# Patient Record
Sex: Male | Born: 1950 | Race: White | Hispanic: No | Marital: Married | Smoking: Former smoker
Health system: Southern US, Community
[De-identification: ages and names within clinical notes are randomized; demographics above are authoritative.]

## PROBLEM LIST (undated history)

## (undated) DIAGNOSIS — E785 Hyperlipidemia, unspecified: Secondary | ICD-10-CM

## (undated) DIAGNOSIS — H409 Unspecified glaucoma: Secondary | ICD-10-CM

## (undated) DIAGNOSIS — G8929 Other chronic pain: Secondary | ICD-10-CM

## (undated) DIAGNOSIS — H269 Unspecified cataract: Secondary | ICD-10-CM

## (undated) DIAGNOSIS — I739 Peripheral vascular disease, unspecified: Secondary | ICD-10-CM

## (undated) DIAGNOSIS — N189 Chronic kidney disease, unspecified: Secondary | ICD-10-CM

## (undated) DIAGNOSIS — I10 Essential (primary) hypertension: Secondary | ICD-10-CM

## (undated) DIAGNOSIS — Z8709 Personal history of other diseases of the respiratory system: Secondary | ICD-10-CM

## (undated) DIAGNOSIS — M549 Dorsalgia, unspecified: Secondary | ICD-10-CM

## (undated) DIAGNOSIS — R351 Nocturia: Secondary | ICD-10-CM

## (undated) HISTORY — DX: Essential (primary) hypertension: I10

## (undated) HISTORY — DX: Chronic kidney disease, unspecified: N18.9

## (undated) HISTORY — DX: Unspecified glaucoma: H40.9

## (undated) HISTORY — DX: Hyperlipidemia, unspecified: E78.5

---

## 2007-06-19 ENCOUNTER — Encounter: Admission: RE | Admit: 2007-06-19 | Discharge: 2007-06-19 | Payer: Self-pay | Admitting: Interventional Cardiology

## 2014-09-17 ENCOUNTER — Other Ambulatory Visit: Payer: Self-pay | Admitting: Family Medicine

## 2014-09-17 DIAGNOSIS — I739 Peripheral vascular disease, unspecified: Secondary | ICD-10-CM

## 2014-09-24 ENCOUNTER — Ambulatory Visit
Admission: RE | Admit: 2014-09-24 | Discharge: 2014-09-24 | Disposition: A | Discharge status: Home / Self Care | Payer: Self-pay | Source: Ambulatory Visit | Attending: Family Medicine | Admitting: Family Medicine

## 2014-09-24 DIAGNOSIS — I739 Peripheral vascular disease, unspecified: Secondary | ICD-10-CM

## 2014-10-16 ENCOUNTER — Encounter: Payer: Self-pay | Admitting: Vascular Surgery

## 2014-10-16 ENCOUNTER — Other Ambulatory Visit: Payer: Self-pay

## 2014-10-16 DIAGNOSIS — I739 Peripheral vascular disease, unspecified: Secondary | ICD-10-CM

## 2014-10-29 ENCOUNTER — Encounter: Payer: Self-pay | Admitting: Vascular Surgery

## 2014-11-01 ENCOUNTER — Encounter: Payer: Self-pay | Admitting: Vascular Surgery

## 2014-11-01 ENCOUNTER — Ambulatory Visit (HOSPITAL_COMMUNITY)
Admission: RE | Admit: 2014-11-01 | Discharge: 2014-11-01 | Disposition: A | Discharge status: Home / Self Care | Payer: 59 | Source: Ambulatory Visit | Attending: Vascular Surgery | Admitting: Vascular Surgery

## 2014-11-01 ENCOUNTER — Ambulatory Visit (INDEPENDENT_AMBULATORY_CARE_PROVIDER_SITE_OTHER): Payer: 59 | Admitting: Vascular Surgery

## 2014-11-01 VITALS — BP 147/80 | HR 94 | Ht 65.0 in | Wt 203.0 lb

## 2014-11-01 DIAGNOSIS — I739 Peripheral vascular disease, unspecified: Secondary | ICD-10-CM

## 2014-11-01 DIAGNOSIS — I70219 Atherosclerosis of native arteries of extremities with intermittent claudication, unspecified extremity: Secondary | ICD-10-CM

## 2014-11-01 NOTE — Progress Notes (Signed)
Referred by:  Johny Blamer, MD [id number] W. [address] [place], [place] [address]  Reason for referral: Left calf cramping  History of Present Illness  Robert [name] is a 64 y.o. (February 21, 1951) male who presents with chief complaint: left calf cramping.  Onset of symptom occurred years ago, without obvious trigger.  Patient initial attributed his leg pain to a herniated disc.  Pain is described as cramping in left calf with 100-150 yd walking, severity 3-6/10.  Patient has attempted to treat this pain with rest.  The patient has no rest pain symptoms also and no leg wounds/ulcers.  His intermittent claudication does not limit his ADL but he does feel it limits his quality of life.  Atherosclerotic risk factors include: glucose intolerance, HLD, HTN, and prior smoking.  Past Medical History  Diagnosis Date  . Hyperglycemia   . Hyperlipidemia   . Chronic kidney disease     Stage III  Moderate  . Dorsalgia   . Glaucoma   . Hypertension     Past Surgical History: no surgeries noted.  History   Social History  . Marital Status: Married    Spouse Name: N/A  . Number of Children: N/A  . Years of Education: N/A   Occupational History  . Not on file.   Social History Main Topics  . Smoking status: Former Smoker    Quit date: 08/15/2004  . Smokeless tobacco: Never Used  . Alcohol Use: 9.0 oz/week    15 Cans of beer per week  . Drug Use: No  . Sexual Activity: Not on file   Other Topics Concern  . Not on file   Social History Narrative    Family History  Problem Relation Age of Onset  . Hyperlipidemia Father   . Peripheral vascular disease Father     amputation  . Cancer Brother   . Hyperlipidemia Brother     Current Outpatient Prescriptions on File Prior to Visit  Medication Sig Dispense Refill  . aspirin 81 MG tablet Take 81 mg by mouth daily.    Marland Kitchen ezetimibe-simvastatin (VYTORIN) 10-40 MG per tablet Take 1 tablet by mouth daily.    . fenofibrate (TRICOR)  145 MG tablet Take 145 mg by mouth daily.    . traMADol (ULTRAM) 50 MG tablet Take by mouth 2 (two) times daily. For PAIN   PRN    . Travoprost, BAK Free, (TRAVATAN Z) 0.004 % SOLN ophthalmic solution Place 1 drop into both eyes at bedtime.    . valsartan-hydrochlorothiazide (DIOVAN-HCT) 320-25 MG per tablet Take 1 tablet by mouth daily.    . ciclopirox (LOPROX) 0.77 % cream Apply topically 2 (two) times daily. Gel     No current facility-administered medications on file prior to visit.    No Known Allergies  REVIEW OF SYSTEMS:  (Positives checked otherwise negative)  CARDIOVASCULAR:   chest pain,  chest pressure,  palpitations,  shortness of breath when laying flat,  shortness of breath with exertion,    pain in feet when walking,  pain in feet when laying flat,  history of blood clot in veins (DVT),  history of phlebitis,  swelling in legs,  varicose veins  PULMONARY:   productive cough,  asthma,  wheezing  NEUROLOGIC:   weakness in arms or legs,  numbness in arms or legs,  difficulty speaking or slurred speech,  temporary loss of  vision in one eye, [ ]  dizziness  HEMATOLOGIC:  [ ]  bleeding problems, [ ]  problems with blood clotting too easily  MUSCULOSKEL:  [ ]  joint pain, [ ]  joint swelling  GASTROINTEST:  [ ]   Vomiting blood, [ ]   Blood in stool     GENITOURINARY:  [ ]   Burning with urination, [ ]   Blood in urine  PSYCHIATRIC:  [ ]  history of major depression  INTEGUMENTARY:  [ ]  rashes, [ ]  ulcers  CONSTITUTIONAL:  [ ]  fever, [ ]  chills  For VQI Use Only  PRE-ADM LIVING: Home  AMB STATUS: Ambulatory  CAD Sx: None  PRIOR CHF: None  STRESS TEST: [x]  No, [ ]  Normal, [ ]  + ischemia, [ ]  + MI, [ ]  Both   Physical Examination Filed Vitals:   11/01/14 1341 11/01/14 1353  BP: 145/77 147/80  Pulse: 94   Height: 5\' 5"  (1.651 m)   Weight: 203 lb (92.08 kg)   SpO2: 97%    Body mass index is 33.78  kg/(m^2).  General: A&O x 3, WDWN  Head: [place]/AT  Ear/Nose/Throat: Hearing grossly intact, nares w/o erythema or drainage, oropharynx w/o Erythema/Exudate  Eyes: PERRLA, EOMI  Neck: Supple, no nuchal rigidity, no palpable LAD  Pulmonary: Sym exp, good air movt, CTAB, no rales, rhonchi, & wheezing  Cardiac: RRR, Nl S1, S2, no Murmurs, rubs or gallops  Vascular: Vessel Right Left  Radial Palpable Palpable  Brachial  Palpable Palpable  Carotid Palpable, without bruit Palpable, without bruit  Aorta Not palpable N/A  Femoral Palpable Faintly Palpable  Popliteal Not palpable Not palpable  PT Not Palpable Not Palpable  DP Faintly Palpable Not Palpable   Gastrointestinal: soft, NTND, -G/R, - HSM, - masses, - CVAT B  Musculoskeletal: M/S 5/5 throughout , Extremities without ischemic changes , spider veins BLE  Neurologic: CN 2-12 intact , Pain and light touch intact in extremities , Motor exam as listed above  Psychiatric: Judgment intact, Mood & affect appropriatefor pt's clinical situation  Dermatologic: See M/S exam for extremity exam, no rashes otherwise noted  Lymph : No Cervical, Axillary, or Inguinal lymphadenopathy   Non-Invasive Vascular Imaging  ABI (Date: 11/01/2014)  R: 0.77, DP: mono, PT: barely biphasic, TBI: 0.40  L: 0.44, DP: mono, PT: mono, TBI: 0.16  Outside Studies/Documentation 4 pages of outside documents were reviewed including: outpatient PCP chart.  Medical Decision Making  Robert [name] is a 64 y.o. male who presents with: BLE (L>R) intermittent claudication.   I discussed with the patient the natural history of intermittent claudication: 75% of patients have stable or improved symptoms in a year an only 2% require amputation. Eventually 20% may require intervention in a year.  I discussed in depth with the patient the nature of atherosclerosis, and emphasized the importance of maximal medical management including strict control of blood  pressure, blood glucose, and lipid levels, antiplatelet agent, obtaining regular exercise, and cessation of smoking.    The patient is aware that without maximal medical management the underlying atherosclerotic disease process will progress, limiting the benefit of any interventions.  I discussed in depth with the patient a walking plan and how to execute such.  The patient is not interested in starting Pletal. The patient is currently on a statin: Vytorin. The patient is currently on an anti-platelet: ASA.  The patient will follow up in 3 months with the following studies BLE ABI.    We discussed the signs and sx of CLI.  He  will follow up with [place] immediately if he develops any of these.  Thank you for allowing [place] to participate in this patient's care.  Leonides Sake, MD [facility] Office: [phone] Pager: [phone]  11/01/2014, 2:36 PM

## 2015-02-07 ENCOUNTER — Encounter (HOSPITAL_COMMUNITY): Payer: 59

## 2015-02-07 ENCOUNTER — Ambulatory Visit: Payer: 59 | Admitting: Vascular Surgery

## 2015-02-14 ENCOUNTER — Encounter (HOSPITAL_COMMUNITY): Payer: 59

## 2015-02-14 ENCOUNTER — Ambulatory Visit: Payer: 59 | Admitting: Vascular Surgery

## 2015-02-25 ENCOUNTER — Encounter: Payer: Self-pay | Admitting: Vascular Surgery

## 2015-02-25 ENCOUNTER — Other Ambulatory Visit: Payer: Self-pay | Admitting: *Deleted

## 2015-02-25 DIAGNOSIS — I70219 Atherosclerosis of native arteries of extremities with intermittent claudication, unspecified extremity: Secondary | ICD-10-CM

## 2015-02-28 ENCOUNTER — Encounter: Payer: Self-pay | Admitting: Vascular Surgery

## 2015-02-28 ENCOUNTER — Ambulatory Visit (INDEPENDENT_AMBULATORY_CARE_PROVIDER_SITE_OTHER): Payer: 59 | Admitting: Vascular Surgery

## 2015-02-28 ENCOUNTER — Other Ambulatory Visit: Payer: Self-pay

## 2015-02-28 ENCOUNTER — Ambulatory Visit (HOSPITAL_COMMUNITY)
Admission: RE | Admit: 2015-02-28 | Discharge: 2015-02-28 | Disposition: A | Discharge status: Home / Self Care | Payer: 59 | Source: Ambulatory Visit | Attending: Vascular Surgery | Admitting: Vascular Surgery

## 2015-02-28 VITALS — BP 147/85 | HR 84 | Temp 98.2°F | Resp 16 | Ht 66.0 in | Wt 207.0 lb

## 2015-02-28 DIAGNOSIS — I70219 Atherosclerosis of native arteries of extremities with intermittent claudication, unspecified extremity: Secondary | ICD-10-CM | POA: Diagnosis not present

## 2015-02-28 DIAGNOSIS — I70212 Atherosclerosis of native arteries of extremities with intermittent claudication, left leg: Secondary | ICD-10-CM | POA: Diagnosis not present

## 2015-02-28 NOTE — Progress Notes (Signed)
Established Intermittent Claudication  History of Present Illness  Robert [name] is a 64 y.o. ([birth year]) male who presents with chief complaint: left leg intermittent claudication .  The patient's symptoms have progressed.  The patient's symptoms are: short distance claudication.  He can only get 100 ft before he cramp significantly.  The patient's treatment regimen currently included: maximal medical management.  The patient is not able to comply with walking plan.  Past Medical History  Diagnosis Date  . Hyperglycemia   . Hyperlipidemia   . Chronic kidney disease     Stage III  Moderate  . Dorsalgia   . Glaucoma   . Hypertension     History reviewed. No pertinent past surgical history.  Social History   Social History  . Marital Status: Married    Spouse Name: N/A  . Number of Children: N/A  . Years of Education: N/A   Occupational History  . Not on file.   Social History Main Topics  . Smoking status: Former Smoker    Quit date: 08/15/2004  . Smokeless tobacco: Never Used  . Alcohol Use: 9.0 oz/week    15 Cans of beer per week  . Drug Use: No  . Sexual Activity: Not on file   Other Topics Concern  . Not on file   Social History Narrative    Family History  Problem Relation Age of Onset  . Hyperlipidemia Father   . Peripheral vascular disease Father     amputation  . Cancer Brother   . Hyperlipidemia Brother     Current Outpatient Prescriptions  Medication Sig Dispense Refill  . aspirin 81 MG tablet Take 81 mg by mouth daily.    . ciclopirox (LOPROX) 0.77 % cream Apply topically 2 (two) times daily. Gel    . Ciclopirox 0.77 % gel     . ezetimibe-simvastatin (VYTORIN) 10-40 MG per tablet Take 1 tablet by mouth daily.    . fenofibrate (TRICOR) 145 MG tablet Take 145 mg by mouth daily.    . traMADol (ULTRAM) 50 MG tablet Take by mouth 2 (two) times daily. For PAIN   PRN    . Travoprost, BAK Free, (TRAVATAN Z) 0.004 % SOLN ophthalmic solution Place  1 drop into both eyes at bedtime.    . travoprost, benzalkonium, (TRAVATAN) 0.004 % ophthalmic solution     . valsartan-hydrochlorothiazide (DIOVAN-HCT) 320-25 MG per tablet Take 1 tablet by mouth daily.     No current facility-administered medications for this visit.     No Known Allergies   REVIEW OF SYSTEMS:  (Positives checked otherwise negative)  CARDIOVASCULAR:   [ ]  chest pain,  [ ]  chest pressure,  [ ]  palpitations,  [ ]  shortness of breath when laying flat,  [ ]  shortness of breath with exertion,   [x]  pain in feet when walking,  [ ]  pain in feet when laying flat, [ ]  history of blood clot in veins (DVT),  [ ]  history of phlebitis,  [ ]  swelling in legs,  [ ]  varicose veins  PULMONARY:   [ ]  productive cough,  [ ]  asthma,  [ ]  wheezing  NEUROLOGIC:   [ ]  weakness in arms or legs,  [ ]  numbness in arms or legs,  [ ]  difficulty speaking or slurred speech,  [ ]  temporary loss of vision in one eye,  [ ]  dizziness  HEMATOLOGIC:   [ ]  bleeding problems,  [ ]  problems with blood clotting too easily  MUSCULOSKEL:   [ ]   joint pain, [ ] joint swelling  GASTROINTEST:   [ ] vomiting blood,  [ ] blood in stool     GENITOURINARY:   [ ] burning with urination,  [ ] blood in urine  PSYCHIATRIC:   [ ] history of major depression  INTEGUMENTARY:   [ ] rashes,  [ ] ulcers  CONSTITUTIONAL:   [ ] fever,  [ ] chills     Physical Examination  Filed Vitals:   02/28/15 1203 02/28/15 1206  BP: 154/79 147/85  Pulse: 82 84  Temp: 98.2 F (36.8 C)   Resp: 16   Height: 5' 6" (1.676 m)   Weight: 207 lb (93.895 kg)   SpO2: 98%    Body mass index is 33.43 kg/(m^2).  General: A&O x 3, WDWN  Pulmonary: Sym exp, good air movt, CTAB, no rales, rhonchi, & wheezing  Cardiac: RRR, Nl S1, S2, no Murmurs, rubs or gallops  Vascular: Vessel Right Left  Radial Palpable Palpable  Brachial Palpable Palpable  Carotid Palpable, without bruit Palpable, without bruit    Aorta Not palpable N/A  Femoral Palpable Palpable  Popliteal Not palpable Not palpable  PT Palpable Not Palpable  DP Palpable Not Palpable   Gastrointestinal: soft, NTND, no G/R, no HSM, no masses, no CVAT B  Musculoskeletal: M/S 5/5 throughout , Extremities without ischemic changes   Neurologic: Pain and light touch intact in extremities , Motor exam as listed above   Non-Invasive Vascular Imaging ABI (Date: 02/28/2015)  R:   ABI: 0.84 (0.71),   DP: bi,   PT: bi,   TBI: 0.63  L:   ABI: 0.54 (0.39),   DP: mono,   PT: mono,   TBI: 0.32   Medical Decision Making  Robert [name] is a 64 y.o. male who presents with:  Left leg intermittent claudication without evidence of critical limb ischemia.   Based on the patient's vascular studies and examination, I have offered the patient: aortogram , left leg runoff, and possible intervention.  I discussed in depth with the patient the nature of atherosclerosis, and emphasized the importance of maximal medical management including strict control of blood pressure, blood glucose, and lipid levels, antiplatelet agents, obtaining regular exercise, and cessation of smoking.    The patient is aware that without maximal medical management the underlying atherosclerotic disease process will progress, limiting the benefit of any interventions. The patient is currently on a statin: Vytorin. The patient is currently on an anti-platelet: ASA.  Thank you for allowing us to participate in this patient's care.   Brian Chen, MD [facility] Office: [phone] Pager: [phone]  02/28/2015, 1:23 PM      

## 2015-02-28 NOTE — Progress Notes (Signed)
Filed Vitals:   02/28/15 1203 02/28/15 1206  BP: 154/79 147/85  Pulse: 82 84  Temp: 98.2 F (36.8 C)   Resp: 16   Height: 5\' 6"  (1.676 m)   Weight: 207 lb (93.895 kg)   SpO2: 98%

## 2015-03-13 ENCOUNTER — Encounter (HOSPITAL_COMMUNITY): Payer: Self-pay | Admitting: Vascular Surgery

## 2015-03-13 ENCOUNTER — Other Ambulatory Visit: Payer: Self-pay | Admitting: *Deleted

## 2015-03-13 ENCOUNTER — Encounter (HOSPITAL_COMMUNITY)
Admission: RE | Disposition: A | Discharge status: Home / Self Care | Payer: Self-pay | Source: Ambulatory Visit | Attending: Vascular Surgery

## 2015-03-13 ENCOUNTER — Ambulatory Visit (HOSPITAL_COMMUNITY)
Admission: RE | Admit: 2015-03-13 | Discharge: 2015-03-13 | Disposition: A | Discharge status: Home / Self Care | Payer: 59 | Source: Ambulatory Visit | Attending: Vascular Surgery | Admitting: Vascular Surgery

## 2015-03-13 DIAGNOSIS — I7092 Chronic total occlusion of artery of the extremities: Secondary | ICD-10-CM | POA: Diagnosis not present

## 2015-03-13 DIAGNOSIS — Z7982 Long term (current) use of aspirin: Secondary | ICD-10-CM | POA: Diagnosis not present

## 2015-03-13 DIAGNOSIS — I70212 Atherosclerosis of native arteries of extremities with intermittent claudication, left leg: Secondary | ICD-10-CM | POA: Insufficient documentation

## 2015-03-13 DIAGNOSIS — I739 Peripheral vascular disease, unspecified: Secondary | ICD-10-CM

## 2015-03-13 DIAGNOSIS — E785 Hyperlipidemia, unspecified: Secondary | ICD-10-CM | POA: Diagnosis not present

## 2015-03-13 DIAGNOSIS — Z87891 Personal history of nicotine dependence: Secondary | ICD-10-CM | POA: Insufficient documentation

## 2015-03-13 DIAGNOSIS — I129 Hypertensive chronic kidney disease with stage 1 through stage 4 chronic kidney disease, or unspecified chronic kidney disease: Secondary | ICD-10-CM | POA: Diagnosis not present

## 2015-03-13 DIAGNOSIS — Z01810 Encounter for preprocedural cardiovascular examination: Secondary | ICD-10-CM

## 2015-03-13 DIAGNOSIS — N183 Chronic kidney disease, stage 3 (moderate): Secondary | ICD-10-CM | POA: Insufficient documentation

## 2015-03-13 DIAGNOSIS — I70213 Atherosclerosis of native arteries of extremities with intermittent claudication, bilateral legs: Secondary | ICD-10-CM | POA: Diagnosis not present

## 2015-03-13 DIAGNOSIS — I70219 Atherosclerosis of native arteries of extremities with intermittent claudication, unspecified extremity: Secondary | ICD-10-CM | POA: Diagnosis present

## 2015-03-13 HISTORY — PX: PERIPHERAL VASCULAR CATHETERIZATION: SHX172C

## 2015-03-13 LAB — POCT I-STAT, CHEM 8
BUN: 24 mg/dL — ABNORMAL HIGH (ref 6–20)
CALCIUM ION: 1.27 mmol/L (ref 1.13–1.30)
CREATININE: 1.6 mg/dL — AB (ref 0.61–1.24)
Chloride: 100 mmol/L — ABNORMAL LOW (ref 101–111)
GLUCOSE: 117 mg/dL — AB (ref 65–99)
HCT: 38 % — ABNORMAL LOW (ref 39.0–52.0)
HEMOGLOBIN: 12.9 g/dL — AB (ref 13.0–17.0)
Potassium: 4 mmol/L (ref 3.5–5.1)
Sodium: 136 mmol/L (ref 135–145)
TCO2: 23 mmol/L (ref 0–100)

## 2015-03-13 LAB — POCT ACTIVATED CLOTTING TIME: ACTIVATED CLOTTING TIME: 134 s

## 2015-03-13 LAB — GLUCOSE, CAPILLARY: GLUCOSE-CAPILLARY: 116 mg/dL — AB (ref 65–99)

## 2015-03-13 SURGERY — ABDOMINAL AORTOGRAM

## 2015-03-13 MED ORDER — NITROGLYCERIN 1 MG/10 ML FOR IR/CATH LAB
INTRA_ARTERIAL | Status: AC
  Filled 2015-03-13: qty 10

## 2015-03-13 MED ORDER — HEPARIN SODIUM (PORCINE) 1000 UNIT/ML IJ SOLN
INTRAMUSCULAR | Status: AC
  Filled 2015-03-13: qty 1

## 2015-03-13 MED ORDER — FENTANYL CITRATE (PF) 100 MCG/2ML IJ SOLN
INTRAMUSCULAR | Status: DC | PRN
  Administered 2015-03-13: 50 ug via INTRAVENOUS

## 2015-03-13 MED ORDER — SODIUM CHLORIDE 0.9 % IV SOLN
INTRAVENOUS | Status: DC
  Administered 2015-03-13: 07:00:00 via INTRAVENOUS

## 2015-03-13 MED ORDER — LIDOCAINE HCL (PF) 1 % IJ SOLN
INTRAMUSCULAR | Status: DC | PRN
  Administered 2015-03-13: 08:00:00

## 2015-03-13 MED ORDER — IODIXANOL 320 MG/ML IV SOLN
INTRAVENOUS | Status: DC | PRN
  Administered 2015-03-13: 135 mL via INTRA_ARTERIAL

## 2015-03-13 MED ORDER — OXYCODONE-ACETAMINOPHEN 5-325 MG PO TABS: 1.0000 | ORAL_TABLET | Freq: Four times a day (QID) | ORAL | Status: DC | PRN

## 2015-03-13 MED ORDER — MORPHINE SULFATE (PF) 2 MG/ML IV SOLN: 2.0000 mg | INTRAVENOUS | Status: DC | PRN

## 2015-03-13 MED ORDER — NITROGLYCERIN 1 MG/10 ML FOR IR/CATH LAB
INTRA_ARTERIAL | Status: DC | PRN
  Administered 2015-03-13: 200 ug via INTRA_ARTERIAL

## 2015-03-13 MED ORDER — LIDOCAINE HCL (PF) 1 % IJ SOLN
INTRAMUSCULAR | Status: AC
  Filled 2015-03-13: qty 30

## 2015-03-13 MED ORDER — SODIUM CHLORIDE 0.9 % IV SOLN: 1.0000 mL/kg/h | INTRAVENOUS | Status: DC

## 2015-03-13 MED ORDER — HEPARIN SODIUM (PORCINE) 1000 UNIT/ML IJ SOLN
INTRAMUSCULAR | Status: DC | PRN
  Administered 2015-03-13: 2000 [IU] via INTRAVENOUS

## 2015-03-13 MED ORDER — HEPARIN (PORCINE) IN NACL 2-0.9 UNIT/ML-% IJ SOLN
INTRAMUSCULAR | Status: AC
  Filled 2015-03-13: qty 1000

## 2015-03-13 MED ORDER — MIDAZOLAM HCL 2 MG/2ML IJ SOLN
INTRAMUSCULAR | Status: AC
  Filled 2015-03-13: qty 2

## 2015-03-13 MED ORDER — HYDRALAZINE HCL 20 MG/ML IJ SOLN: 10.0000 mg | INTRAMUSCULAR | Status: DC | PRN

## 2015-03-13 MED ORDER — MIDAZOLAM HCL 2 MG/2ML IJ SOLN
INTRAMUSCULAR | Status: DC | PRN
  Administered 2015-03-13: 1 mg via INTRAVENOUS

## 2015-03-13 MED ORDER — FENTANYL CITRATE (PF) 100 MCG/2ML IJ SOLN
INTRAMUSCULAR | Status: AC
  Filled 2015-03-13: qty 2

## 2015-03-13 SURGICAL SUPPLY — 13 items
CATH ANGIO 5F PIGTAIL 100CM (CATHETERS) ×2 IMPLANT
CATH OMNI FLUSH 5F 65CM (CATHETERS) ×2 IMPLANT
COVER PRB 48X5XTLSCP FOLD TPE (BAG) IMPLANT
COVER PROBE 5X48 (BAG) ×6
KIT MICROINTRODUCER STIFF 5F (SHEATH) ×2 IMPLANT
KIT PV (KITS) ×3 IMPLANT
SHEATH PINNACLE 5F 10CM (SHEATH) ×2 IMPLANT
STOPCOCK MORSE 400PSI 3WAY (MISCELLANEOUS) ×4 IMPLANT
SYR MEDRAD MARK V 150ML (SYRINGE) ×3 IMPLANT
TRANSDUCER W/STOPCOCK (MISCELLANEOUS) ×3 IMPLANT
TRAY PV CATH (CUSTOM PROCEDURE TRAY) ×3 IMPLANT
TUBING HIGH PRESSURE 120CM (CONNECTOR) ×4 IMPLANT
WIRE BENTSON .035X145CM (WIRE) ×2 IMPLANT

## 2015-03-13 NOTE — Discharge Instructions (Signed)
Angiogram, Care After °Refer to this sheet in the next few weeks. These instructions provide you with information about caring for yourself after your procedure. Your health care provider may also give you more specific instructions. Your treatment has been planned according to current medical practices, but problems sometimes occur. Call your health care provider if you have any problems or questions after your procedure. °WHAT TO EXPECT AFTER THE PROCEDURE °After your procedure, it is typical to have the following: °· Bruising at the catheter insertion site that usually fades within 1-2 weeks. °· Blood collecting in the tissue (hematoma) that may be painful to the touch. It should usually decrease in size and tenderness within 1-2 weeks. °HOME CARE INSTRUCTIONS °· Take medicines only as directed by your health care provider. °· You may shower 24-48 hours after the procedure or as directed by your health care provider. Remove the bandage (dressing) and gently wash the site with plain soap and water. Pat the area dry with a clean towel. Do not rub the site, because this may cause bleeding. °· Do not take baths, swim, or use a hot tub until your health care provider approves. °· Check your insertion site every day for redness, swelling, or drainage. °· Do not apply powder or lotion to the site. °· Do not lift over 10 lb (4.5 kg) for 5 days after your procedure or as directed by your health care provider. °· Ask your health care provider when it is okay to: °¨ Return to work or school. °¨ Resume usual physical activities or sports. °¨ Resume sexual activity. °· Do not drive home if you are discharged the same day as the procedure. Have someone else drive you. °· You may drive 24 hours after the procedure unless otherwise instructed by your health care provider. °· Do not operate machinery or power tools for 24 hours after the procedure or as directed by your health care provider. °· If your procedure was done as an  outpatient procedure, which means that you went home the same day as your procedure, a responsible adult should be with you for the first 24 hours after you arrive home. °· Keep all follow-up visits as directed by your health care provider. This is important. °SEEK MEDICAL CARE IF: °· You have a fever. °· You have chills. °· You have increased bleeding from the catheter insertion site. Hold pressure on the site and call 911. °SEEK IMMEDIATE MEDICAL CARE IF: °· You have unusual pain at the catheter insertion site. °· You have redness, warmth, or swelling at the catheter insertion site. °· You have drainage (other than a small amount of blood on the dressing) from the catheter insertion site. °· The catheter insertion site is bleeding, and the bleeding does not stop after 30 minutes of holding steady pressure on the site. °· The area near or just beyond the catheter insertion site becomes pale, cool, tingly, or numb. °  °This information is not intended to replace advice given to you by your health care provider. Make sure you discuss any questions you have with your health care provider. °  °Document Released: 10/15/2004 Document Revised: 04/19/2014 Document Reviewed: 08/30/2012 °Elsevier Interactive Patient Education ©2016 [employer] ° °

## 2015-03-13 NOTE — Interval H&P Note (Signed)
History and Physical Interval Note:  03/13/2015 7:16 AM  Robert [name]  has presented today for surgery, with the diagnosis of pvd with left lower extremity claudication  The various methods of treatment have been discussed with the patient and family. After consideration of risks, benefits and other options for treatment, the patient has consented to  Procedure(s): Abdominal Aortogram (N/A) as a surgical intervention .  The patient's history has been reviewed, patient examined, no change in status, stable for surgery.  I have reviewed the patient's chart and labs.  Questions were answered to the patient's satisfaction.     Leonides Sakehen, Percival Glasheen

## 2015-03-13 NOTE — Progress Notes (Signed)
Site area: Left brachial artery Site Prior to Removal:  Level 0 Pressure Applied For:25 min Manual:  yes  Patient Status During Pull:  stable Post Pull Site:  Level 0 Post Pull Instructions Given:  yes Post Pull Pulses Present: palpable Dressing Applied:  clear Bedrest begins @ 0925 till 1325 Comments:

## 2015-03-13 NOTE — H&P (View-Only) (Signed)
Established Intermittent Claudication  History of Present Illness  Robert [name] is a 64 y.o. ([birth year]) male who presents with chief complaint: left leg intermittent claudication .  The patient's symptoms have progressed.  The patient's symptoms are: short distance claudication.  He can only get 100 ft before he cramp significantly.  The patient's treatment regimen currently included: maximal medical management.  The patient is not able to comply with walking plan.  Past Medical History  Diagnosis Date  . Hyperglycemia   . Hyperlipidemia   . Chronic kidney disease     Stage III  Moderate  . Dorsalgia   . Glaucoma   . Hypertension     History reviewed. No pertinent past surgical history.  Social History   Social History  . Marital Status: Married    Spouse Name: N/A  . Number of Children: N/A  . Years of Education: N/A   Occupational History  . Not on file.   Social History Main Topics  . Smoking status: Former Smoker    Quit date: 08/15/2004  . Smokeless tobacco: Never Used  . Alcohol Use: 9.0 oz/week    15 Cans of beer per week  . Drug Use: No  . Sexual Activity: Not on file   Other Topics Concern  . Not on file   Social History Narrative    Family History  Problem Relation Age of Onset  . Hyperlipidemia Father   . Peripheral vascular disease Father     amputation  . Cancer Brother   . Hyperlipidemia Brother     Current Outpatient Prescriptions  Medication Sig Dispense Refill  . aspirin 81 MG tablet Take 81 mg by mouth daily.    . ciclopirox (LOPROX) 0.77 % cream Apply topically 2 (two) times daily. Gel    . Ciclopirox 0.77 % gel     . ezetimibe-simvastatin (VYTORIN) 10-40 MG per tablet Take 1 tablet by mouth daily.    . fenofibrate (TRICOR) 145 MG tablet Take 145 mg by mouth daily.    . traMADol (ULTRAM) 50 MG tablet Take by mouth 2 (two) times daily. For PAIN   PRN    . Travoprost, BAK Free, (TRAVATAN Z) 0.004 % SOLN ophthalmic solution Place  1 drop into both eyes at bedtime.    . travoprost, benzalkonium, (TRAVATAN) 0.004 % ophthalmic solution     . valsartan-hydrochlorothiazide (DIOVAN-HCT) 320-25 MG per tablet Take 1 tablet by mouth daily.     No current facility-administered medications for this visit.     No Known Allergies   REVIEW OF SYSTEMS:  (Positives checked otherwise negative)  CARDIOVASCULAR:   [ ]  chest pain,  [ ]  chest pressure,  [ ]  palpitations,  [ ]  shortness of breath when laying flat,  [ ]  shortness of breath with exertion,   [x]  pain in feet when walking,  [ ]  pain in feet when laying flat, [ ]  history of blood clot in veins (DVT),  [ ]  history of phlebitis,  [ ]  swelling in legs,  [ ]  varicose veins  PULMONARY:   [ ]  productive cough,  [ ]  asthma,  [ ]  wheezing  NEUROLOGIC:   [ ]  weakness in arms or legs,  [ ]  numbness in arms or legs,  [ ]  difficulty speaking or slurred speech,  [ ]  temporary loss of vision in one eye,  [ ]  dizziness  HEMATOLOGIC:   [ ]  bleeding problems,  [ ]  problems with blood clotting too easily  MUSCULOSKEL:   [ ]   joint pain,  joint swelling  GASTROINTEST:    vomiting blood,   blood in stool     GENITOURINARY:    burning with urination,   blood in urine  PSYCHIATRIC:    history of major depression  INTEGUMENTARY:    rashes,   ulcers  CONSTITUTIONAL:    fever,   chills     Physical Examination  Filed Vitals:   02/28/15 1203 02/28/15 1206  BP: 154/79 147/85  Pulse: 82 84  Temp: 98.2 F (36.8 C)   Resp: 16   Height:  (1.676 m)   Weight: 207 lb (93.895 kg)   SpO2: 98%    Body mass index is 33.43 kg/(m^2).  General: A&O x 3, WDWN  Pulmonary: Sym exp, good air movt, CTAB, no rales, rhonchi, & wheezing  Cardiac: RRR, Nl S1, S2, no Murmurs, rubs or gallops  Vascular: Vessel Right Left  Radial Palpable Palpable  Brachial Palpable Palpable  Carotid Palpable, without bruit Palpable, without bruit    Aorta Not palpable N/A  Femoral Palpable Palpable  Popliteal Not palpable Not palpable  PT Palpable Not Palpable  DP Palpable Not Palpable   Gastrointestinal: soft, NTND, no G/R, no HSM, no masses, no CVAT B  Musculoskeletal: M/S 5/5 throughout , Extremities without ischemic changes   Neurologic: Pain and light touch intact in extremities , Motor exam as listed above   Non-Invasive Vascular Imaging ABI (Date: 02/28/2015)  R:   ABI: 0.84 (0.71),   DP: bi,   PT: bi,   TBI: 0.63  L:   ABI: 0.54 (0.39),   DP: mono,   PT: mono,   TBI: 0.32   Medical Decision Making  Robert [name] is a 64 y.o. male who presents with:  Left leg intermittent claudication without evidence of critical limb ischemia.   Based on the patient's vascular studies and examination, I have offered the patient: aortogram , left leg runoff, and possible intervention.  I discussed in depth with the patient the nature of atherosclerosis, and emphasized the importance of maximal medical management including strict control of blood pressure, blood glucose, and lipid levels, antiplatelet agents, obtaining regular exercise, and cessation of smoking.    The patient is aware that without maximal medical management the underlying atherosclerotic disease process will progress, limiting the benefit of any interventions. The patient is currently on a statin: Vytorin. The patient is currently on an anti-platelet: ASA.  Thank you for allowing [place] to participate in this patient's care.   Leonides Sake, MD [facility] Office: [phone] Pager: [phone]  02/28/2015, 1:23 PM

## 2015-03-19 ENCOUNTER — Telehealth: Payer: Self-pay | Admitting: Vascular Surgery

## 2015-03-19 ENCOUNTER — Encounter: Payer: Self-pay | Admitting: Vascular Surgery

## 2015-03-19 NOTE — Telephone Encounter (Signed)
-----   Message from Sharee PimpleMarilyn K McChesney, RN sent at 03/13/2015 10:05 AM EST ----- Regarding: schedule   ----- Message -----    From: Fransisco HertzBrian L Chen, MD    Sent: 03/13/2015   9:12 AM      To: [facility] [address]  Clide DeutscherRobert W Speltz [id number] 1950/07/06  PROCEDURE: 1.  Left brachial artery cannulation under ultrasound guidance 2.  Intra-arterial administration of nitroglycerin 2.  Placement of catheter in aorta 3.  Aortogram 4.  Bilateral leg runoff via catheter   Orders(s) for follow-up:  1.  ASAP cardiology evaluation for aortobifemoral bypass 2.  Follow up 1-2 weeks after cardiology appt

## 2015-03-19 NOTE — Telephone Encounter (Signed)
Spoke with pts wife to confirm, dpm - °

## 2015-03-19 NOTE — Telephone Encounter (Signed)
-----   Message from Fransisco HertzBrian L Chen, MD sent at 03/17/2015  2:45 PM EST ----- Regarding: RE: schedule Yes  ----- Message -----    From: Fredrich Birksana P Millikan    Sent: 03/17/2015  11:46 AM      To: Fransisco HertzBrian L Chen, MD Subject: RE: schedule                                   Dr Imogene Burnhen,   [facility] is [facility]. Is this acceptable?  Thanks, Annabelle Harmanana ----- Message -----    From: Sharee PimpleMarilyn K McChesney, RN    Sent: 03/13/2015  10:05 AM      To: [facility] Subject: schedule                                         ----- Message -----    From: Fransisco HertzBrian L Chen, MD    Sent: 03/13/2015   9:12 AM      To: [facility] [address]  Robert DeutscherRobert [name] [id number] 11-Feb-1951  PROCEDURE: 1.  Left brachial artery cannulation under ultrasound guidance 2.  Intra-arterial administration of nitroglycerin 2.  Placement of catheter in aorta 3.  Aortogram 4.  Bilateral leg runoff via catheter   Orders(s) for follow-up:  1.  ASAP cardiology evaluation for aortobifemoral bypass 2.  Follow up 1-2 weeks after cardiology appt

## 2015-04-09 NOTE — Progress Notes (Signed)
Cardiology Office Note   Date:  04/10/2015   ID:  STEPHAN NELIS, DOB 06-11-1950, MRN [id number]  PCP:  Johny Blamer, MD  Cardiologist:   Madilyn Hook, MD   Chief Complaint  Patient presents with  . New Evaluation    Patient has no complaints.      History of Present Illness: Robert [name] is a 64 y.o. male with hyperlipidemia, hypertension, familial hypertriglyceridemia, CKD III, PVD, and glaucoma who presents for pre-surgical risk evaluation prior to aortobifemoral bypass.  Mr. Grzywacz underwent LE arteriography due to claudication and abnormal ABIs (L 0.54, R 0.84) on 12/1.  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  It was recommended taht he undergo aortobifemoral bypass with bilateral femoral endarterectomy and patch angioplasty.    Mr. Atiyeh denies chest pain or shortness of breath.  Two years ago he walked regularly.  However, he has been unable to walk more than 100 ft recently due to claudication.  He can walk up one flight of stairs without stopping and does not get chest pain or shortness of breath with this activity.  He likes to work in his yard.  He denies palpitations, lightheadedness, dizziness, lower extremity edema, orthopnea or PND.  His weight has been stable.    He quit smoking 10 years ago when his wife developed lung cancer.  He states that his blood pressure is typically 140/70.   His primary care physician checks his lipids every 6 months.  He states that hypertriglyceridemia. His lipids have been well controlled recently.  Past Medical History  Diagnosis Date  . Hyperglycemia   . Hyperlipidemia   . Chronic kidney disease     Stage III  Moderate  . Dorsalgia   . Glaucoma   . Hypertension   . Hypertension 04/10/2015  . Hyperlipidemia 04/10/2015  . Hypertriglyceridemia 04/10/2015    Past Surgical History  Procedure Laterality Date  . Peripheral vascular catheterization N/A 03/13/2015     Procedure: Abdominal Aortogram;  Surgeon: Fransisco Hertz, MD;  Location: [facility] INVASIVE CV LAB;  Service: Cardiovascular;  Laterality: N/A;     Current Outpatient Prescriptions  Medication Sig Dispense Refill  . aspirin 81 MG tablet Take 81 mg by mouth daily.    . ciclopirox (LOPROX) 0.77 % cream Apply topically 2 (two) times daily. Gel    . Ciclopirox 0.77 % gel     . ezetimibe-simvastatin (VYTORIN) 10-40 MG per tablet Take 1 tablet by mouth daily.    . fenofibrate (TRICOR) 145 MG tablet Take 145 mg by mouth daily.    . traMADol (ULTRAM) 50 MG tablet Take by mouth 2 (two) times daily. For PAIN   PRN    . travoprost, benzalkonium, (TRAVATAN) 0.004 % ophthalmic solution Place 1 drop into both eyes at bedtime.     . valsartan-hydrochlorothiazide (DIOVAN-HCT) 320-25 MG per tablet Take 1 tablet by mouth daily.     No current facility-administered medications for this visit.    Allergies:   Review of patient's allergies indicates no known allergies.    Social History:  The patient  reports that he quit smoking about 10 years ago. He has never used smokeless tobacco. He reports that he drinks about 9.0 oz of alcohol per week. He reports that he does not use illicit drugs.   Family History:  The patient's family history includes Cancer in his brother; Hyperlipidemia in his brother and father; Peripheral vascular disease in his father.  There is no history of Heart disease.    ROS:  Please see the history of present illness.   Otherwise, review of systems are positive for none.   All other systems are reviewed and negative.    PHYSICAL EXAM: VS:  BP 148/62 mmHg  Pulse 93  Ht  (1.651 m)  Wt 94.802 kg (209 lb)  BMI 34.78 kg/m2 , BMI Body mass index is 34.78 kg/(m^2). GENERAL:  Well appearing HEENT:  Pupils equal round and reactive, fundi not visualized, oral mucosa unremarkable NECK:  No jugular venous distention, waveform within normal limits, carotid upstroke brisk and symmetric, no  bruits, no thyromegaly LYMPHATICS:  No cervical adenopathy LUNGS:  Clear to auscultation bilaterally HEART:  RRR.  PMI not displaced or sustained,S1 and S2 within normal limits, no S3, no S4, no clicks, no rubs, no murmurs ABD:  Flat, positive bowel sounds normal in frequency in pitch, no bruits, no rebound, no guarding, no midline pulsatile mass, no hepatomegaly, no splenomegaly EXT:  1+ R TP, 0 R DP, L DP/TP, no edema, no cyanosis no clubbing SKIN:  No rashes no nodules NEURO:  Cranial nerves II through XII grossly intact, motor grossly intact throughout PSYCH:  Cognitively intact, oriented to person place and time    EKG:  EKG is not ordered today. The ekg ordered today demonstrates sinus rhythm rate 83 bpm. Low voltage in precordial leads.   Recent Labs: 03/13/2015: BUN 24*; Creatinine, Ser 1.60*; Hemoglobin 12.9*; Potassium 4.0; Sodium 136    Lipid Panel No results found for: CHOL, TRIG, HDL, CHOLHDL, VLDL, LDLCALC, LDLDIRECT    Wt Readings from Last 3 Encounters:  04/10/15 94.802 kg (209 lb)  03/13/15 90.719 kg (200 lb)  02/28/15 93.895 kg (207 lb)    ABI (Date: 02/28/2015)  R:   ABI: 0.84 (0.71),   DP: bi,   PT: bi,   TBI: 0.63  L:   ABI: 0.54 (0.39),   DP: mono,   PT: mono,   TBI: 0.32  Abdominal aortogram 03/13/15 FINDING(S):  Aorta: patent  Superior mesenteric artery: patent  Celiac artery: patent   Right Left  RA patent patent  CIA Patent, calcified Occluded, residual nub  EIA Patent, calcified, 50-75% stenosis proximally (3.5 mm lumen) Occluded  IIA Patent Reconstituted from collaterals  CFA Patent, extensive calcified disease  Patent, extensive calcified disease  SFA Patent Patent, underfilled possible proximal stenosis  PFA Patent Patent, underfilled  Pop Patent Patent, underfilled  Trif Patent Patent, underfilled  AT Patent Patent, underfilled  Pero Patent Patent, underfilled  PT Patent  Patent, underfilled       ASSESSMENT AND PLAN:  # Pre-surgical risk assessment: The patient does not have any unstable cardiac conditions.  Upon evaluation today, he can achieve 4 METs or greater without anginal symptoms.  According to [facility] and AHA guidelines, he requires no further cardiac workup prior to his noncardiac surgery and should be at acceptable risk.  his NSQIP risk of peri-procedural MI or cardiac arrest is 1.15%.  Our service is available as necessary in the perioperative period.  Continue aspirin.  He was provided with a letter for surgical clearance today.  # Hypertension: Blood pressure is slightly above goal today.  He states that it is typically well controlled. He took his medicine just before coming to the office. Therefore we will not make any changes to his valsartan and hydrochlorothiazide.  # Hyperlipidemia, hypertriglyceridemia: Managed by PCP.  Well-controlled per patient.  Continue ezetimibe, simvastatin and fenofibrate.  Current medicines are reviewed at length with the patient today.  The patient does not have concerns regarding medicines.  The following changes have been made:  no change  Labs/ tests ordered today include:  No orders of the defined types were placed in this encounter.     Disposition:   FU with Rylann Munford C. Duke Salviaandolph, MD, [facility] in 1 year.    Signed, Tashonda Pinkus C. Duke Salviaandolph, MD, [facility]  04/10/2015 8:42 AM    [facility]

## 2015-04-10 ENCOUNTER — Ambulatory Visit: Payer: 59 | Admitting: Cardiovascular Disease

## 2015-04-10 ENCOUNTER — Ambulatory Visit (INDEPENDENT_AMBULATORY_CARE_PROVIDER_SITE_OTHER): Payer: 59 | Admitting: Cardiovascular Disease

## 2015-04-10 ENCOUNTER — Encounter: Payer: Self-pay | Admitting: Cardiovascular Disease

## 2015-04-10 ENCOUNTER — Encounter: Payer: Self-pay | Admitting: Vascular Surgery

## 2015-04-10 VITALS — BP 148/62 | HR 93 | Ht 65.0 in | Wt 209.0 lb

## 2015-04-10 DIAGNOSIS — E781 Pure hyperglyceridemia: Secondary | ICD-10-CM

## 2015-04-10 DIAGNOSIS — N183 Chronic kidney disease, stage 3 unspecified: Secondary | ICD-10-CM | POA: Insufficient documentation

## 2015-04-10 DIAGNOSIS — I10 Essential (primary) hypertension: Secondary | ICD-10-CM | POA: Diagnosis not present

## 2015-04-10 DIAGNOSIS — E785 Hyperlipidemia, unspecified: Secondary | ICD-10-CM

## 2015-04-10 DIAGNOSIS — H409 Unspecified glaucoma: Secondary | ICD-10-CM

## 2015-04-10 HISTORY — DX: Essential (primary) hypertension: I10

## 2015-04-10 NOTE — Patient Instructions (Signed)
Dr Duke Salviaandolph has made no changes today in your current medications or treatment plan.  **You have been cleared for surgery.  Your physician recommends that you schedule a follow-up appointment in 1 year. You will receive a reminder letter in the mail two months in advance. If you don't receive a letter, please call our office to schedule the follow-up appointment.  If you need a refill on your cardiac medications before your next appointment, please call your pharmacy.

## 2015-04-11 ENCOUNTER — Ambulatory Visit: Payer: 59 | Admitting: Vascular Surgery

## 2015-04-18 ENCOUNTER — Encounter: Payer: Self-pay | Admitting: Vascular Surgery

## 2015-04-18 ENCOUNTER — Ambulatory Visit (INDEPENDENT_AMBULATORY_CARE_PROVIDER_SITE_OTHER): Payer: 59 | Admitting: Vascular Surgery

## 2015-04-18 VITALS — BP 150/63 | HR 88 | Temp 98.0°F | Resp 20 | Ht 66.0 in | Wt 207.8 lb

## 2015-04-18 DIAGNOSIS — I745 Embolism and thrombosis of iliac artery: Secondary | ICD-10-CM | POA: Diagnosis not present

## 2015-04-18 DIAGNOSIS — I70211 Atherosclerosis of native arteries of extremities with intermittent claudication, right leg: Secondary | ICD-10-CM | POA: Insufficient documentation

## 2015-04-18 DIAGNOSIS — I70212 Atherosclerosis of native arteries of extremities with intermittent claudication, left leg: Secondary | ICD-10-CM | POA: Insufficient documentation

## 2015-04-18 NOTE — Progress Notes (Addendum)
Postoperative Visit   History of Present Illness  Robert DeutscherRobert [name] is a 65 y.o. male who presents for postoperative follow-up from procedure on Date: 03/13/15: L brachial cannulation, Ao, BRo .  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  The patient was sent to cardiology for risk stratification and has been found to be low risk.  Past Medical History  Diagnosis Date  . Hyperglycemia   . Hyperlipidemia   . Chronic kidney disease     Stage III  Moderate  . Dorsalgia   . Glaucoma   . Hypertension   . Hypertension 04/10/2015  . Hyperlipidemia 04/10/2015  . Hypertriglyceridemia 04/10/2015    Past Surgical History  Procedure Laterality Date  . Peripheral vascular catheterization N/A 03/13/2015    Procedure: Abdominal Aortogram;  Surgeon: Fransisco HertzBrian L Caylan Schifano, MD;  [facility];  Service: Cardiovascular;  Laterality: N/A;    Social History   Social History  . Marital Status: Married    Spouse Name: N/A  . Number of Children: N/A  . Years of Education: N/A   Occupational History  . Not on file.   Social History Main Topics  . Smoking status: Former Smoker    Quit date: 08/15/2004  . Smokeless tobacco: Never Used  . Alcohol Use: 9.0 oz/week    15 Cans of beer per week  . Drug Use: No  . Sexual Activity: Not on file   Other Topics Concern  . Not on file   Social History Narrative   Epworth Sleepiness Scale = (as of 04/10/2015)    Family History  Problem Relation Age of Onset  . Hyperlipidemia Father   . Peripheral vascular disease Father     amputation  . Cancer Brother     colon/bowel cancer  . Hyperlipidemia Brother   . Cancer Mother   . Liver disease Mother   . Cancer Maternal Grandmother   . Heart disease Paternal Grandmother   . Diabetes Paternal Grandmother     Current Outpatient Prescriptions  Medication Sig Dispense Refill  . aspirin 81 MG tablet Take 81 mg by mouth daily.    . ciclopirox  (LOPROX) 0.77 % cream Apply topically 2 (two) times daily. Gel    . Ciclopirox 0.77 % gel     . ezetimibe-simvastatin (VYTORIN) 10-40 MG per tablet Take 1 tablet by mouth daily.    . fenofibrate (TRICOR) 145 MG tablet Take 145 mg by mouth daily.    . traMADol (ULTRAM) 50 MG tablet Take by mouth 2 (two) times daily. For PAIN   PRN    . Travoprost, BAK Free, (TRAVATAN Z) 0.004 % SOLN ophthalmic solution 1 drop at bedtime.    . valsartan-hydrochlorothiazide (DIOVAN-HCT) 320-25 MG per tablet Take 1 tablet by mouth daily.    . travoprost, benzalkonium, (TRAVATAN) 0.004 % ophthalmic solution Place 1 drop into both eyes at bedtime. Reported on 04/18/2015     No current facility-administered medications for this visit.     No Known Allergies   REVIEW OF SYSTEMS:  (Positives checked otherwise negative)  CARDIOVASCULAR:   [ ]  chest pain,  [ ]  chest pressure,  [ ]  palpitations,  [ ]  shortness of breath when laying flat,  [ ]  shortness of breath with exertion,   [x]  pain in feet when walking,  [ ]  pain in feet when laying flat, [ ]  history of blood clot in veins (DVT),  [ ]  history of phlebitis,  [ ]   swelling in legs,  [ ]  varicose veins  PULMONARY:   [ ]  productive cough,  [ ]  asthma,  [ ]  wheezing  NEUROLOGIC:   [ ]  weakness in arms or legs,  [ ]  numbness in arms or legs,  [ ]  difficulty speaking or slurred speech,  [ ]  temporary loss of vision in one eye,  [ ]  dizziness  HEMATOLOGIC:   [ ]  bleeding problems,  [ ]  problems with blood clotting too easily  MUSCULOSKEL:   [ ]  joint pain, [ ]  joint swelling  GASTROINTEST:   [ ]  vomiting blood,  [ ]  blood in stool     GENITOURINARY:   [ ]  burning with urination,  [ ]  blood in urine  PSYCHIATRIC:   [ ]  history of major depression  INTEGUMENTARY:   [ ]  rashes,  [ ]  ulcers  CONSTITUTIONAL:   [ ]  fever,  [ ]  chills    Physical Examination  Filed Vitals:   04/18/15 0908  BP: 150/63  Pulse: 88  Temp: 98 F (36.7  C)  TempSrc: Oral  Resp: 20  Height: 5\' 6"  (1.676 m)  Weight: 207 lb 12.8 oz (94.257 kg)   Body mass index is 33.56 kg/(m^2).  General: A&O x 3, WDWN  Pulmonary: Sym exp, good air movt, CTAB, no rales, rhonchi, & wheezing  Cardiac: RRR, Nl S1, S2, no Murmurs, rubs or gallops  Vascular: Vessel Right Left  Radial Palpable Palpable  Ulnar Palpable Palpable  Brachial Palpable Palpable  Carotid Palpable, without bruit Palpable, without bruit  Aorta Not palpable N/A  Femoral Palpable Palpable  Popliteal Not palpable Not palpable  PT Not Palpable Not Palpable  DP Not Palpable Not Palpable    Gastrointestinal: soft, NTND, -G/R, - HSM, - masses, - CVAT B  Musculoskeletal: M/S 5/5 throughout , Extremities without ischemic changes , L antecubitum without hematoma, no echymosis present at cannulation site  Neurologic:  Pain and light touch intact in extremities , Motor exam as listed above  Medical Decision Making  Robert [name] is a 65 y.o. male who presents s/p L brachial access, Aortogram with bilateral leg runoff for L chronic CIA occlusion, L EIA high grade stenosis resulting in B leg intermittent claudication   Based on his angiographic findings, this patient needs: Aortobifemoral bypass with possible bilateral femoral endarterectomy. The patient is aware the risks of aortic surgery include but are not limited to: bleeding, need for transfusion, infection, death, stroke, paralysis, wound complications, bowel injuries, impotence, bowel ischemia, extended ventilation and future ventral hernias.   Overall, I cited a mortality rate of 5-10% and morbidity rate of 30%. I discussed in depth with the patient the nature of atherosclerosis, and emphasized the importance of maximal medical management including strict control of blood pressure, blood glucose, and lipid levels, obtaining regular exercise, and cessation of smoking.  The patient is aware that without maximal medical management  the underlying atherosclerotic disease process will progress, limiting the benefit of any interventions. The patient is currently on a statin: Vytorin. The patient is currently on an anti-platelet: ASA.  Thank you for allowing [place] to participate in this patient's care.  Leonides Sake, MD [facility] Office: [phone] Pager: [phone]  04/18/2015, 10:05 AM

## 2015-04-25 ENCOUNTER — Other Ambulatory Visit: Payer: Self-pay

## 2015-05-09 ENCOUNTER — Encounter (HOSPITAL_COMMUNITY)
Admission: RE | Admit: 2015-05-09 | Discharge: 2015-05-09 | Disposition: A | Discharge status: Home / Self Care | Payer: 59 | Source: Ambulatory Visit | Attending: Vascular Surgery | Admitting: Vascular Surgery

## 2015-05-09 ENCOUNTER — Encounter (HOSPITAL_COMMUNITY): Payer: Self-pay

## 2015-05-09 DIAGNOSIS — Z0183 Encounter for blood typing: Secondary | ICD-10-CM | POA: Insufficient documentation

## 2015-05-09 DIAGNOSIS — I739 Peripheral vascular disease, unspecified: Secondary | ICD-10-CM | POA: Diagnosis not present

## 2015-05-09 DIAGNOSIS — Z01812 Encounter for preprocedural laboratory examination: Secondary | ICD-10-CM | POA: Diagnosis present

## 2015-05-09 HISTORY — DX: Peripheral vascular disease, unspecified: I73.9

## 2015-05-09 HISTORY — DX: Unspecified cataract: H26.9

## 2015-05-09 HISTORY — DX: Nocturia: R35.1

## 2015-05-09 HISTORY — DX: Personal history of other diseases of the respiratory system: Z87.09

## 2015-05-09 HISTORY — DX: Other chronic pain: G89.29

## 2015-05-09 HISTORY — DX: Dorsalgia, unspecified: M54.9

## 2015-05-09 LAB — CBC
HCT: 36.8 % — ABNORMAL LOW (ref 39.0–52.0)
HEMOGLOBIN: 12.5 g/dL — AB (ref 13.0–17.0)
MCH: 31 pg (ref 26.0–34.0)
MCHC: 34 g/dL (ref 30.0–36.0)
MCV: 91.3 fL (ref 78.0–100.0)
Platelets: 245 10*3/uL (ref 150–400)
RBC: 4.03 MIL/uL — AB (ref 4.22–5.81)
RDW: 12.8 % (ref 11.5–15.5)
WBC: 8.8 10*3/uL (ref 4.0–10.5)

## 2015-05-09 LAB — BLOOD GAS, ARTERIAL
ACID-BASE DEFICIT: 0.9 mmol/L (ref 0.0–2.0)
BICARBONATE: 22.7 meq/L (ref 20.0–24.0)
Drawn by: 206361
FIO2: 0.21
O2 SAT: 97.1 %
PCO2 ART: 34.2 mmHg — AB (ref 35.0–45.0)
PO2 ART: 92.7 mmHg (ref 80.0–100.0)
Patient temperature: 98.6
TCO2: 23.7 mmol/L (ref 0–100)
pH, Arterial: 7.437 (ref 7.350–7.450)

## 2015-05-09 LAB — COMPREHENSIVE METABOLIC PANEL
ALK PHOS: 29 U/L — AB (ref 38–126)
ALT: 19 U/L (ref 17–63)
ANION GAP: 13 (ref 5–15)
AST: 30 U/L (ref 15–41)
Albumin: 4 g/dL (ref 3.5–5.0)
BUN: 20 mg/dL (ref 6–20)
CALCIUM: 9.9 mg/dL (ref 8.9–10.3)
CO2: 19 mmol/L — ABNORMAL LOW (ref 22–32)
CREATININE: 1.51 mg/dL — AB (ref 0.61–1.24)
Chloride: 103 mmol/L (ref 101–111)
GFR, EST AFRICAN AMERICAN: 55 mL/min — AB (ref >60)
GFR, EST NON AFRICAN AMERICAN: 47 mL/min — AB (ref >60)
Glucose, Bld: 112 mg/dL — ABNORMAL HIGH (ref 65–99)
Potassium: 4.5 mmol/L (ref 3.5–5.1)
Sodium: 135 mmol/L (ref 135–145)
TOTAL PROTEIN: 7.1 g/dL (ref 6.5–8.1)
Total Bilirubin: 0.5 mg/dL (ref 0.3–1.2)

## 2015-05-09 LAB — PROTIME-INR
INR: 1.08 (ref 0.00–1.49)
PROTHROMBIN TIME: 14.2 s (ref 11.6–15.2)

## 2015-05-09 LAB — URINALYSIS, ROUTINE W REFLEX MICROSCOPIC
Bilirubin Urine: NEGATIVE
GLUCOSE, UA: NEGATIVE mg/dL
Hgb urine dipstick: NEGATIVE
Ketones, ur: NEGATIVE mg/dL
LEUKOCYTES UA: NEGATIVE
Nitrite: NEGATIVE
PH: 6.5 (ref 5.0–8.0)
PROTEIN: NEGATIVE mg/dL
SPECIFIC GRAVITY, URINE: 1.011 (ref 1.005–1.030)

## 2015-05-09 LAB — APTT: APTT: 30 s (ref 24–37)

## 2015-05-09 LAB — SURGICAL PCR SCREEN
MRSA, PCR: NEGATIVE
Staphylococcus aureus: POSITIVE — AB

## 2015-05-09 LAB — ABO/RH: ABO/RH(D): A POS

## 2015-05-09 MED ORDER — CHLORHEXIDINE GLUCONATE CLOTH 2 % EX PADS: 6.0000 | MEDICATED_PAD | Freq: Once | CUTANEOUS | Status: DC

## 2015-05-09 NOTE — Progress Notes (Signed)
[facility] on [address] [facility]

## 2015-05-09 NOTE — Progress Notes (Signed)
   05/09/15 0905  OBSTRUCTIVE SLEEP APNEA  Have you ever been diagnosed with sleep apnea through a sleep study? No  Do you snore loudly (loud enough to be heard through closed doors)?  1  Do you often feel tired, fatigued, or sleepy during the daytime (such as falling asleep during driving or talking to someone)? 0  Has anyone observed you stop breathing during your sleep? 1  Do you have, or are you being treated for high blood pressure? 1  BMI more than 35 kg/m2? 0  Age > 50 (1-yes) 1  Neck circumference greater than:Male 16 inches or larger, Male 17inches or larger? 1 (19.5)  Male Gender (Yes=1) 1  Obstructive Sleep Apnea Score 6  Score 5 or greater  Results sent to PCP

## 2015-05-09 NOTE — Pre-Procedure Instructions (Signed)
DECODA VAN  05/09/2015      [id number] [place], [place] - [address] [place] [address] Phone: [phone] Fax: [phone]    Your procedure is scheduled on Wed, Feb 1 @ 8:30 AM.  Report to [facility] Admitting at 6:30 AM  Call this number if you have problems the morning of surgery:  [phone]   Remember:  Do not eat food or drink liquids after midnight.  Take these medicines the morning of surgery with A SIP OF WATER Tramadol(Ultram-if needed)             No Goody's,BC's,Aleve,Advil,Motrin,Ibuprofen,Fish Oil,or any Herbal Medications.    Do not wear jewelry.  Do not wear lotions, powders, or colognes.  You may wear deodorant.  Men may shave face and neck.  Do not bring valuables to the hospital.  [facility] is not responsible for any belongings or valuables.  Contacts, dentures or bridgework may not be worn into surgery.  Leave your suitcase in the car.  After surgery it may be brought to your room.  For patients admitted to the hospital, discharge time will be determined by your treatment team.  Patients discharged the day of surgery will not be allowed to drive home.    Special instructions:  [place] - Preparing for Surgery  Before surgery, you can play an important role.  Because skin is not sterile, your skin needs to be as free of germs as possible.  You can reduce the number of germs on you skin by washing with CHG (chlorahexidine gluconate) soap before surgery.  CHG is an antiseptic cleaner which kills germs and bonds with the skin to continue killing germs even after washing.  Please DO NOT use if you have an allergy to CHG or antibacterial soaps.  If your skin becomes reddened/irritated stop using the CHG and inform your nurse when you arrive at Short Stay.  Do not shave (including legs and underarms) for at least 48 hours prior to the first CHG shower.  You may shave  your face.  Please follow these instructions carefully:   1.  Shower with CHG Soap the night before surgery and the                                morning of Surgery.  2.  If you choose to wash your hair, wash your hair first as usual with your       normal shampoo.  3.  After you shampoo, rinse your hair and body thoroughly to remove the                      Shampoo.  4.  Use CHG as you would any other liquid soap.  You can apply chg directly       to the skin and wash gently with scrungie or a clean washcloth.  5.  Apply the CHG Soap to your body ONLY FROM THE NECK DOWN.        Do not use on open wounds or open sores.  Avoid contact with your eyes,       ears, mouth and genitals (private parts).  Wash genitals (private parts)       with your normal soap.  6.  Wash thoroughly, paying special attention to the area where your surgery  will be performed.  7.  Thoroughly rinse your body with warm water from the neck down.  8.  DO NOT shower/wash with your normal soap after using and rinsing off       the CHG Soap.  9.  Pat yourself dry with a clean towel.            10.  Wear clean pajamas.            11.  Place clean sheets on your bed the night of your first shower and do not        sleep with pets.  Day of Surgery  Do not apply any lotions/deoderants the morning of surgery.  Please wear clean clothes to the hospital/surgery center.    Please read over the following fact sheets that you were given. Pain Booklet, Coughing and Deep Breathing, Blood Transfusion Information, MRSA Information and Surgical Site Infection Prevention

## 2015-05-09 NOTE — Progress Notes (Addendum)
Cardiologist is Dr.[place]-clearance in epic from 04-09-15  Medical Md is Dr.[name] Tiburcio Pea  Echo denies  Stress test 8-9 yrs ago  Heart cath denies  EKG in epic from 03-13-15  CXR denies having in past yr

## 2015-05-20 MED ORDER — SODIUM CHLORIDE 0.9 % IV SOLN: INTRAVENOUS | Status: DC

## 2015-05-20 MED ORDER — DEXTROSE 5 % IV SOLN
1.5000 g | INTRAVENOUS | Status: AC
  Administered 2015-05-21 (×2): 1.5 g via INTRAVENOUS
  Filled 2015-05-20: qty 1.5

## 2015-05-20 NOTE — Progress Notes (Signed)
Pt notified of time of arrival for surgery tomorrow. He voiced understanding.

## 2015-05-20 NOTE — Anesthesia Preprocedure Evaluation (Addendum)
Anesthesia Evaluation  Patient identified by MRN, date of birth, ID band Patient awake    Reviewed: Allergy & Precautions, NPO status , Patient's Chart, lab work & pertinent test results  Airway Mallampati: III  TM Distance: >3 FB Neck ROM: Full    Dental  (+) Teeth Intact, Dental Advisory Given   Pulmonary former smoker,    breath sounds clear to auscultation- rhonchi       Cardiovascular hypertension, Pt. on medications + Peripheral Vascular Disease   Rhythm:Regular Rate:Normal     Neuro/Psych negative neurological ROS  negative psych ROS   GI/Hepatic   Endo/Other    Renal/GU CRFRenal disease  negative genitourinary   Musculoskeletal negative musculoskeletal ROS (+)   Abdominal   Peds negative pediatric ROS (+)  Hematology negative hematology ROS (+)   Anesthesia Other Findings - HLD  Reproductive/Obstetrics negative OB ROS                           Lab Results  Component Value Date   WBC 8.8 05/09/2015   HGB 12.5* 05/09/2015   HCT 36.8* 05/09/2015   MCV 91.3 05/09/2015   PLT 245 05/09/2015   Lab Results  Component Value Date   CREATININE 1.51* 05/09/2015   BUN 20 05/09/2015   NA 135 05/09/2015   K 4.5 05/09/2015   CL 103 05/09/2015   CO2 19* 05/09/2015   Lab Results  Component Value Date   INR 1.08 05/09/2015   03/2015 EKG: NSR   Anesthesia Physical Anesthesia Plan  ASA: III  Anesthesia Plan: General   Post-op Pain Management:    Induction: Intravenous  Airway Management Planned: Oral ETT and Video Laryngoscope Planned  Additional Equipment: Arterial line and CVP  Intra-op Plan:   Post-operative Plan: Extubation in OR  Informed Consent: I have reviewed the patients History and Physical, chart, labs and discussed the procedure including the risks, benefits and alternatives for the proposed anesthesia with the patient or authorized representative who has  indicated his/her understanding and acceptance.   Dental advisory given  Plan Discussed with: CRNA, Anesthesiologist and Surgeon  Anesthesia Plan Comments:        Anesthesia Quick Evaluation

## 2015-05-21 ENCOUNTER — Inpatient Hospital Stay (HOSPITAL_COMMUNITY)
Admission: RE | Admit: 2015-05-21 | Discharge: 2015-06-01 | DRG: 270 | Disposition: A | Discharge status: Home Health | Payer: 59 | Source: Ambulatory Visit | Attending: Vascular Surgery | Admitting: Vascular Surgery

## 2015-05-21 ENCOUNTER — Encounter (HOSPITAL_COMMUNITY)
Admission: RE | Disposition: A | Discharge status: Home Health | Payer: Self-pay | Source: Ambulatory Visit | Attending: Vascular Surgery

## 2015-05-21 ENCOUNTER — Inpatient Hospital Stay (HOSPITAL_COMMUNITY): Payer: 59 | Admitting: Emergency Medicine

## 2015-05-21 ENCOUNTER — Inpatient Hospital Stay (HOSPITAL_COMMUNITY): Payer: 59 | Admitting: Certified Registered"

## 2015-05-21 ENCOUNTER — Inpatient Hospital Stay (HOSPITAL_COMMUNITY): Payer: 59

## 2015-05-21 ENCOUNTER — Encounter (HOSPITAL_COMMUNITY): Payer: Self-pay | Admitting: Certified Registered"

## 2015-05-21 DIAGNOSIS — I70213 Atherosclerosis of native arteries of extremities with intermittent claudication, bilateral legs: Secondary | ICD-10-CM | POA: Diagnosis present

## 2015-05-21 DIAGNOSIS — Z6833 Body mass index (BMI) 33.0-33.9, adult: Secondary | ICD-10-CM | POA: Diagnosis not present

## 2015-05-21 DIAGNOSIS — N170 Acute kidney failure with tubular necrosis: Secondary | ICD-10-CM | POA: Diagnosis not present

## 2015-05-21 DIAGNOSIS — E669 Obesity, unspecified: Secondary | ICD-10-CM | POA: Diagnosis present

## 2015-05-21 DIAGNOSIS — M549 Dorsalgia, unspecified: Secondary | ICD-10-CM | POA: Diagnosis present

## 2015-05-21 DIAGNOSIS — Z87891 Personal history of nicotine dependence: Secondary | ICD-10-CM | POA: Diagnosis not present

## 2015-05-21 DIAGNOSIS — I7409 Other arterial embolism and thrombosis of abdominal aorta: Secondary | ICD-10-CM

## 2015-05-21 DIAGNOSIS — R351 Nocturia: Secondary | ICD-10-CM | POA: Diagnosis present

## 2015-05-21 DIAGNOSIS — K429 Umbilical hernia without obstruction or gangrene: Secondary | ICD-10-CM | POA: Diagnosis not present

## 2015-05-21 DIAGNOSIS — R34 Anuria and oliguria: Secondary | ICD-10-CM | POA: Diagnosis not present

## 2015-05-21 DIAGNOSIS — M5126 Other intervertebral disc displacement, lumbar region: Secondary | ICD-10-CM | POA: Diagnosis present

## 2015-05-21 DIAGNOSIS — E875 Hyperkalemia: Secondary | ICD-10-CM | POA: Diagnosis not present

## 2015-05-21 DIAGNOSIS — G8929 Other chronic pain: Secondary | ICD-10-CM | POA: Diagnosis present

## 2015-05-21 DIAGNOSIS — Z79899 Other long term (current) drug therapy: Secondary | ICD-10-CM

## 2015-05-21 DIAGNOSIS — I350 Nonrheumatic aortic (valve) stenosis: Secondary | ICD-10-CM | POA: Diagnosis present

## 2015-05-21 DIAGNOSIS — I959 Hypotension, unspecified: Secondary | ICD-10-CM | POA: Diagnosis not present

## 2015-05-21 DIAGNOSIS — Z4659 Encounter for fitting and adjustment of other gastrointestinal appliance and device: Secondary | ICD-10-CM

## 2015-05-21 DIAGNOSIS — E785 Hyperlipidemia, unspecified: Secondary | ICD-10-CM | POA: Diagnosis present

## 2015-05-21 DIAGNOSIS — I739 Peripheral vascular disease, unspecified: Secondary | ICD-10-CM | POA: Diagnosis present

## 2015-05-21 DIAGNOSIS — D62 Acute posthemorrhagic anemia: Secondary | ICD-10-CM | POA: Diagnosis not present

## 2015-05-21 DIAGNOSIS — K420 Umbilical hernia with obstruction, without gangrene: Secondary | ICD-10-CM | POA: Diagnosis present

## 2015-05-21 DIAGNOSIS — I745 Embolism and thrombosis of iliac artery: Secondary | ICD-10-CM | POA: Diagnosis present

## 2015-05-21 DIAGNOSIS — I70218 Atherosclerosis of native arteries of extremities with intermittent claudication, other extremity: Secondary | ICD-10-CM

## 2015-05-21 DIAGNOSIS — Z7982 Long term (current) use of aspirin: Secondary | ICD-10-CM | POA: Diagnosis not present

## 2015-05-21 DIAGNOSIS — H409 Unspecified glaucoma: Secondary | ICD-10-CM | POA: Diagnosis present

## 2015-05-21 DIAGNOSIS — I70219 Atherosclerosis of native arteries of extremities with intermittent claudication, unspecified extremity: Secondary | ICD-10-CM | POA: Diagnosis present

## 2015-05-21 DIAGNOSIS — Z8249 Family history of ischemic heart disease and other diseases of the circulatory system: Secondary | ICD-10-CM

## 2015-05-21 DIAGNOSIS — I129 Hypertensive chronic kidney disease with stage 1 through stage 4 chronic kidney disease, or unspecified chronic kidney disease: Secondary | ICD-10-CM | POA: Diagnosis present

## 2015-05-21 DIAGNOSIS — E876 Hypokalemia: Secondary | ICD-10-CM | POA: Diagnosis not present

## 2015-05-21 DIAGNOSIS — Z9889 Other specified postprocedural states: Secondary | ICD-10-CM

## 2015-05-21 DIAGNOSIS — N184 Chronic kidney disease, stage 4 (severe): Secondary | ICD-10-CM | POA: Diagnosis not present

## 2015-05-21 DIAGNOSIS — R0602 Shortness of breath: Secondary | ICD-10-CM

## 2015-05-21 DIAGNOSIS — N183 Chronic kidney disease, stage 3 (moderate): Secondary | ICD-10-CM | POA: Diagnosis present

## 2015-05-21 HISTORY — PX: PATCH ANGIOPLASTY: SHX6230

## 2015-05-21 HISTORY — PX: AORTA - BILATERAL FEMORAL ARTERY BYPASS GRAFT: SHX1175

## 2015-05-21 HISTORY — PX: ENDARTERECTOMY FEMORAL: SHX5804

## 2015-05-21 LAB — BASIC METABOLIC PANEL
ANION GAP: 14 (ref 5–15)
BUN: 21 mg/dL — ABNORMAL HIGH (ref 6–20)
CHLORIDE: 104 mmol/L (ref 101–111)
CO2: 20 mmol/L — ABNORMAL LOW (ref 22–32)
Calcium: 9 mg/dL (ref 8.9–10.3)
Creatinine, Ser: 1.93 mg/dL — ABNORMAL HIGH (ref 0.61–1.24)
GFR calc Af Amer: 41 mL/min — ABNORMAL LOW (ref >60)
GFR, EST NON AFRICAN AMERICAN: 35 mL/min — AB (ref >60)
GLUCOSE: 186 mg/dL — AB (ref 65–99)
POTASSIUM: 4.8 mmol/L (ref 3.5–5.1)
SODIUM: 138 mmol/L (ref 135–145)

## 2015-05-21 LAB — CBC
HCT: 32.7 % — ABNORMAL LOW (ref 39.0–52.0)
HEMATOCRIT: 32.2 % — AB (ref 39.0–52.0)
HEMOGLOBIN: 10.3 g/dL — AB (ref 13.0–17.0)
Hemoglobin: 10.6 g/dL — ABNORMAL LOW (ref 13.0–17.0)
MCH: 29.4 pg (ref 26.0–34.0)
MCH: 29.8 pg (ref 26.0–34.0)
MCHC: 32 g/dL (ref 30.0–36.0)
MCHC: 32.4 g/dL (ref 30.0–36.0)
MCV: 92 fL (ref 78.0–100.0)
MCV: 91.9 fL (ref 78.0–100.0)
PLATELETS: 223 10*3/uL (ref 150–400)
Platelets: 258 10*3/uL (ref 150–400)
RBC: 3.5 MIL/uL — ABNORMAL LOW (ref 4.22–5.81)
RBC: 3.56 MIL/uL — ABNORMAL LOW (ref 4.22–5.81)
RDW: 12.8 % (ref 11.5–15.5)
RDW: 12.8 % (ref 11.5–15.5)
WBC: 24.4 10*3/uL — ABNORMAL HIGH (ref 4.0–10.5)
WBC: 21.4 10*3/uL — AB (ref 4.0–10.5)

## 2015-05-21 LAB — BLOOD GAS, ARTERIAL
ACID-BASE DEFICIT: 3.9 mmol/L — AB (ref 0.0–2.0)
Bicarbonate: 20.7 mEq/L (ref 20.0–24.0)
O2 Content: 6 L/min
O2 Saturation: 99 %
PCO2 ART: 38.1 mmHg (ref 35.0–45.0)
PH ART: 7.354 (ref 7.350–7.450)
PO2 ART: 176 mmHg — AB (ref 80.0–100.0)
Patient temperature: 98.6
TCO2: 21.9 mmol/L (ref 0–100)

## 2015-05-21 LAB — PROTIME-INR
INR: 1.18 (ref 0.00–1.49)
PROTHROMBIN TIME: 15.2 s (ref 11.6–15.2)

## 2015-05-21 LAB — PREPARE RBC (CROSSMATCH)

## 2015-05-21 LAB — APTT: aPTT: 26 seconds (ref 24–37)

## 2015-05-21 LAB — MAGNESIUM: MAGNESIUM: 1.5 mg/dL — AB (ref 1.7–2.4)

## 2015-05-21 SURGERY — CREATION, BYPASS, ARTERIAL, AORTA TO FEMORAL, BILATERAL, USING GRAFT
Anesthesia: General | Site: Groin

## 2015-05-21 MED ORDER — KCL IN DEXTROSE-NACL 20-5-0.45 MEQ/L-%-% IV SOLN
INTRAVENOUS | Status: DC
  Administered 2015-05-21 – 2015-05-22 (×3): via INTRAVENOUS
  Filled 2015-05-21 (×7): qty 1000

## 2015-05-21 MED ORDER — HEPARIN SODIUM (PORCINE) 5000 UNIT/ML IJ SOLN
INTRAMUSCULAR | Status: DC | PRN
  Administered 2015-05-21: 10:00:00

## 2015-05-21 MED ORDER — MEPERIDINE HCL 25 MG/ML IJ SOLN: 6.2500 mg | INTRAMUSCULAR | Status: DC | PRN

## 2015-05-21 MED ORDER — FENTANYL CITRATE (PF) 250 MCG/5ML IJ SOLN
INTRAMUSCULAR | Status: AC
  Filled 2015-05-21: qty 5

## 2015-05-21 MED ORDER — ROCURONIUM BROMIDE 50 MG/5ML IV SOLN
INTRAVENOUS | Status: AC
  Filled 2015-05-21: qty 3

## 2015-05-21 MED ORDER — BISACODYL 10 MG RE SUPP: 10.0000 mg | Freq: Every day | RECTAL | Status: DC | PRN

## 2015-05-21 MED ORDER — CETYLPYRIDINIUM CHLORIDE 0.05 % MT LIQD
7.0000 mL | Freq: Two times a day (BID) | OROMUCOSAL | Status: DC
  Administered 2015-05-22 – 2015-05-31 (×14): 7 mL via OROMUCOSAL

## 2015-05-21 MED ORDER — PROMETHAZINE HCL 25 MG/ML IJ SOLN: 6.2500 mg | INTRAMUSCULAR | Status: DC | PRN

## 2015-05-21 MED ORDER — SODIUM CHLORIDE 0.9 % IV SOLN: Freq: Once | INTRAVENOUS | Status: DC

## 2015-05-21 MED ORDER — ALUM & MAG HYDROXIDE-SIMETH 200-200-20 MG/5ML PO SUSP
15.0000 mL | ORAL | Status: DC | PRN
  Filled 2015-05-21: qty 30

## 2015-05-21 MED ORDER — LATANOPROST 0.005 % OP SOLN
1.0000 [drp] | Freq: Every day | OPHTHALMIC | Status: DC
  Administered 2015-05-21 – 2015-05-31 (×11): 1 [drp] via OPHTHALMIC
  Filled 2015-05-21 (×3): qty 2.5

## 2015-05-21 MED ORDER — DEXAMETHASONE SODIUM PHOSPHATE 10 MG/ML IJ SOLN
INTRAMUSCULAR | Status: DC | PRN
  Administered 2015-05-21: 4 mg via INTRAVENOUS

## 2015-05-21 MED ORDER — NITROGLYCERIN IN D5W 200-5 MCG/ML-% IV SOLN
0.0000 ug/min | Freq: Once | INTRAVENOUS | Status: DC
  Filled 2015-05-21: qty 250

## 2015-05-21 MED ORDER — ROCURONIUM BROMIDE 100 MG/10ML IV SOLN
INTRAVENOUS | Status: DC | PRN
  Administered 2015-05-21 (×3): 10 mg via INTRAVENOUS
  Administered 2015-05-21: 20 mg via INTRAVENOUS
  Administered 2015-05-21: 10 mg via INTRAVENOUS
  Administered 2015-05-21: 20 mg via INTRAVENOUS
  Administered 2015-05-21: 30 mg via INTRAVENOUS
  Administered 2015-05-21: 20 mg via INTRAVENOUS

## 2015-05-21 MED ORDER — MIDAZOLAM HCL 5 MG/5ML IJ SOLN
INTRAMUSCULAR | Status: DC | PRN
  Administered 2015-05-21: 2 mg via INTRAVENOUS

## 2015-05-21 MED ORDER — 0.9 % SODIUM CHLORIDE (POUR BTL) OPTIME
TOPICAL | Status: DC | PRN
  Administered 2015-05-21: 3000 mL

## 2015-05-21 MED ORDER — HEPARIN SODIUM (PORCINE) 1000 UNIT/ML IJ SOLN
INTRAMUSCULAR | Status: DC | PRN
  Administered 2015-05-21 (×2): 2000 [IU] via INTRAVENOUS
  Administered 2015-05-21: 10000 [IU] via INTRAVENOUS

## 2015-05-21 MED ORDER — PHENYLEPHRINE HCL 10 MG/ML IJ SOLN
10.0000 mg | INTRAVENOUS | Status: DC | PRN
  Administered 2015-05-21: 10 ug/min via INTRAVENOUS

## 2015-05-21 MED ORDER — DEXTROSE 5 % IV SOLN
1.5000 g | Freq: Two times a day (BID) | INTRAVENOUS | Status: AC
  Administered 2015-05-21 – 2015-05-22 (×2): 1.5 g via INTRAVENOUS
  Filled 2015-05-21 (×2): qty 1.5

## 2015-05-21 MED ORDER — LACTATED RINGERS IV SOLN
INTRAVENOUS | Status: DC | PRN
  Administered 2015-05-21 (×3): via INTRAVENOUS

## 2015-05-21 MED ORDER — HYDROMORPHONE HCL 1 MG/ML IJ SOLN
INTRAMUSCULAR | Status: AC
  Filled 2015-05-21: qty 1

## 2015-05-21 MED ORDER — ONDANSETRON HCL 4 MG/2ML IJ SOLN: 4.0000 mg | Freq: Four times a day (QID) | INTRAMUSCULAR | Status: DC | PRN

## 2015-05-21 MED ORDER — ONDANSETRON HCL 4 MG/2ML IJ SOLN
INTRAMUSCULAR | Status: DC | PRN
  Administered 2015-05-21: 4 mg via INTRAVENOUS

## 2015-05-21 MED ORDER — MANNITOL 25 % IV SOLN
INTRAVENOUS | Status: DC | PRN
  Administered 2015-05-21: 15 g via INTRAVENOUS

## 2015-05-21 MED ORDER — PROTAMINE SULFATE 10 MG/ML IV SOLN
INTRAVENOUS | Status: DC | PRN
  Administered 2015-05-21: 6 mg via INTRAVENOUS

## 2015-05-21 MED ORDER — PROPOFOL 10 MG/ML IV BOLUS
INTRAVENOUS | Status: DC | PRN
  Administered 2015-05-21: 150 mg via INTRAVENOUS

## 2015-05-21 MED ORDER — METOPROLOL TARTRATE 1 MG/ML IV SOLN: 2.0000 mg | INTRAVENOUS | Status: DC | PRN

## 2015-05-21 MED ORDER — GUAIFENESIN-DM 100-10 MG/5ML PO SYRP
15.0000 mL | ORAL_SOLUTION | ORAL | Status: DC | PRN
  Filled 2015-05-21: qty 15

## 2015-05-21 MED ORDER — ACETAMINOPHEN 325 MG PO TABS
325.0000 mg | ORAL_TABLET | ORAL | Status: DC | PRN
  Administered 2015-05-31 – 2015-06-01 (×3): 650 mg via ORAL
  Filled 2015-05-21 (×3): qty 2

## 2015-05-21 MED ORDER — SODIUM CHLORIDE 0.9 % IV SOLN
500.0000 mL | Freq: Once | INTRAVENOUS | Status: AC | PRN
  Administered 2015-05-23: 15:00:00 via INTRAVENOUS

## 2015-05-21 MED ORDER — FENTANYL CITRATE (PF) 250 MCG/5ML IJ SOLN
INTRAMUSCULAR | Status: AC
Start: 2015-05-21 — End: 2015-05-21
  Filled 2015-05-21: qty 5

## 2015-05-21 MED ORDER — SUCCINYLCHOLINE CHLORIDE 20 MG/ML IJ SOLN
INTRAMUSCULAR | Status: DC | PRN
  Administered 2015-05-21: 100 mg via INTRAVENOUS

## 2015-05-21 MED ORDER — ACETAMINOPHEN 650 MG RE SUPP: 325.0000 mg | RECTAL | Status: DC | PRN

## 2015-05-21 MED ORDER — PROPOFOL 10 MG/ML IV BOLUS
INTRAVENOUS | Status: AC
  Filled 2015-05-21: qty 40

## 2015-05-21 MED ORDER — LABETALOL HCL 5 MG/ML IV SOLN: 10.0000 mg | INTRAVENOUS | Status: DC | PRN

## 2015-05-21 MED ORDER — HYDRALAZINE HCL 20 MG/ML IJ SOLN: 5.0000 mg | INTRAMUSCULAR | Status: DC | PRN

## 2015-05-21 MED ORDER — ALBUMIN HUMAN 5 % IV SOLN
INTRAVENOUS | Status: DC | PRN
  Administered 2015-05-21: 13:00:00 via INTRAVENOUS

## 2015-05-21 MED ORDER — DOCUSATE SODIUM 100 MG PO CAPS
100.0000 mg | ORAL_CAPSULE | Freq: Every day | ORAL | Status: DC
  Administered 2015-05-25 – 2015-06-01 (×6): 100 mg via ORAL
  Filled 2015-05-21 (×9): qty 1

## 2015-05-21 MED ORDER — FENTANYL CITRATE (PF) 100 MCG/2ML IJ SOLN
INTRAMUSCULAR | Status: DC | PRN
  Administered 2015-05-21 (×2): 100 ug via INTRAVENOUS
  Administered 2015-05-21 (×4): 50 ug via INTRAVENOUS
  Administered 2015-05-21: 100 ug via INTRAVENOUS
  Administered 2015-05-21 (×4): 50 ug via INTRAVENOUS
  Administered 2015-05-21 (×2): 100 ug via INTRAVENOUS
  Administered 2015-05-21: 50 ug via INTRAVENOUS

## 2015-05-21 MED ORDER — MIDAZOLAM HCL 2 MG/2ML IJ SOLN
INTRAMUSCULAR | Status: AC
  Filled 2015-05-21: qty 2

## 2015-05-21 MED ORDER — LIDOCAINE HCL (CARDIAC) 20 MG/ML IV SOLN
INTRAVENOUS | Status: DC | PRN
  Administered 2015-05-21: 100 mg via INTRAVENOUS

## 2015-05-21 MED ORDER — MORPHINE SULFATE (PF) 2 MG/ML IV SOLN
2.0000 mg | INTRAVENOUS | Status: DC | PRN
  Administered 2015-05-21 (×3): 4 mg via INTRAVENOUS
  Administered 2015-05-22: 2 mg via INTRAVENOUS
  Administered 2015-05-22 (×3): 4 mg via INTRAVENOUS
  Filled 2015-05-21: qty 1
  Filled 2015-05-21 (×3): qty 2
  Filled 2015-05-21: qty 1
  Filled 2015-05-21 (×3): qty 2

## 2015-05-21 MED ORDER — ENOXAPARIN SODIUM 40 MG/0.4ML ~~LOC~~ SOLN
40.0000 mg | SUBCUTANEOUS | Status: DC
  Administered 2015-05-22: 40 mg via SUBCUTANEOUS
  Filled 2015-05-21: qty 0.4

## 2015-05-21 MED ORDER — PHENOL 1.4 % MT LIQD: 1.0000 | OROMUCOSAL | Status: DC | PRN

## 2015-05-21 MED ORDER — MAGNESIUM SULFATE 2 GM/50ML IV SOLN
2.0000 g | Freq: Every day | INTRAVENOUS | Status: DC | PRN
  Filled 2015-05-21: qty 50

## 2015-05-21 MED ORDER — LIDOCAINE HCL (CARDIAC) 20 MG/ML IV SOLN
INTRAVENOUS | Status: AC
  Filled 2015-05-21: qty 10

## 2015-05-21 MED ORDER — HEMOSTATIC AGENTS (NO CHARGE) OPTIME
TOPICAL | Status: DC | PRN
  Administered 2015-05-21 (×2): 1 via TOPICAL

## 2015-05-21 MED ORDER — HYDROMORPHONE HCL 1 MG/ML IJ SOLN
0.2500 mg | INTRAMUSCULAR | Status: DC | PRN
  Administered 2015-05-21 (×4): 0.5 mg via INTRAVENOUS

## 2015-05-21 MED ORDER — DEXTROSE 5 % IV SOLN
1.5000 g | Freq: Two times a day (BID) | INTRAVENOUS | Status: DC
  Filled 2015-05-21: qty 1.5

## 2015-05-21 MED ORDER — SUGAMMADEX SODIUM 200 MG/2ML IV SOLN
INTRAVENOUS | Status: DC | PRN
  Administered 2015-05-21: 200 mg via INTRAVENOUS

## 2015-05-21 MED ORDER — SUCCINYLCHOLINE CHLORIDE 20 MG/ML IJ SOLN
INTRAMUSCULAR | Status: AC
  Filled 2015-05-21: qty 2

## 2015-05-21 MED ORDER — LACTATED RINGERS IV SOLN: INTRAVENOUS | Status: DC

## 2015-05-21 MED ORDER — DEXTROSE 5 % IV SOLN
1.5000 g | Freq: Once | INTRAVENOUS | Status: DC
  Filled 2015-05-21: qty 1.5

## 2015-05-21 MED ORDER — SUGAMMADEX SODIUM 200 MG/2ML IV SOLN
INTRAVENOUS | Status: AC
  Filled 2015-05-21: qty 2

## 2015-05-21 MED ORDER — POTASSIUM CHLORIDE CRYS ER 20 MEQ PO TBCR: 20.0000 meq | EXTENDED_RELEASE_TABLET | Freq: Every day | ORAL | Status: DC | PRN

## 2015-05-21 MED ORDER — CHLORHEXIDINE GLUCONATE 0.12 % MT SOLN
15.0000 mL | Freq: Two times a day (BID) | OROMUCOSAL | Status: DC
  Administered 2015-05-21 – 2015-06-01 (×17): 15 mL via OROMUCOSAL
  Filled 2015-05-21 (×12): qty 15

## 2015-05-21 MED ORDER — PANTOPRAZOLE SODIUM 40 MG IV SOLR
40.0000 mg | Freq: Every day | INTRAVENOUS | Status: DC
  Administered 2015-05-21 – 2015-05-26 (×6): 40 mg via INTRAVENOUS
  Filled 2015-05-21 (×6): qty 40

## 2015-05-21 MED FILL — Heparin Sodium (Porcine) Inj 1000 Unit/ML: INTRAMUSCULAR | Qty: 30 | Status: AC

## 2015-05-21 MED FILL — Sodium Chloride IV Soln 0.9%: INTRAVENOUS | Qty: 1000 | Status: AC

## 2015-05-21 MED FILL — Sodium Chloride Irrigation Soln 0.9%: Qty: 3000 | Status: AC

## 2015-05-21 SURGICAL SUPPLY — 76 items
AGENT HMST SPONGE THK3/8 (HEMOSTASIS) ×6
CANISTER SUCTION 2500CC (MISCELLANEOUS) ×5 IMPLANT
CLIP TI MEDIUM 24 (CLIP) ×5 IMPLANT
CLIP TI WIDE RED SMALL 24 (CLIP) ×5 IMPLANT
COVER PROBE W GEL 5X96 (DRAPES) ×2 IMPLANT
DRSG COVADERM 4X14 (GAUZE/BANDAGES/DRESSINGS) ×2 IMPLANT
DRSG COVADERM 4X6 (GAUZE/BANDAGES/DRESSINGS) ×2 IMPLANT
DRSG COVADERM 4X8 (GAUZE/BANDAGES/DRESSINGS) ×4 IMPLANT
ELECT BLADE 4.0 EZ CLEAN MEGAD (MISCELLANEOUS) ×5
ELECT BLADE 6.5 EXT (BLADE) ×2 IMPLANT
ELECT CAUTERY BLADE 6.4 (BLADE) ×2 IMPLANT
ELECT REM PT RETURN 9FT ADLT (ELECTROSURGICAL) ×5
ELECTRODE BLDE 4.0 EZ CLN MEGD (MISCELLANEOUS) IMPLANT
ELECTRODE REM PT RTRN 9FT ADLT (ELECTROSURGICAL) ×3 IMPLANT
GAUZE SPONGE 4X4 16PLY XRAY LF (GAUZE/BANDAGES/DRESSINGS) ×2 IMPLANT
GEL ULTRASOUND 20GR AQUASONIC (MISCELLANEOUS) ×5 IMPLANT
GLOVE BIO SURGEON STRL SZ7 (GLOVE) ×7 IMPLANT
GLOVE BIOGEL PI IND STRL 6.5 (GLOVE) IMPLANT
GLOVE BIOGEL PI IND STRL 7.0 (GLOVE) IMPLANT
GLOVE BIOGEL PI IND STRL 7.5 (GLOVE) ×3 IMPLANT
GLOVE BIOGEL PI IND STRL 8 (GLOVE) IMPLANT
GLOVE BIOGEL PI INDICATOR 6.5 (GLOVE) ×8
GLOVE BIOGEL PI INDICATOR 7.0 (GLOVE) ×6
GLOVE BIOGEL PI INDICATOR 7.5 (GLOVE) ×10
GLOVE BIOGEL PI INDICATOR 8 (GLOVE) ×2
GLOVE ECLIPSE 6.5 STRL STRAW (GLOVE) ×4 IMPLANT
GLOVE ECLIPSE 7.0 STRL STRAW (GLOVE) ×4 IMPLANT
GLOVE SS BIOGEL STRL SZ 7 (GLOVE) IMPLANT
GLOVE SUPERSENSE BIOGEL SZ 7 (GLOVE) ×4
GLOVE SURG SS PI 7.0 STRL IVOR (GLOVE) ×2 IMPLANT
GOWN STRL REUS W/ TWL LRG LVL3 (GOWN DISPOSABLE) ×9 IMPLANT
GOWN STRL REUS W/TWL LRG LVL3 (GOWN DISPOSABLE) ×42 IMPLANT
GOWN STRL REUS W/TWL XL LVL3 (GOWN DISPOSABLE) ×2 IMPLANT
GRAFT HEMASHIELD 14X8MM (Vascular Products) ×2 IMPLANT
HEMOSTAT SPONGE AVITENE ULTRA (HEMOSTASIS) ×4 IMPLANT
INSERT FOGARTY 61MM (MISCELLANEOUS) ×7 IMPLANT
INSERT FOGARTY SM (MISCELLANEOUS) ×14 IMPLANT
KIT BASIN OR (CUSTOM PROCEDURE TRAY) ×5 IMPLANT
KIT ROOM TURNOVER OR (KITS) ×5 IMPLANT
LOOP VESSEL MAXI BLUE (MISCELLANEOUS) ×2 IMPLANT
NS IRRIG 1000ML POUR BTL (IV SOLUTION) ×14 IMPLANT
PACK AORTA (CUSTOM PROCEDURE TRAY) ×5 IMPLANT
PAD ARMBOARD 7.5X6 YLW CONV (MISCELLANEOUS) ×10 IMPLANT
PATCH VASC XENOSURE 1CMX6CM (Vascular Products) ×5 IMPLANT
PATCH VASC XENOSURE 1X6 (Vascular Products) IMPLANT
PENCIL BUTTON HOLSTER BLD 10FT (ELECTRODE) ×2 IMPLANT
RETAINER VISCERA MED (MISCELLANEOUS) ×5 IMPLANT
SPONGE LAP 18X18 X RAY DECT (DISPOSABLE) ×2 IMPLANT
STAPLER VISISTAT 35W (STAPLE) ×13 IMPLANT
SUT ETHIBOND 5 LR DA (SUTURE) IMPLANT
SUT MNCRL AB 4-0 PS2 18 (SUTURE) ×10 IMPLANT
SUT PDS AB 1 TP1 96 (SUTURE) ×10 IMPLANT
SUT PROLENE 2 0 MH 48 (SUTURE) IMPLANT
SUT PROLENE 3 0 SH 48 (SUTURE) IMPLANT
SUT PROLENE 3 0 SH1 36 (SUTURE) ×10 IMPLANT
SUT PROLENE 5 0 C 1 24 (SUTURE) ×6 IMPLANT
SUT PROLENE 5 0 C 1 36 (SUTURE) IMPLANT
SUT PROLENE 5 0 CC1 (SUTURE) ×4 IMPLANT
SUT PROLENE 6 0 BV (SUTURE) ×4 IMPLANT
SUT SILK 2 0 (SUTURE) ×5
SUT SILK 2 0 SH CR/8 (SUTURE) ×5 IMPLANT
SUT SILK 2 0 TIES 17X18 (SUTURE) ×5
SUT SILK 2-0 18XBRD TIE 12 (SUTURE) ×3 IMPLANT
SUT SILK 2-0 18XBRD TIE BLK (SUTURE) ×3 IMPLANT
SUT SILK 3 0 (SUTURE) ×5
SUT SILK 3 0 TIES 17X18 (SUTURE) ×5
SUT SILK 3 0SH CR/8 30 (SUTURE) ×2 IMPLANT
SUT SILK 3-0 18XBRD TIE 12 (SUTURE) ×3 IMPLANT
SUT SILK 3-0 18XBRD TIE BLK (SUTURE) ×3 IMPLANT
SUT VIC AB 2-0 CT1 27 (SUTURE) ×25
SUT VIC AB 2-0 CT1 TAPERPNT 27 (SUTURE) ×6 IMPLANT
SUT VIC AB 3-0 SH 27 (SUTURE) ×35
SUT VIC AB 3-0 SH 27X BRD (SUTURE) ×6 IMPLANT
TOWEL BLUE STERILE X RAY DET (MISCELLANEOUS) ×10 IMPLANT
TRAY FOLEY W/METER SILVER 16FR (SET/KITS/TRAYS/PACK) ×5 IMPLANT
WATER STERILE IRR 1000ML POUR (IV SOLUTION) ×10 IMPLANT

## 2015-05-21 NOTE — Progress Notes (Signed)
      NG tube in place , doppler signals bilateral DP/PT Incisions clean and dry dressings in place  S/P Aortobifemoral by pass NPO Pending 2S room.

## 2015-05-21 NOTE — Anesthesia Procedure Notes (Addendum)
Procedure Name: Intubation Date/Time: 05/21/2015 8:55 AM Performed by: Charm Barges, DAVID R Pre-anesthesia Checklist: Patient identified, Emergency Drugs available, Suction available, Patient being monitored and Timeout performed Patient Re-evaluated:Patient Re-evaluated prior to inductionOxygen Delivery Method: Circle system utilized Preoxygenation: Pre-oxygenation with 100% oxygen Intubation Type: IV induction Ventilation: Two handed mask ventilation required and Oral airway inserted - appropriate to patient size Laryngoscope Size: Glidescope and 3 Grade View: Grade I Tube type: Oral Tube size: 7.0 mm Number of attempts: 1 Airway Equipment and Method: Video-laryngoscopy Placement Confirmation: ETT inserted through vocal cords under direct vision,  breath sounds checked- equal and bilateral and positive ETCO2 Secured at: 22 cm Tube secured with: Tape Dental Injury: Teeth and Oropharynx as per pre-operative assessment    Central Venous Catheter Insertion Performed by: anesthesiologist 05/21/2015 8:15 AM Patient location: Pre-op. Preanesthetic checklist: patient identified, IV checked, site marked, risks and benefits discussed, surgical consent, monitors and equipment checked, pre-op evaluation, timeout performed and anesthesia consent Position: Trendelenburg Lidocaine 1% used for infiltration Landmarks identified and Seldinger technique used Catheter size: 8 Fr Central line was placed.Double lumen Procedure performed using ultrasound guided technique. Attempts: 1 Following insertion, dressing applied, line sutured and Biopatch. Post procedure assessment: blood return through all ports. Patient tolerated the procedure well with no immediate complications.

## 2015-05-21 NOTE — Op Note (Addendum)
OPERATIVE NOTE   PROCEDURE: 1. Aortobifemoral bypass (Dacron 14 mm x 8 mm) 2. Bilateral iliofemoral endarterectomy 3. Left iliofemoral bovine patch angioplasty 4. Umbilical hernia repair  PRE-OPERATIVE DIAGNOSIS: severe lifestyle limiting intermittent claudication   POST-OPERATIVE DIAGNOSIS: same as above   SURGEON: Leonides Sake, MD  ASSISTANT(S): JD Hart Rochester, MD; Lianne Cure, [facility]   ANESTHESIA: general  ESTIMATED BLOOD LOSS: 600 cc  UOP: 450 cc  FINDING(S): 1.  Umbilical hernia containing small amount of incarcerated omentum 2.  Attenuated fascia supraumbilical 3.  Severely calcific bilateral iliofemoral arteries.  Both were >4 cm deep.  4.  Dopplerable bilateral posterior tibial artery and anterior tibial artery signal  SPECIMEN(S):  none  INDICATIONS:   Robert [name] is a 65 y.o. male who presents with severe lifestyle limiting intermittent claudication.  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  His right external iliac artery was also diseased with a severe stenosis that was unlikely to remain patent even with stenting.  I recommended: aortobifemoral bypass with possible bilateral femoral endarterectomy in this patient.  The patient is aware the risks of aortic surgery include but are not limited to: bleeding, need for transfusion, infection, death, stroke, paralysis, wound complications, bowel injuries, impotence, bowel ischemia, extended ventilation and future ventral hernias.  Overall, I cited a mortality rate of 5-10% and morbidity rate of 30%.  DESCRIPTION: After obtaining full informed written consent, the patient was brought back to the operating room and placed supine upon the operating table.  The patient received IV antibiotics prior to induction.  After obtaining adequate anesthesia, the patient was prepped and draped in the standard fashion for: aortobifemoral bypass.  I turned my attention  first to the right groin.  Under Sonosite guidance, I identified what I thought was the right common femoral artery.  This was difficult due to his obesity.  I made an incision over the right common femoral artery.  Using blunt dissection and electrocautery, I dissected out the right common femoral artery from the inguinal ligament down to the femoral bifurcation.  This artery was found to be severely calcified.  I suspected an endarterectomy was necessary.  I turned my attention to the left groin.  Under Sonosite guidance, I identified what I thought was the left common femoral artery.  This was difficult due to his obesity.  I made an incision over the left common femoral artery.  Using blunt dissection and electrocautery, I dissected out the right common femoral artery from the inguinal ligament down to the femoral bifurcation.  This artery was found to be severely calcified.  I suspected an endarterectomy was necessary on this also.  I packed wet lap pads in both groins.  I turned my attention to the abdomen.  Identifying the mid-line was difficulty due this patient's obesity.  I made an mid-line laparotomy incision.  I dissected through the subcutaneous tissue with electrocautery.  In this process, I entered into this patient's umbilical hernia sac.  There was some incarcerated omentum in this sac, which was viable.  I eventually got down to the mid-line fascia and opened it in the superior segment with electrocautery.  I then bluntly dissected into the peritoneum.  I then opened the rest of the mid-fascia until direct visualization.  In this process, I transected the umbilical hernia sac with electrocautery.    I placed the Balfour retractor to provide lateral retraction.  I then eviscerated the transverse colon  into a wet towel.  I then ran the small bowel bowel from ligament of Treitz until the right colon.  I also ran the rest of the colon.  There was no obvious pathology in the bowel, liver, stomach,  and gallbladder.  I eviscerated the small bowel into a wet towel.  I then set up the Omnitract retractor and applied retractor to provide exposure.  I had to sharply released the proximal segment of the duodenum to gain additional exposure.  With retraction, it appeared a short segment of duodenum wall was stretched with evidence of external surface attenuation without  exposure of any intestine wall or mucosa.  I placed a few 3-0 silk Limbert stitches to reinforce this segment of the intestines.  I opened the retroperitoneum over the aorta with electrocautery.  I dissect out the aorta from the infrarenal aorta down to the aortic bifurcation.  In dissecting out the infrarenal aorta, I found the left renal vein and left renal artery.  I also found an accessory right renal artery.  I dissected out 8 cm of the aorta in the infrarenal position.  I placed an umbilical loop around the aorta.    I then turned my attention to the aortic bifurcation.  Dr. Hart Rochester then dissected from the right groin incision in a superficial position to the right iliac arterial system up to the right aortic bifurcation with a sponge stick.  He passed an umbilical tape in this superficial position.  I clamped the ends of this umbilical tape together.  Dr. Hart Rochester then repeated this process on the left side.    I gave the patient 15 g of Mannitol.  The patient was given 10000 units of Heparin intravenously, which was a therapeutic bolus. An additional 2000 units of Heparin was administered every hour after initial bolus to maintain anticoagulation.  In total, 75643 units of Heparin was administrated to achieve and maintain a therapeutic level of anticoagulation.  After waiting three minutes, I clamped the aorta in the infrarenal position with a Harken aorta clamp.  I then clamped the ~5 cm distally with a Debakey aortic clamp.  I open the the aorta ~3 cm distal to the infrarenal position with an 15-blade.  I then transected the aorta at this  level with a heavy scissor.  I oversewed the distal aorta with a double layer of 3-0 Prolene.  I removed the distal aortic clamp and there was no bleeding.  Based on aortic sizer, I selected a 14 mm x 8 mm Dacron aortobifemoral graft.  The body was shorten sharply to appropriate length.  I sewed the main body of the aortobifemoral graft to the proximal aorta in an end-to-end configuration with a running stitch of 3-0 Prolene.  I placed a Fogarty clamp on the graft proximal to the limb and released the proximal aortic clamp.  There were a few pleats that required repair with interrupted 3-0 Prolene stitches.  At this point, there was no active bleeding from the proximal anastomosis.  Straight Fogarty clamps were applied to each separate aortobifemoral limb and then the Fogarty clamp on the main body removed.  The right aortobifemoral limb was tied to the umbilical tape on this side, deliverying the limb to the right groin.  Similarly the left aortobifemoral limb was delivered to the left groin.  Dr. Hart Rochester clamped the right external iliac artery, superficial femoral artery, and profunda femoral artery.  He made an incision in the common femoral artery and then performed an endarterectomy  from the distal external iliac artery extending into the profunda femoral artery and a limited segment of the proximal superficial femoral artery.  He pulled the right aortobifemoral limb to tension and spatulate the graft to the dimensions of the arteriotomy.  The graft was sewn to the right common femoral artery overlying the profunda femoral artery orifice in an end-to-side configuration with 5-0 Prolene. Prior to completing this anastomosis, he backbled all vessels and allowed the aortobifemoral limb to bleed antegrade.  There was no thrombus in any artery or graft.  The anastomosis was completed in the standard fashion.  The clamp was removed off the right limb.  Meanwhile, I clamped the left external iliac artery and  placed the profunda femoral arteries and superficial femoral artery undertension.  I made an arteriotomy in the left common femoral artery and there continued to be bleeding.  The external iliac artery clamp had to be placed more proximally to gain control.  This artery was severely calcified as evident by the continued bleeding despite clamping.  I extended the arteriotomy into the distal left external iliac artery and extended it distally into the proximal superficial femoral artery.  There was extensive posterior plaque occupying >90% of the lumen of the common femoral artery.  I started the endarterectomy in the mid-segment and then carried it up the external iliac artery clamp under the inguinal ligament.  I then carried this endarterectomy distally into the femoral bifurcation.  I was able to feather out the plaque into  the dominant profunda femoral artery.  The residual profunda femoral artery was weakened and I felt any further manipulation might result in an rupture.  I then carried the endarterectomy into the proximal superficial femoral artery.  There was severe calcific disease compromising the lumen of the superficial femoral artery.  I had to extend this arteriotomy another 5 cm into the superficial femoral artery before I could get to segment where I could extract the plaque relatively safely.  The superficial femoral artery was also quite attenuated after the endarterectomy.  The distal superficial femoral artery externally continued to feel calcified but I could pass a 3 mm dilator and there was acceptable backbleeding.  At this point, I did not feel the aortobifemoral limb could be used as a patch given the geometry of his arteriotomy.  I obtain a bovine pericardial patch and soaked it the manufacturer recommended duration.  I sewed the bovine pericardial patch to the left iliofemoral artery, running 6-0 Prolene stitches from each end, tying them together in the middle.  Prior to completing this  patch angioplasty, I did backbleed all vessels: vigorous profunda femoral artery backbleeding, acceptable but limited superficial femoral artery backbleeding, and limited left external iliac artery backbleeding.  I then reapplied tension to all artery and reclamped the left external iliac artery.  I made an incision in the bovine patch and then extended it over the profunda femoral artery orifice.  I spatulated the left aortobifemoral limb, adjusting the length in the process.  I sewed the limb to the bovine patched common femoral artery with a running stitch of 5-0 Prolene.  Prior to completion, I backbled all vessels and the aortobifemoral limb.  There was no thrombus visualized.  I completed this anastomosis in the usual fashion.  All vessel loops and clamps were removed.  Distally there were dopplerable signals in the anterior tibial artery and posterior tibial artery in both feet.  Both feet were also warm and pink.  I gave the patient  60 mg of Protamine to reverse anticoagulation.  Both groins were packed with Avitene.  After waiting 5 minutes, no further active bleeding was noted in either groin.   Each groin was repaired with a double layer of 2-0 Vicryl, a double layer of 3-0 Vicryl, and then the skin was stapled due to this patient's obesity in each groin.     At this point, the abdominal retraction was reset.  There was small tear in the transverse mesentery that had be clipped.  A bleeding collateral off the distal aorta had to be oversewn with a running stitch of 5-0 Prolene.  I washed out the abdomen.  Initially there appeared to be some diffuse ooze in the pelvic dissection but after the protamine this resolved.  There was some mild ooze from the mesentery but I felt any further manipulation was likely to damage the transverse mesentery.  I ran the bowel from the ligament of Treitz to the sigmoid colon.  No injuries were present other than the previously noted reinforced duodenal segment.  This  segment continued to be intact with no signs of ischemia.  I then released retraction and then reapproximated the retroperitoneum with a 3-0 Vicryl with some difficulty given that the duodenum had been covering a portion of the aorta.   At this point, the small bowel was replaced into its anatomic position.  I repositioned the transverse colon and replaced the omentum.  I verified the nasogastric tube was in correct position.    I returned my attention to the umbilicus.  I dissected residual hernia sac of the umbilicus.  There were defects under the umbilicus that I would repair with the fascial repair.  I repaired the fascia with two running stitches of double stranded PDS 1, running one from each end.  Again the superior fascia was attenuated, so I took larger bites.  These two PDS sutures were pulled taut and tied together.  I sewed the tail of the suture down on the fascia with a 2-0 Vicryl and then transected the extra length.  I reapproximated the subdermal layer of a running stitch of 2-0 Vicryl.  This allowed me to reconstruct the contour of this patient's umbilicus.  I reapproximated the skin with staples.  Sterile bandages were applied to all incision lines.  I then reinterrogated the distal pedal signals, getting the same findings.  I then marked their location on the skin.   COMPLICATIONS: none  CONDITION: stable   Leonides Sake, MD [facility] [place] Office: [phone] Pager: [phone]  05/21/2015, 4:34 PM

## 2015-05-21 NOTE — Transfer of Care (Signed)
Immediate Anesthesia Transfer of Care Note  Patient: Robert [name]  Procedure(s) Performed: Procedure(s): AORTOBIFEMORAL BYPASS GRAFT USING HEMASHIELD GOLD 14X8MM X 40CM VASCULAR GRAFT (N/A) ENDARTERECTOMY BILATERAL FEMORAL ARTERIES (Bilateral) LEFT FEMORAL ARTERY PATCH ANGIOPLASTY USING XENOSURE BIOLOGIC PATCH (Left)  Patient Location: PACU  Anesthesia Type:General  Level of Consciousness: awake, oriented and patient cooperative  Airway & Oxygen Therapy: Patient Spontanous Breathing and Patient connected to face mask oxygen  Post-op Assessment: Report given to RN, Post -op Vital signs reviewed and stable and Patient moving all extremities  Post vital signs: Reviewed and stable  Last Vitals:  Filed Vitals:   05/21/15 0652  BP: 168/55  Pulse: 93  Temp: 37.1 C  Resp: 20    Complications: No apparent anesthesia complications

## 2015-05-21 NOTE — H&P (Signed)
Brief History and Physical  History of Present Illness  Robert [name] is a 65 y.o. male who presents with chief complaint: Bilateral lifestyle limiting claudication.  The patient presents today for ABF.    Past Medical History  Diagnosis Date  . Hyperlipidemia     takes Tricor and Vytorin daily  . Chronic kidney disease     Stage III  Moderate  . Glaucoma     uses eye drops nightly  . Hypertension     takes Diovan HCT daily  . Hypertension 04/10/2015  . Peripheral vascular disease (HCC)   . History of bronchitis as a child   . Chronic back pain     ruptured disc  . Nocturia   . Cataracts, bilateral     immature    Past Surgical History  Procedure Laterality Date  . Peripheral vascular catheterization N/A 03/13/2015    Procedure: Abdominal Aortogram;  Surgeon: Fransisco Hertz, MD;  Location: [facility] INVASIVE CV LAB;  Service: Cardiovascular;  Laterality: N/A;    Social History   Social History  . Marital Status: Married    Spouse Name: N/A  . Number of Children: N/A  . Years of Education: N/A   Occupational History  . Not on file.   Social History Main Topics  . Smoking status: Former Games developer  . Smokeless tobacco: Never Used     Comment: quit smoking 11 yrs ago  . Alcohol Use: Yes     Comment: couple of beers a day  . Drug Use: No  . Sexual Activity: Not on file   Other Topics Concern  . Not on file   Social History Narrative   Epworth Sleepiness Scale = (as of 04/10/2015)    Family History  Problem Relation Age of Onset  . Hyperlipidemia Father   . Peripheral vascular disease Father     amputation  . Cancer Brother     colon/bowel cancer  . Hyperlipidemia Brother   . Cancer Mother   . Liver disease Mother   . Cancer Maternal Grandmother   . Heart disease Paternal Grandmother   . Diabetes Paternal Grandmother     No current facility-administered medications on file prior to encounter.   Current Outpatient Prescriptions on File Prior to  Encounter  Medication Sig Dispense Refill  . aspirin 81 MG tablet Take 81 mg by mouth daily.    . ciclopirox (LOPROX) 0.77 % cream Apply 1 application topically 2 (two) times daily as needed. Gel    . ezetimibe-simvastatin (VYTORIN) 10-40 MG per tablet Take 1 tablet by mouth daily.    . fenofibrate (TRICOR) 145 MG tablet Take 145 mg by mouth daily.    . traMADol (ULTRAM) 50 MG tablet Take 50 mg by mouth 2 (two) times daily as needed for moderate pain. For PAIN   PRN    . Travoprost, BAK Free, (TRAVATAN Z) 0.004 % SOLN ophthalmic solution Place 1 drop into both eyes at bedtime.     . valsartan-hydrochlorothiazide (DIOVAN-HCT) 320-25 MG per tablet Take 1 tablet by mouth daily.    . Ciclopirox 0.77 % gel     . travoprost, benzalkonium, (TRAVATAN) 0.004 % ophthalmic solution Place 1 drop into both eyes at bedtime. Reported on 04/18/2015      No Known Allergies  Review of Systems: As listed above, otherwise negative.  Physical Examination  Filed Vitals:   05/21/15 0652  BP: 168/55  Pulse: 93  Temp: 98.8 F (37.1 C)  TempSrc:  Oral  Resp: 20  SpO2: 97%    General: A&O x 3, WDWN  Pulmonary: Sym exp, good air movt, CTAB, no rales, rhonchi, & wheezing  Cardiac: RRR, Nl S1, S2, no Murmurs, rubs or gallops  Gastrointestinal: soft, NTND, -G/R, - HSM, - masses, - CVAT B  Musculoskeletal: M/S 5/5 throughout , Extremities without ischemic changes   Laboratory: CBC:    Component Value Date/Time   WBC 8.8 05/09/2015 0913   RBC 4.03* 05/09/2015 0913   HGB 12.5* 05/09/2015 0913   HCT 36.8* 05/09/2015 0913   PLT 245 05/09/2015 0913   MCV 91.3 05/09/2015 0913   MCH 31.0 05/09/2015 0913   MCHC 34.0 05/09/2015 0913   RDW 12.8 05/09/2015 0913    BMP:    Component Value Date/Time   NA 135 05/09/2015 0913   K 4.5 05/09/2015 0913   CL 103 05/09/2015 0913   CO2 19* 05/09/2015 0913   GLUCOSE 112* 05/09/2015 0913   BUN 20 05/09/2015 0913   CREATININE 1.51* 05/09/2015 0913   CALCIUM  9.9 05/09/2015 0913   GFRNONAA 47* 05/09/2015 0913   GFRAA 55* 05/09/2015 0913    Coagulation: Lab Results  Component Value Date   INR 1.08 05/09/2015   No results found for: PTT  Lipids: No results found for: CHOL, TRIG, HDL, CHOLHDL, VLDL, LDLCALC, LDLDIRECT    Medical Decision Making  Robert [name] is a 65 y.o. male who presents with: severe lifestyle limiting intermittent claudication .   The patient is scheduled for: ABF The patient is aware the risks of aortic surgery include but are not limited to: bleeding, need for transfusion, infection, death, stroke, paralysis, wound complications, bowel injuries, impotence, bowel ischemia, extended ventilation and future ventral hernias.    Overall, I cited a mortality rate of 5-10% and morbidity rate of 30%.  The patient is aware of the risks and agrees to proceed.  Leonides Sake, MD [facility] [place] Office: [phone] Pager: [phone]  05/21/2015, 7:03 AM

## 2015-05-22 ENCOUNTER — Inpatient Hospital Stay (HOSPITAL_COMMUNITY): Payer: 59

## 2015-05-22 ENCOUNTER — Encounter (HOSPITAL_COMMUNITY): Payer: Self-pay | Admitting: Vascular Surgery

## 2015-05-22 LAB — COMPREHENSIVE METABOLIC PANEL
ALK PHOS: 22 U/L — AB (ref 38–126)
ALT: 16 U/L — AB (ref 17–63)
AST: 32 U/L (ref 15–41)
Albumin: 3 g/dL — ABNORMAL LOW (ref 3.5–5.0)
Anion gap: 11 (ref 5–15)
BILIRUBIN TOTAL: 0.9 mg/dL (ref 0.3–1.2)
BUN: 26 mg/dL — AB (ref 6–20)
CO2: 24 mmol/L (ref 22–32)
CREATININE: 2.2 mg/dL — AB (ref 0.61–1.24)
Calcium: 8.5 mg/dL — ABNORMAL LOW (ref 8.9–10.3)
Chloride: 101 mmol/L (ref 101–111)
GFR calc Af Amer: 35 mL/min — ABNORMAL LOW (ref >60)
GFR, EST NON AFRICAN AMERICAN: 30 mL/min — AB (ref >60)
Glucose, Bld: 179 mg/dL — ABNORMAL HIGH (ref 65–99)
Potassium: 5 mmol/L (ref 3.5–5.1)
Sodium: 136 mmol/L (ref 135–145)
TOTAL PROTEIN: 5.7 g/dL — AB (ref 6.5–8.1)

## 2015-05-22 LAB — BASIC METABOLIC PANEL
Anion gap: 11 (ref 5–15)
BUN: 32 mg/dL — AB (ref 6–20)
CALCIUM: 8.3 mg/dL — AB (ref 8.9–10.3)
CO2: 21 mmol/L — AB (ref 22–32)
CREATININE: 3.02 mg/dL — AB (ref 0.61–1.24)
Chloride: 106 mmol/L (ref 101–111)
GFR calc Af Amer: 24 mL/min — ABNORMAL LOW (ref >60)
GFR, EST NON AFRICAN AMERICAN: 21 mL/min — AB (ref >60)
GLUCOSE: 145 mg/dL — AB (ref 65–99)
Potassium: 5.1 mmol/L (ref 3.5–5.1)
Sodium: 138 mmol/L (ref 135–145)

## 2015-05-22 LAB — CBC
HCT: 28.3 % — ABNORMAL LOW (ref 39.0–52.0)
HEMATOCRIT: 29.7 % — AB (ref 39.0–52.0)
Hemoglobin: 9.5 g/dL — ABNORMAL LOW (ref 13.0–17.0)
Hemoglobin: 9.4 g/dL — ABNORMAL LOW (ref 13.0–17.0)
MCH: 29.7 pg (ref 26.0–34.0)
MCH: 31.2 pg (ref 26.0–34.0)
MCHC: 32 g/dL (ref 30.0–36.0)
MCHC: 33.2 g/dL (ref 30.0–36.0)
MCV: 92.8 fL (ref 78.0–100.0)
MCV: 94 fL (ref 78.0–100.0)
PLATELETS: 215 10*3/uL (ref 150–400)
Platelets: 219 10*3/uL (ref 150–400)
RBC: 3.2 MIL/uL — ABNORMAL LOW (ref 4.22–5.81)
RBC: 3.01 MIL/uL — ABNORMAL LOW (ref 4.22–5.81)
RDW: 13 % (ref 11.5–15.5)
RDW: 13.6 % (ref 11.5–15.5)
WBC: 16.1 10*3/uL — ABNORMAL HIGH (ref 4.0–10.5)
WBC: 21 10*3/uL — ABNORMAL HIGH (ref 4.0–10.5)

## 2015-05-22 LAB — PREPARE RBC (CROSSMATCH)

## 2015-05-22 LAB — LACTIC ACID, PLASMA: LACTIC ACID, VENOUS: 1.4 mmol/L (ref 0.5–2.0)

## 2015-05-22 LAB — AMYLASE: Amylase: 88 U/L (ref 28–100)

## 2015-05-22 LAB — MAGNESIUM: MAGNESIUM: 1.6 mg/dL — AB (ref 1.7–2.4)

## 2015-05-22 MED ORDER — MORPHINE SULFATE 2 MG/ML IV SOLN
INTRAVENOUS | Status: DC
  Administered 2015-05-22: 16.5 mg via INTRAVENOUS
  Administered 2015-05-22 (×2): via INTRAVENOUS
  Administered 2015-05-22: 4.5 mg via INTRAVENOUS
  Administered 2015-05-22: 19.5 mg via INTRAVENOUS
  Administered 2015-05-22: 31.5 mg via INTRAVENOUS
  Filled 2015-05-22 (×3): qty 25

## 2015-05-22 MED ORDER — DIPHENHYDRAMINE HCL 50 MG/ML IJ SOLN: 12.5000 mg | Freq: Four times a day (QID) | INTRAMUSCULAR | Status: DC | PRN

## 2015-05-22 MED ORDER — SODIUM CHLORIDE 0.9 % IV SOLN
Freq: Once | INTRAVENOUS | Status: AC
  Administered 2015-05-22: 23:00:00 via INTRAVENOUS

## 2015-05-22 MED ORDER — DIPHENHYDRAMINE HCL 12.5 MG/5ML PO ELIX: 12.5000 mg | ORAL_SOLUTION | Freq: Four times a day (QID) | ORAL | Status: DC | PRN

## 2015-05-22 MED ORDER — HYDROMORPHONE 1 MG/ML IV SOLN
INTRAVENOUS | Status: DC
  Administered 2015-05-22: 21:00:00 via INTRAVENOUS
  Administered 2015-05-23: 4.2 mg via INTRAVENOUS
  Administered 2015-05-23: 3.6 mg via INTRAVENOUS
  Administered 2015-05-23: 2.6 mg via INTRAVENOUS
  Administered 2015-05-23: 3.6 mg via INTRAVENOUS
  Filled 2015-05-22: qty 25

## 2015-05-22 MED ORDER — SODIUM CHLORIDE 0.9% FLUSH: 9.0000 mL | INTRAVENOUS | Status: DC | PRN

## 2015-05-22 MED ORDER — SODIUM CHLORIDE 0.9 % IV BOLUS (SEPSIS)
500.0000 mL | Freq: Once | INTRAVENOUS | Status: AC
  Administered 2015-05-22: 500 mL via INTRAVENOUS

## 2015-05-22 MED ORDER — DIPHENHYDRAMINE HCL 12.5 MG/5ML PO ELIX
12.5000 mg | ORAL_SOLUTION | Freq: Four times a day (QID) | ORAL | Status: DC | PRN
  Administered 2015-05-24: 12.5 mg via ORAL
  Filled 2015-05-22 (×2): qty 5

## 2015-05-22 MED ORDER — NALOXONE HCL 0.4 MG/ML IJ SOLN: 0.4000 mg | INTRAMUSCULAR | Status: DC | PRN

## 2015-05-22 MED ORDER — SODIUM CHLORIDE 0.9% FLUSH
10.0000 mL | Freq: Two times a day (BID) | INTRAVENOUS | Status: DC
  Administered 2015-05-22 – 2015-05-23 (×2): 10 mL
  Administered 2015-05-24: 20 mL
  Administered 2015-05-24 – 2015-05-28 (×7): 10 mL

## 2015-05-22 MED ORDER — ONDANSETRON HCL 4 MG/2ML IJ SOLN: 4.0000 mg | Freq: Four times a day (QID) | INTRAMUSCULAR | Status: DC | PRN

## 2015-05-22 MED ORDER — IOHEXOL 300 MG/ML  SOLN
25.0000 mL | INTRAMUSCULAR | Status: AC
  Administered 2015-05-22: 25 mL via ORAL

## 2015-05-22 MED ORDER — BISACODYL 10 MG RE SUPP: 10.0000 mg | Freq: Every day | RECTAL | Status: DC | PRN

## 2015-05-22 MED ORDER — MORPHINE SULFATE (PF) 2 MG/ML IV SOLN: 2.0000 mg | Freq: Once | INTRAVENOUS | Status: AC

## 2015-05-22 MED ORDER — SODIUM CHLORIDE 0.9% FLUSH
10.0000 mL | INTRAVENOUS | Status: DC | PRN
  Administered 2015-05-24 – 2015-05-26 (×2): 10 mL
  Filled 2015-05-22 (×2): qty 40

## 2015-05-22 MED ORDER — FLEET ENEMA 7-19 GM/118ML RE ENEM
1.0000 | ENEMA | Freq: Every day | RECTAL | Status: DC | PRN
  Filled 2015-05-22: qty 1

## 2015-05-22 NOTE — Progress Notes (Signed)
OT Cancellation Note  Patient Details Name: Robert [name] MRN: [id number] DOB: 03/02/1951   Cancelled Treatment:    Reason Eval/Treat Not Completed: Pain limiting ability to participate. Dr. Imogene Burn requesting holding activity at this time. Will follow.  Evern Bio 05/22/2015, 4:02 PM

## 2015-05-22 NOTE — Care Management Note (Signed)
Case Management Note  Patient Details  Name: Robert [name] MRN: [id number] Date of Birth: 03-Jan-1951  Subjective/Objective:  s /p ABF, B iliofemoral EA, L femoral BPA, repair of umbilical hernia                 Action/Plan:  Pt is from home with wife.  Pt stated he was independently ambulatory prior to procedure however he had constant pain, pt did not require any DME prior.  CM will continue to monitor for disposition needs   Expected Discharge Date:                  Expected Discharge Plan:  Home/Self Care  In-House Referral:     Discharge planning Services  CM Consult  Post Acute Care Choice:    Choice offered to:     DME Arranged:    DME Agency:     HH Arranged:    HH Agency:     Status of Service:  In process, will continue to follow  Medicare Important Message Given:    Date Medicare IM Given:    Medicare IM give by:    Date Additional Medicare IM Given:    Additional Medicare Important Message give by:     If discussed at Long Length of Stay Meetings, dates discussed:    Additional Comments:  Cherylann Parr, RN 05/22/2015, 10:48 AM

## 2015-05-22 NOTE — Progress Notes (Signed)
UR Completed. Hennessey Cantrell, RN, BSN.  [phone] 

## 2015-05-22 NOTE — Progress Notes (Signed)
6 mg morphine from PCA syringe wasted in sink. Delice Bison, RN (2S) witness. Asher Muir Marquarius Lofton,RN

## 2015-05-22 NOTE — Progress Notes (Signed)
Daily Progress Note  Radiology: Ct Abdomen Pelvis Wo Contrast  05/22/2015  CLINICAL DATA:  IP WHO HAD A RENAL BIOFEM W/ GRAFT YESTERDAY NOW HAVING SEVERE ABD PAIN RADIATING INTO TESTICLESPT IS IN ACUTE RENAL FAILURE SO IV CONTRAST WAS NOT GIVEN? AORTAILIAC OBSTRUCTION EXAM: CT ABDOMEN AND PELVIS WITHOUT CONTRAST TECHNIQUE: Multidetector CT imaging of the abdomen and pelvis was performed following the standard protocol without IV contrast. COMPARISON:  CT, 06/19/2007. FINDINGS: Lung bases: Minimal pleural effusions. Mild dependent subsegmental atelectasis. Heart shows dense coronary artery calcifications. Hepatobiliary: Mild fatty infiltration of the liver. No mass or focal lesion. 2 small dependent gallstones. No acute cholecystitis. No bile duct dilation. Spleen, pancreas and adrenal glands:  Unremarkable. Kidneys, ureters, bladder: No renal masses. Bilateral renal vascular calcifications. No hydronephrosis. Ureters normal course and caliber with no stones. Bladder is decompressed with a Foley catheter. Vascular: An aortobifemoral graft has been placed. It extends from the infrarenal abdominal aorta just below the renal arteries to anastomose with the common femoral arteries. There is some fluid attenuation as well as air within the right inguinal soft tissues extending to the common femoral vessels. There is a smaller amount of air and inflammatory change in the right inguinal region. Fluid attenuation and inflammatory type stranding tracks along the aortoiliac/bifemoral graft, without a focal defined fluid collection to suggest an abscess. There is no evidence of a hematoma within the abdomen or pelvis. These findings are all consistent with the expected day 2 postoperative appearance. Lymph nodes:  No pathologically enlarged lymph nodes. Ascites:  None. Gastrointestinal: No evidence of bowel obstruction, diffuse adynamic ileus or of bowel inflammation. There are scattered colonic diverticula without  diverticulitis. Normal appendix is visualized. Peritoneal cavity: There is a small amount of free intraperitoneal air consistent with postoperative intraperitoneal air. Abdominal wall: Anterior abdominal wall incision. No hernia or fluid collection. IMPRESSION: 1. The patency of the aortoiliac-bifemoral artery graft cannot be assessed on this unenhanced study. 2. Stranding along the graft as well as some fluid attenuation, which is more notable in the inguinal regions, right greater than left, bilateral anterior inguinal soft tissue air, as well as the small amount of intraperitoneal air, is all consistent with the expected postsurgical appearance on postop day 2. No evidence of a postoperative abscess. 3. No evidence of a ureteral stone or obstructive uropathy. 4. Minimal pleural effusions with dependent subsegmental atelectasis. 5. Small gallstones.  No acute cholecystitis. Electronically Signed   By: Amie Portland M.D.   On: 05/22/2015 21:17   Dg Chest Port 1 View  05/22/2015  CLINICAL DATA:  Status post aortobifemoral bypass graft EXAM: PORTABLE CHEST 1 VIEW COMPARISON:  05/21/2015 FINDINGS: Cardiac shadow is again mildly enlarged. A nasogastric catheter is seen within the stomach. A right jugular line is again seen. The lungs are well-aerated without focal infiltrate or sizable effusion. No acute abnormality is noted. IMPRESSION: No acute abnormality seen. Electronically Signed   By: Alcide Clever M.D.   On: 05/22/2015 07:50   Dg Chest Port 1 View  05/21/2015  CLINICAL DATA:  Central line placement.  NG tube placement. EXAM: PORTABLE CHEST 1 VIEW COMPARISON:  None. FINDINGS: Lungs are clear.  No pleural effusion or pneumothorax. Cardiomegaly. Right IJ venous catheter terminates in the mid SVC. Enteric tube terminates in the proximal gastric body. IMPRESSION: Right IJ venous catheter terminates in the mid SVC. Enteric tube terminates in the proximal gastric body. Electronically Signed   By: Charline Bills  M.D.   On: 05/21/2015  17:36   Dg Abd Portable 1v  05/21/2015  CLINICAL DATA:  NG tube placement, status post AAA repair EXAM: PORTABLE ABDOMEN - 1 VIEW COMPARISON:  None. FINDINGS: Nonobstructive bowel gas pattern. Midline skin staples. Enteric tube terminates in the proximal gastric body. IMPRESSION: Enteric tube terminates in the proximal gastric body. Electronically Signed   By: Charline Bills M.D.   On: 05/21/2015 17:38    - I reviewed the CT abd/pelvis.  The patient has progression of contrast into the small bowel consistent with GI motility.  There is a significant amount of gas and stool but no big hematoma.  There is no evidence of hydronephrosis.  All findings on the CT are consistent expected post-operative findings. - Still unclear why the patient is oliguric - Check CVP as surrogate for volume status.  If low, transfuse pRBC - Check bladder pressure to see if possibly abdominal compartment syndrome   Leonides Sake, MD [facility] Office: [phone] Pager: [phone]  05/22/2015, 9:28 PM

## 2015-05-22 NOTE — Progress Notes (Signed)
PT Cancellation Note  Patient Details Name: HILTON SAEPHAN MRN: [id number] DOB: 05-Apr-1951   Cancelled Treatment:    Reason Eval/Treat Not Completed: Medical issues which prohibited therapy;Pain limiting ability to participate.  Will see as able 2/10. 05/22/2015  [place] Bing, PT [phone]  (pager)   Cisco Kindt, Eliseo Gum 05/22/2015, 4:17 PM

## 2015-05-22 NOTE — Progress Notes (Signed)
Dr. Imogene Burn made aware of CVP of 15 and IAP of 14. Orders received for 2 units of packed red blood cells followed by  of IV lasix. Will continue to monitor closely. Modena Jansky RN [address] [place]

## 2015-05-22 NOTE — Progress Notes (Addendum)
   Daily Progress Note  UOP dropped off earlier and pt bolused 500 cc of NS.  UOP continues to be low, 10 cc/hr, with increasing abodminal pain.  On exam, +voluntary guarding without rebound tenderness.  Exam non-diagnostic.  Will recheck CBC and BMP.  May need to obtain CT abd/pelvis vs sigmoidoscopy if concerns of bowel ischemia arise.   Leonides Sake, MD [facility] [place] Office: [phone] Pager: [phone]  05/22/2015, 4:02 PM   Addendum  Laboratory: CBC:    Component Value Date/Time   WBC 21.0* 05/22/2015 1608   RBC 3.01* 05/22/2015 1608   HGB 9.4* 05/22/2015 1608   HCT 28.3* 05/22/2015 1608   PLT 215 05/22/2015 1608   MCV 94.0 05/22/2015 1608   MCH 31.2 05/22/2015 1608   MCHC 33.2 05/22/2015 1608   RDW 13.6 05/22/2015 1608    BMP:    Component Value Date/Time   NA 138 05/22/2015 1608   K 5.1 05/22/2015 1608   CL 106 05/22/2015 1608   CO2 21* 05/22/2015 1608   GLUCOSE 145* 05/22/2015 1608   BUN 32* 05/22/2015 1608   CREATININE 3.02* 05/22/2015 1608   CALCIUM 8.3* 05/22/2015 1608   GFRNONAA 21* 05/22/2015 1608   GFRAA 24* 05/22/2015 1608   Lactic acid: 1.4  - progressive deterioration in renal function (Cr 3.02) - H/H stable - will get stat non-contrast Abd/pelvis CT (can't get CTA or PO contrast at this point) - May need to get nephrology involved if oliguria progresses  Leonides Sake, MD [facility] [place] Office: [phone] Pager: [phone]  05/22/2015, 5:43 PM

## 2015-05-22 NOTE — Progress Notes (Addendum)
   Daily Progress Note  Assessment/Planning: POD #1 s/p ABF, B iliofemoral EA, L femoral BPA, repair of umbilical hernia   NEURO: switch to PCA for pain control  PULM: IS  CV: stable hemodynamics  GI: keep NGT, will need to keep eye on abd wall given the attenuated fascia in this patient found intraop  FEN: continue NS for another day and then change to MIVF tomorrow, continue hydration  REN: UOP continues to be >30 cc/hr and clear, so the bump in Cr likely related to intraop hypotension.  As there was no thrombus at the aortic neck, I doubt crossclamp resulted in embolization.  HEME: appropriate post-op H/H reflecting intraoperative hemorrhage  ID: post-prophylaxis, will need to keep close eye on groins given this patient's obesity   Subjective  - 1 Day Post-Op  C/o pain in incision  Objective Filed Vitals:   05/22/15 0400 05/22/15 0500 05/22/15 0600 05/22/15 0700  BP: 124/47 124/49 132/61 114/61  Pulse: 95 95 95 101  Temp:      TempSrc:      Resp: Height:      Weight:      SpO2: 99% 100% 100% 94%    Intake/Output Summary (Last 24 hours) at 05/22/15 0737 Last data filed at 05/22/15 0600  Gross per 24 hour  Intake 5078.75 ml  Output   1870 ml  Net 3208.75 ml    PULM  BLL rales CV  RRR GI  soft, appropriately tender, bandages on VASC  B groin bandaged, palpable faint AT.  R PT>AT, L AT>PT  Laboratory CBC    Component Value Date/Time   WBC 16.1* 05/22/2015 0500   HGB 9.5* 05/22/2015 0500   HCT 29.7* 05/22/2015 0500   PLT 219 05/22/2015 0500    BMET    Component Value Date/Time   NA 136 05/22/2015 0500   K 5.0 05/22/2015 0500   CL 101 05/22/2015 0500   CO2 24 05/22/2015 0500   GLUCOSE 179* 05/22/2015 0500   BUN 26* 05/22/2015 0500   CREATININE 2.20* 05/22/2015 0500   CALCIUM 8.5* 05/22/2015 0500   GFRNONAA 30* 05/22/2015 0500   GFRAA 35* 05/22/2015 0500    Leonides Sake, MD [facility] [place] Office:  [phone] Pager: [phone]  05/22/2015, 7:37 AM

## 2015-05-22 NOTE — Progress Notes (Signed)
Patient's urine output continues to be approximately 10 cc/hr. Pt's pain has also increased to a 9/10 (was previously steady at a 6/10). Dr. Imogene Burn paged. New orders received.  Will continue to monitor. Asher Muir Leven Hoel,RN

## 2015-05-22 NOTE — Progress Notes (Signed)
Patient noted to have decreased urine output for the last 2 hours. 15 cc total. Bladder scan showed a range of 15-100 but was difficult to assess due to bandages and abdominal pain. Dr. Imogene Burn notified. Order received to give 500 CC NS bolus.  Will continue to monitor. Asher Muir Marrissa Dai,RN

## 2015-05-23 ENCOUNTER — Inpatient Hospital Stay (HOSPITAL_COMMUNITY): Payer: 59 | Admitting: Anesthesiology

## 2015-05-23 ENCOUNTER — Inpatient Hospital Stay (HOSPITAL_COMMUNITY): Payer: 59

## 2015-05-23 ENCOUNTER — Encounter (HOSPITAL_COMMUNITY)
Admission: RE | Disposition: A | Discharge status: Home Health | Payer: Self-pay | Source: Ambulatory Visit | Attending: Vascular Surgery

## 2015-05-23 DIAGNOSIS — R34 Anuria and oliguria: Secondary | ICD-10-CM

## 2015-05-23 DIAGNOSIS — N184 Chronic kidney disease, stage 4 (severe): Secondary | ICD-10-CM

## 2015-05-23 HISTORY — PX: INSERTION OF DIALYSIS CATHETER: SHX1324

## 2015-05-23 LAB — CBC
HEMATOCRIT: 32.7 % — AB (ref 39.0–52.0)
HEMOGLOBIN: 10.4 g/dL — AB (ref 13.0–17.0)
MCH: 29.7 pg (ref 26.0–34.0)
MCHC: 31.8 g/dL (ref 30.0–36.0)
MCV: 93.4 fL (ref 78.0–100.0)
Platelets: 217 10*3/uL (ref 150–400)
RBC: 3.5 MIL/uL — AB (ref 4.22–5.81)
RDW: 14.8 % (ref 11.5–15.5)
WBC: 27.7 10*3/uL — AB (ref 4.0–10.5)

## 2015-05-23 LAB — BASIC METABOLIC PANEL
ANION GAP: 9 (ref 5–15)
Anion gap: 6 (ref 5–15)
BUN: 37 mg/dL — AB (ref 6–20)
BUN: 42 mg/dL — ABNORMAL HIGH (ref 6–20)
CHLORIDE: 106 mmol/L (ref 101–111)
CHLORIDE: 106 mmol/L (ref 101–111)
CO2: 22 mmol/L (ref 22–32)
CO2: 22 mmol/L (ref 22–32)
CREATININE: 4.75 mg/dL — AB (ref 0.61–1.24)
CREATININE: 5.75 mg/dL — AB (ref 0.61–1.24)
Calcium: 7.9 mg/dL — ABNORMAL LOW (ref 8.9–10.3)
Calcium: 8.2 mg/dL — ABNORMAL LOW (ref 8.9–10.3)
GFR calc Af Amer: 14 mL/min — ABNORMAL LOW (ref >60)
GFR calc non Af Amer: 12 mL/min — ABNORMAL LOW (ref >60)
GFR calc non Af Amer: 9 mL/min — ABNORMAL LOW (ref >60)
GFR, EST AFRICAN AMERICAN: 11 mL/min — AB (ref >60)
GLUCOSE: 176 mg/dL — AB (ref 65–99)
Glucose, Bld: 142 mg/dL — ABNORMAL HIGH (ref 65–99)
POTASSIUM: 6.7 mmol/L — AB (ref 3.5–5.1)
Potassium: 6.1 mmol/L (ref 3.5–5.1)
SODIUM: 134 mmol/L — AB (ref 135–145)
SODIUM: 137 mmol/L (ref 135–145)

## 2015-05-23 LAB — MRSA PCR SCREENING: MRSA by PCR: NEGATIVE

## 2015-05-23 LAB — GLUCOSE, CAPILLARY: Glucose-Capillary: 102 mg/dL — ABNORMAL HIGH (ref 65–99)

## 2015-05-23 SURGERY — INSERTION OF DIALYSIS CATHETER
Anesthesia: Monitor Anesthesia Care | Site: Neck

## 2015-05-23 MED ORDER — PROPOFOL 10 MG/ML IV BOLUS
INTRAVENOUS | Status: AC
  Filled 2015-05-23: qty 20

## 2015-05-23 MED ORDER — LIDOCAINE-EPINEPHRINE (PF) 1 %-1:200000 IJ SOLN
INTRAMUSCULAR | Status: DC | PRN
  Administered 2015-05-23: 30 mL

## 2015-05-23 MED ORDER — HYDROMORPHONE HCL 1 MG/ML IJ SOLN
0.5000 mg | Freq: Once | INTRAMUSCULAR | Status: AC
Start: 2015-05-23 — End: 2015-05-23
  Administered 2015-05-23: 0.5 mg via INTRAVENOUS
  Filled 2015-05-23: qty 1

## 2015-05-23 MED ORDER — DIPHENHYDRAMINE HCL 50 MG/ML IJ SOLN
INTRAMUSCULAR | Status: AC
  Filled 2015-05-23: qty 1

## 2015-05-23 MED ORDER — SODIUM POLYSTYRENE SULFONATE 15 GM/60ML PO SUSP: 30.0000 g | ORAL | Status: DC

## 2015-05-23 MED ORDER — LIDOCAINE-EPINEPHRINE (PF) 1 %-1:200000 IJ SOLN
INTRAMUSCULAR | Status: AC
  Filled 2015-05-23: qty 30

## 2015-05-23 MED ORDER — DIPHENHYDRAMINE HCL 50 MG/ML IJ SOLN
INTRAMUSCULAR | Status: DC | PRN
  Administered 2015-05-23: 25 mg via INTRAVENOUS

## 2015-05-23 MED ORDER — 0.9 % SODIUM CHLORIDE (POUR BTL) OPTIME
TOPICAL | Status: DC | PRN
  Administered 2015-05-23: 1000 mL

## 2015-05-23 MED ORDER — CEFAZOLIN SODIUM-DEXTROSE 2-3 GM-% IV SOLR
INTRAVENOUS | Status: AC
  Filled 2015-05-23: qty 50

## 2015-05-23 MED ORDER — FUROSEMIDE 10 MG/ML IJ SOLN
160.0000 mg | Freq: Four times a day (QID) | INTRAVENOUS | Status: DC
  Administered 2015-05-23 – 2015-05-27 (×14): 160 mg via INTRAVENOUS
  Filled 2015-05-23 (×18): qty 16

## 2015-05-23 MED ORDER — ENOXAPARIN SODIUM 30 MG/0.3ML ~~LOC~~ SOLN
30.0000 mg | SUBCUTANEOUS | Status: DC
  Administered 2015-05-23 – 2015-06-01 (×10): 30 mg via SUBCUTANEOUS
  Filled 2015-05-23 (×11): qty 0.3

## 2015-05-23 MED ORDER — CALCIUM GLUCONATE 10 % IV SOLN: 1.0000 g | Freq: Once | INTRAVENOUS | Status: DC

## 2015-05-23 MED ORDER — INSULIN ASPART 100 UNIT/ML ~~LOC~~ SOLN
10.0000 [IU] | Freq: Once | SUBCUTANEOUS | Status: AC
  Administered 2015-05-23: 10 [IU] via INTRAVENOUS

## 2015-05-23 MED ORDER — DEXTROSE 50 % IV SOLN
25.0000 mL | Freq: Once | INTRAVENOUS | Status: AC
  Administered 2015-05-23: 25 mL via INTRAVENOUS
  Filled 2015-05-23: qty 50

## 2015-05-23 MED ORDER — ONDANSETRON HCL 4 MG/2ML IJ SOLN
INTRAMUSCULAR | Status: DC | PRN
  Administered 2015-05-23: 4 mg via INTRAVENOUS

## 2015-05-23 MED ORDER — HYDROMORPHONE HCL 1 MG/ML IJ SOLN
0.5000 mg | INTRAMUSCULAR | Status: DC | PRN
  Administered 2015-05-23 – 2015-05-24 (×2): 0.5 mg via INTRAVENOUS
  Filled 2015-05-23 (×2): qty 1

## 2015-05-23 MED ORDER — SODIUM CHLORIDE 0.9 % IV SOLN
1.0000 g | Freq: Once | INTRAVENOUS | Status: AC
  Administered 2015-05-23: 1 g via INTRAVENOUS
  Filled 2015-05-23: qty 10

## 2015-05-23 MED ORDER — DEXTROSE 50 % IV SOLN
INTRAVENOUS | Status: DC | PRN
  Administered 2015-05-23: 1 via INTRAVENOUS

## 2015-05-23 MED ORDER — SODIUM CHLORIDE 0.9 % IV SOLN: INTRAVENOUS | Status: DC

## 2015-05-23 MED ORDER — FUROSEMIDE 10 MG/ML IJ SOLN
20.0000 mg | Freq: Once | INTRAMUSCULAR | Status: AC
  Administered 2015-05-23: 20 mg via INTRAVENOUS
  Filled 2015-05-23: qty 2

## 2015-05-23 MED ORDER — FENTANYL CITRATE (PF) 100 MCG/2ML IJ SOLN
INTRAMUSCULAR | Status: DC | PRN
  Administered 2015-05-23 (×2): 50 ug via INTRAVENOUS

## 2015-05-23 MED ORDER — INSULIN ASPART 100 UNIT/ML ~~LOC~~ SOLN
SUBCUTANEOUS | Status: DC | PRN
  Administered 2015-05-23: 10 [IU] via SUBCUTANEOUS

## 2015-05-23 MED ORDER — HEPARIN SODIUM (PORCINE) 1000 UNIT/ML IJ SOLN
INTRAMUSCULAR | Status: AC
  Filled 2015-05-23: qty 1

## 2015-05-23 MED ORDER — FENTANYL CITRATE (PF) 250 MCG/5ML IJ SOLN
INTRAMUSCULAR | Status: AC
  Filled 2015-05-23: qty 5

## 2015-05-23 MED ORDER — PROPOFOL 10 MG/ML IV BOLUS
INTRAVENOUS | Status: DC | PRN
  Administered 2015-05-23 (×2): 10 mg via INTRAVENOUS

## 2015-05-23 MED ORDER — CEFAZOLIN SODIUM-DEXTROSE 2-3 GM-% IV SOLR
2.0000 g | Freq: Once | INTRAVENOUS | Status: AC
  Administered 2015-05-23: 2 g via INTRAVENOUS
  Filled 2015-05-23: qty 50

## 2015-05-23 MED ORDER — LIDOCAINE HCL (CARDIAC) 20 MG/ML IV SOLN
INTRAVENOUS | Status: DC | PRN
  Administered 2015-05-23: 50 mg via INTRAVENOUS

## 2015-05-23 MED ORDER — HEPARIN SODIUM (PORCINE) 1000 UNIT/ML IJ SOLN
INTRAMUSCULAR | Status: DC | PRN
  Administered 2015-05-23: 1000 [IU]

## 2015-05-23 MED ORDER — SODIUM CHLORIDE 0.9 % IV SOLN
INTRAVENOUS | Status: DC | PRN
  Administered 2015-05-23: 500 mL

## 2015-05-23 SURGICAL SUPPLY — 44 items
BAG DECANTER FOR FLEXI CONT (MISCELLANEOUS) ×3 IMPLANT
BIOPATCH RED 1 DISK 7.0 (GAUZE/BANDAGES/DRESSINGS) ×2 IMPLANT
BIOPATCH RED 1IN DISK 7.0MM (GAUZE/BANDAGES/DRESSINGS) ×1
BIOPATCH WHT 1IN DISK W/4.0 H (GAUZE/BANDAGES/DRESSINGS) ×2 IMPLANT
CATH PALINDROME RT-P 15FX19CM (CATHETERS) IMPLANT
CATH PALINDROME RT-P 15FX23CM (CATHETERS) IMPLANT
CATH PALINDROME RT-P 15FX28CM (CATHETERS) ×2 IMPLANT
CATH PALINDROME RT-P 15FX55CM (CATHETERS) IMPLANT
CHLORAPREP W/TINT 26ML (MISCELLANEOUS) ×3 IMPLANT
COVER PROBE W GEL 5X96 (DRAPES) IMPLANT
DRAPE C-ARM 42X72 X-RAY (DRAPES) ×3 IMPLANT
DRAPE CHEST BREAST 15X10 FENES (DRAPES) ×3 IMPLANT
GAUZE SPONGE 2X2 8PLY STRL LF (GAUZE/BANDAGES/DRESSINGS) ×1 IMPLANT
GAUZE SPONGE 4X4 16PLY XRAY LF (GAUZE/BANDAGES/DRESSINGS) ×3 IMPLANT
GLOVE BIO SURGEON STRL SZ 6.5 (GLOVE) ×2 IMPLANT
GLOVE BIO SURGEON STRL SZ7.5 (GLOVE) ×3 IMPLANT
GLOVE BIO SURGEONS STRL SZ 6.5 (GLOVE) ×2
GLOVE BIOGEL PI IND STRL 6.5 (GLOVE) IMPLANT
GLOVE BIOGEL PI IND STRL 8 (GLOVE) ×1 IMPLANT
GLOVE BIOGEL PI INDICATOR 6.5 (GLOVE) ×4
GLOVE BIOGEL PI INDICATOR 8 (GLOVE) ×2
GOWN STRL REUS W/ TWL LRG LVL3 (GOWN DISPOSABLE) ×2 IMPLANT
GOWN STRL REUS W/TWL LRG LVL3 (GOWN DISPOSABLE) ×9
KIT BASIN OR (CUSTOM PROCEDURE TRAY) ×3 IMPLANT
KIT ROOM TURNOVER OR (KITS) ×3 IMPLANT
LIQUID BAND (GAUZE/BANDAGES/DRESSINGS) ×2 IMPLANT
NDL 18GX1X1/2 (RX/OR ONLY) (NEEDLE) ×1 IMPLANT
NDL HYPO 25GX1X1/2 BEV (NEEDLE) ×1 IMPLANT
NEEDLE 18GX1X1/2 (RX/OR ONLY) (NEEDLE) ×3 IMPLANT
NEEDLE 22X1 1/2 (OR ONLY) (NEEDLE) ×3 IMPLANT
NEEDLE HYPO 25GX1X1/2 BEV (NEEDLE) ×3 IMPLANT
NS IRRIG 1000ML POUR BTL (IV SOLUTION) ×3 IMPLANT
PACK SURGICAL SETUP 50X90 (CUSTOM PROCEDURE TRAY) ×3 IMPLANT
PAD ARMBOARD 7.5X6 YLW CONV (MISCELLANEOUS) ×6 IMPLANT
SPONGE GAUZE 2X2 STER 10/PKG (GAUZE/BANDAGES/DRESSINGS) ×2
SUT ETHILON 3 0 PS 1 (SUTURE) ×3 IMPLANT
SUT VICRYL 4-0 PS2 18IN ABS (SUTURE) ×3 IMPLANT
SYR 20CC LL (SYRINGE) ×6 IMPLANT
SYR 5ML LL (SYRINGE) ×6 IMPLANT
SYR CONTROL 10ML LL (SYRINGE) ×3 IMPLANT
SYRINGE 10CC LL (SYRINGE) ×3 IMPLANT
TAPE CLOTH SURG 4X10 WHT LF (GAUZE/BANDAGES/DRESSINGS) ×2 IMPLANT
TOWEL OR 17X24 6PK STRL BLUE (TOWEL DISPOSABLE) ×2 IMPLANT
WATER STERILE IRR 1000ML POUR (IV SOLUTION) ×3 IMPLANT

## 2015-05-23 NOTE — Progress Notes (Signed)
9 mg Dilaudid from PCA wasted in sink. Orene Desanctis, RN witness. Asher Muir Michelene Keniston,RN

## 2015-05-23 NOTE — Anesthesia Preprocedure Evaluation (Addendum)
Anesthesia Evaluation  Patient identified by MRN, date of birth, ID band Patient awake    Reviewed: Allergy & Precautions, NPO status   Airway Mallampati: II  TM Distance: >3 FB Neck ROM: Full    Dental   Pulmonary former smoker,    breath sounds clear to auscultation       Cardiovascular hypertension, + Peripheral Vascular Disease   Rhythm:Regular Rate:Normal     Neuro/Psych    GI/Hepatic Neg liver ROS,   Endo/Other    Renal/GU Renal diseaseHistory noted. CE     Musculoskeletal   Abdominal   Peds  Hematology   Anesthesia Other Findings   Reproductive/Obstetrics                           Anesthesia Physical Anesthesia Plan  ASA: III  Anesthesia Plan: MAC   Post-op Pain Management:    Induction: Intravenous  Airway Management Planned: Simple Face Mask  Additional Equipment:   Intra-op Plan:   Post-operative Plan:   Informed Consent: I have reviewed the patients History and Physical, chart, labs and discussed the procedure including the risks, benefits and alternatives for the proposed anesthesia with the patient or authorized representative who has indicated his/her understanding and acceptance.   Dental advisory given  Plan Discussed with: CRNA and Anesthesiologist  Anesthesia Plan Comments:         Anesthesia Quick Evaluation

## 2015-05-23 NOTE — Transfer of Care (Signed)
Immediate Anesthesia Transfer of Care Note  Patient: Robert [name]  Procedure(s) Performed: Procedure(s): INSERTION OF DIALYSIS CATHETER left internal jugular (N/A)  Patient Location: PACU  Anesthesia Type:MAC  Level of Consciousness: awake, alert  and oriented  Airway & Oxygen Therapy: Patient Spontanous Breathing and Patient connected to nasal cannula oxygen  Post-op Assessment: Report given to RN and Post -op Vital signs reviewed and stable  Post vital signs: Reviewed and stable  Last Vitals:  Filed Vitals:   05/23/15 1100 05/23/15 1200  BP: 118/59   Pulse: 102   Temp:    Resp: 10 15    Complications: No apparent anesthesia complications

## 2015-05-23 NOTE — Progress Notes (Signed)
Pt came to unit without PCA pump. Upon assessment, pt sleepy and complaining of no pain. PCA pump left disconnected as patient is awaiting HD. Family informed and agreed. Will continue to monitor for signs of pain.

## 2015-05-23 NOTE — Progress Notes (Signed)
CRITICAL VALUE ALERT  Critical value received:  K 6.1 Creat 4.75  Date of notification:  05/23/2015  Time of notification:  0550  Critical value read back:Yes.    Nurse who received alert:  Frances Furbish  MD notified (1st page):  Imogene Burn   Time of first page:  [phone]  MD notified (2nd page):  Time of second page:  Responding MD:  Imogene Burn  Time MD responded:  [phone]

## 2015-05-23 NOTE — Anesthesia Procedure Notes (Signed)
Procedure Name: MAC Date/Time: 05/23/2015 2:56 PM Performed by: Marena Chancy Pre-anesthesia Checklist: Patient identified, Emergency Drugs available, Suction available, Patient being monitored and Timeout performed Patient Re-evaluated:Patient Re-evaluated prior to inductionOxygen Delivery Method: Nasal cannula

## 2015-05-23 NOTE — Anesthesia Postprocedure Evaluation (Signed)
Anesthesia Post Note  Patient: Robert [name]  Procedure(s) Performed: Procedure(s) (LRB): INSERTION OF DIALYSIS CATHETER left internal jugular (N/A)  Patient location during evaluation: PACU Anesthesia Type: MAC Level of consciousness: awake Pain management: pain level controlled Respiratory status: spontaneous breathing Cardiovascular status: stable    Last Vitals:  Filed Vitals:   05/23/15 1754 05/23/15 1800  BP:  129/45  Pulse:    Temp: 37 C   Resp:  12    Last Pain:  Filed Vitals:   05/23/15 1817  PainSc: 0-No pain                 EDWARDS,Dulcinea Kinser

## 2015-05-23 NOTE — Progress Notes (Signed)
9ml Morphine waster in sink while switching out PCA. Witnessed by Ron Agee RN.  Modena Jansky RN [address] [place]

## 2015-05-23 NOTE — Interval H&P Note (Signed)
History and Physical Interval Note:  05/23/2015 1:49 PM  Robert [name]  has presented today for surgery, with the diagnosis of Acute renal insufficiency N28.9  The various methods of treatment have been discussed with the patient and family. After consideration of risks, benefits and other options for treatment, the patient has consented to  Procedure(s): INSERTION OF DIALYSIS CATHETER (N/A) as a surgical intervention .  The patient's history has been reviewed, patient examined, no change in status, stable for surgery.  I have reviewed the patient's chart and labs.  Questions were answered to the patient's satisfaction.     Waverly Ferrari

## 2015-05-23 NOTE — Progress Notes (Signed)
CRITICAL VALUE ALERT  Critical value received:  Potassium 6.7  Date of notification:  05/23/15  Time of notification:  1320  Critical value read back:Yes.    Nurse who received alert:  Alwyn Ren, RN   MD notified (1st page):  Dr. Edilia Bo at bedside in Short Stay when phone call from lab received.   Time of first page:  1320  MD notified (2nd page):  Time of second page:  Responding MD:  Dr. Edilia Bo  Time MD responded:  1320

## 2015-05-23 NOTE — Progress Notes (Signed)
   Daily Progress Note  Appreciate Nephrology's help with Mr. Auth.  Dr. Dickson will placed tunneled dialysis catheter today.  I updated the patient's wife on his status over the phone.  The risks of tunneled dialysis catheter placement include but are not limited to: bleeding, infection, central venous injury, pneumothorax, possible venous stenosis, possible malpositioning in the venous system, and possible infections related to long-term catheter presence.  The patient's wife was made aware of these risks and agreed to proceed on behalf of the patient.   Brian Chen, MD [facility] Office: [phone] Pager: [phone]  05/23/2015, 12:59 PM      

## 2015-05-23 NOTE — Progress Notes (Signed)
OT Cancellation Note  Patient Details Name: Robert [name] MRN: [id number] DOB: 06-29-1950   Cancelled Treatment:    Reason Eval/Treat Not Completed: Medical issues which prohibited therapy. Pt with acute renal failure, awaiting nephrology consult. Pt also fatigued. Will continue to follow.  Evern Bio 05/23/2015, 9:28 AM

## 2015-05-23 NOTE — Progress Notes (Signed)
PT Cancellation Note  Patient Details Name: Robert [name] MRN: [id number] DOB: [birth year]   Cancelled Treatment:    Reason Eval/Treat Not Completed: Patient not medically ready;Medical issues which prohibited therapy Pt with ARF, delirious and not appropriate for PT at this time. Awaiting nephrology consult. Will follow up.   Blake Divine A Aariel Ems 05/23/2015, 9:32 AM Mylo Red, PT, DPT [phone]

## 2015-05-23 NOTE — Op Note (Signed)
05/23/2015  PREOP DIAGNOSIS: Chronic kidney disease  POSTOP DIAGNOSIS: Chronic kidney disease  PROCEDURE: Ultrasound guided placement of Left IJ tunneled dialysis catheter  (28 cm)  SURGEON: Di Kindle. Edilia Bo, MD, FACS  ASSIST: none  ANESTHESIA: local with sedation   EBL: minimal  FINDINGS: patent left IJ  INDICATIONS: Acute Renal Failure  TECHNIQUE: The patient was taken to the operating room and sedated by anesthesia. The neck and upper chest were prepped and draped in the usual sterile fashion. After the skin was anesthetized with 1% lidocaine, and under ultrasound guidance, the left IJ was cannulated and a guidewire introduced into the superior vena cava under fluoroscopic control. The tract over the wire was dilated and then the dilator and peel-away sheath were passed over the wire and the wire and dilator removed. The catheter was passed through the peel-away sheath and positioned in the right atrium. The exit site for the catheter was selected and the skin anesthetized between the 2 areas. The catheter was then brought through the tunnel, cut to the appropriate length, and the distal ports were attached. Both ports withdrew easily, were then flushed with heparinized saline and filled with concentrated heparin. The catheter was secured at its exit site with a 3-0 nylon suture. The IJ cannulation site was closed with a 4-0 subcuticular stitch. A sterile dressing was applied. The patient tolerated the procedure well and was transferred to the recovery room in stable condition. All needle and sponge counts were correct.  Waverly Ferrari, MD, FACS [facility] [place]  DATE OF OPERATION: 05/23/2015 DATE OF DICTATION: 05/23/2015

## 2015-05-23 NOTE — Procedures (Signed)
Pt seen on HD.  Ap 70  VP 90  BFR 200.  Will try to pull 2l.  DC IV fluids.

## 2015-05-23 NOTE — Progress Notes (Signed)
*  PRELIMINARY RESULTS* Vascular Ultrasound Renal Artery Duplex has been completed.  Preliminary findings:  Technically limited study due to body habitus, poor patient positioning, and heavy breathing. No obvious areas of focal stenosis are identified, however due to image quality limitations, stenosis cannot be completely excluded.  Elevated velocities are noted in the left intrarenal flow, and high resistant flow is noted bilaterally. Etiology unknown. Bilateral abnormal intrarenal indices.    Farrel Demark, RDMS, RVT  05/23/2015, 6:31 PM

## 2015-05-23 NOTE — Consult Note (Signed)
Reason for Consult: low urine output Referring Physician: [name] is an 65 y.o. male.  HPI: 65 yo male 2 days out from aortobifem bypass. Today he has had increasing creatinine with decreasing urine output. CT scan was unremarkable for evidence of hematoma/bleeding. Bladder pressures have been obtained and ranged from first 12 to now 22. Patient has some abdominal pain but is breathing well.  Past Medical History  Diagnosis Date  . Hyperlipidemia     takes Tricor and Vytorin daily  . Chronic kidney disease     Stage III  Moderate  . Glaucoma     uses eye drops nightly  . Hypertension     takes Diovan HCT daily  . Hypertension 04/10/2015  . Peripheral vascular disease ([place])   . History of bronchitis as a child   . Chronic back pain     ruptured disc  . Nocturia   . Cataracts, bilateral     immature    Past Surgical History  Procedure Laterality Date  . Peripheral vascular catheterization N/A 03/13/2015    Procedure: Abdominal Aortogram;  Surgeon: Conrad [place], MD;  Location: [facility];  Service: Cardiovascular;  Laterality: N/A;  . Aorta - bilateral femoral artery bypass graft N/A 05/21/2015    Procedure: AORTOBIFEMORAL BYPASS GRAFT USING HEMASHIELD GOLD 14X8MM X 40CM VASCULAR GRAFT;  Surgeon: Conrad [place], MD;  Location: [facility];  Service: Vascular;  Laterality: N/A;  . Endarterectomy femoral Bilateral 05/21/2015    Procedure: ENDARTERECTOMY BILATERAL FEMORAL ARTERIES;  Surgeon: Conrad [place], MD;  Location: [facility];  Service: Vascular;  Laterality: Bilateral;  . Patch angioplasty Left 05/21/2015    Procedure: LEFT FEMORAL ARTERY PATCH ANGIOPLASTY USING XENOSURE BIOLOGIC PATCH;  Surgeon: Conrad La Marque, MD;  Location: [facility];  Service: Vascular;  Laterality: Left;    Family History  Problem Relation Age of Onset  . Hyperlipidemia Father   . Peripheral vascular disease Father     amputation  . Cancer Brother     colon/bowel cancer  . Hyperlipidemia Brother   .  Cancer Mother   . Liver disease Mother   . Cancer Maternal Grandmother   . Heart disease Paternal Grandmother   . Diabetes Paternal Grandmother     Social History:  reports that he has quit smoking. He has never used smokeless tobacco. He reports that he drinks alcohol. He reports that he does not use illicit drugs.  Allergies: No Known Allergies  Medications: I have reviewed the patient's current medications.  Results for orders placed or performed during the hospital encounter of 05/21/15 (from the past 48 hour(s))  Prepare RBC     Status: None   Collection Time: 05/21/15 10:04 AM  Result Value Ref Range   Order Confirmation ORDER PROCESSED BY BLOOD BANK   CBC     Status: Abnormal   Collection Time: 05/21/15  4:33 PM  Result Value Ref Range   WBC 24.4 (H) 4.0 - 10.5 K/uL   RBC 3.50 (L) 4.22 - 5.81 MIL/uL   Hemoglobin 10.3 (L) 13.0 - 17.0 g/dL   HCT 32.2 (L) 39.0 - 52.0 %   MCV 92.0 78.0 - 100.0 fL   MCH 29.4 26.0 - 34.0 pg   MCHC 32.0 30.0 - 36.0 g/dL   RDW 12.8 11.5 - 15.5 %   Platelets 258 150 - 400 K/uL  APTT     Status: None   Collection Time: 05/21/15  4:33 PM  Result Value  Ref Range   aPTT 26 24 - 37 seconds  Blood gas, arterial     Status: Abnormal   Collection Time: 05/21/15  5:15 PM  Result Value Ref Range   O2 Content 6.0 L/min   Delivery systems SIMPLE MASK    pH, Arterial 7.354 7.350 - 7.450   pCO2 arterial 38.1 35.0 - 45.0 mmHg   pO2, Arterial 176 (H) 80.0 - 100.0 mmHg   Bicarbonate 20.7 20.0 - 24.0 mEq/L   TCO2 21.9 0 - 100 mmol/L   Acid-base deficit 3.9 (H) 0.0 - 2.0 mmol/L   O2 Saturation 99.0 %   Patient temperature 98.6    Collection site A-LINE    Drawn by DRAWN BY RN    Sample type ARTERIAL DRAW   Basic metabolic panel     Status: Abnormal   Collection Time: 05/21/15  6:30 PM  Result Value Ref Range   Sodium 138 135 - 145 mmol/L   Potassium 4.8 3.5 - 5.1 mmol/L   Chloride 104 101 - 111 mmol/L   CO2 20 (L) 22 - 32 mmol/L   Glucose, Bld 186  (H) 65 - 99 mg/dL   BUN 21 (H) 6 - 20 mg/dL   Creatinine, Ser 1.93 (H) 0.61 - 1.24 mg/dL   Calcium 9.0 8.9 - 10.3 mg/dL   GFR calc non Af Amer 35 (L) >60 mL/min   GFR calc Af Amer 41 (L) >60 mL/min    Comment: (NOTE) The eGFR has been calculated using the CKD EPI equation. This calculation has not been validated in all clinical situations. eGFR's persistently <60 mL/min signify possible Chronic Kidney Disease.    Anion gap 14 5 - 15  Protime-INR     Status: None   Collection Time: 05/21/15  6:30 PM  Result Value Ref Range   Prothrombin Time 15.2 11.6 - 15.2 seconds   INR 1.18 0.00 - 1.49  Magnesium     Status: Abnormal   Collection Time: 05/21/15  6:30 PM  Result Value Ref Range   Magnesium 1.5 (L) 1.7 - 2.4 mg/dL  CBC     Status: Abnormal   Collection Time: 05/21/15  6:30 PM  Result Value Ref Range   WBC 21.4 (H) 4.0 - 10.5 K/uL   RBC 3.56 (L) 4.22 - 5.81 MIL/uL   Hemoglobin 10.6 (L) 13.0 - 17.0 g/dL   HCT 32.7 (L) 39.0 - 52.0 %   MCV 91.9 78.0 - 100.0 fL   MCH 29.8 26.0 - 34.0 pg   MCHC 32.4 30.0 - 36.0 g/dL   RDW 12.8 11.5 - 15.5 %   Platelets 223 150 - 400 K/uL  CBC     Status: Abnormal   Collection Time: 05/22/15  5:00 AM  Result Value Ref Range   WBC 16.1 (H) 4.0 - 10.5 K/uL   RBC 3.20 (L) 4.22 - 5.81 MIL/uL   Hemoglobin 9.5 (L) 13.0 - 17.0 g/dL   HCT 29.7 (L) 39.0 - 52.0 %   MCV 92.8 78.0 - 100.0 fL   MCH 29.7 26.0 - 34.0 pg   MCHC 32.0 30.0 - 36.0 g/dL   RDW 13.0 11.5 - 15.5 %   Platelets 219 150 - 400 K/uL  Comprehensive metabolic panel     Status: Abnormal   Collection Time: 05/22/15  5:00 AM  Result Value Ref Range   Sodium 136 135 - 145 mmol/L   Potassium 5.0 3.5 - 5.1 mmol/L   Chloride 101 101 - 111 mmol/L  CO2 24 22 - 32 mmol/L   Glucose, Bld 179 (H) 65 - 99 mg/dL   BUN 26 (H) 6 - 20 mg/dL   Creatinine, Ser 2.20 (H) 0.61 - 1.24 mg/dL   Calcium 8.5 (L) 8.9 - 10.3 mg/dL   Total Protein 5.7 (L) 6.5 - 8.1 g/dL   Albumin 3.0 (L) 3.5 - 5.0 g/dL    AST 32 15 - 41 U/L   ALT 16 (L) 17 - 63 U/L   Alkaline Phosphatase 22 (L) 38 - 126 U/L   Total Bilirubin 0.9 0.3 - 1.2 mg/dL   GFR calc non Af Amer 30 (L) >60 mL/min   GFR calc Af Amer 35 (L) >60 mL/min    Comment: (NOTE) The eGFR has been calculated using the CKD EPI equation. This calculation has not been validated in all clinical situations. eGFR's persistently <60 mL/min signify possible Chronic Kidney Disease.    Anion gap 11 5 - 15  Magnesium     Status: Abnormal   Collection Time: 05/22/15  5:00 AM  Result Value Ref Range   Magnesium 1.6 (L) 1.7 - 2.4 mg/dL  Amylase     Status: None   Collection Time: 05/22/15  5:00 AM  Result Value Ref Range   Amylase 88 28 - 100 U/L  Basic metabolic panel     Status: Abnormal   Collection Time: 05/22/15  4:08 PM  Result Value Ref Range   Sodium 138 135 - 145 mmol/L   Potassium 5.1 3.5 - 5.1 mmol/L   Chloride 106 101 - 111 mmol/L   CO2 21 (L) 22 - 32 mmol/L   Glucose, Bld 145 (H) 65 - 99 mg/dL   BUN 32 (H) 6 - 20 mg/dL   Creatinine, Ser 3.02 (H) 0.61 - 1.24 mg/dL   Calcium 8.3 (L) 8.9 - 10.3 mg/dL   GFR calc non Af Amer 21 (L) >60 mL/min   GFR calc Af Amer 24 (L) >60 mL/min    Comment: (NOTE) The eGFR has been calculated using the CKD EPI equation. This calculation has not been validated in all clinical situations. eGFR's persistently <60 mL/min signify possible Chronic Kidney Disease.    Anion gap 11 5 - 15  CBC     Status: Abnormal   Collection Time: 05/22/15  4:08 PM  Result Value Ref Range   WBC 21.0 (H) 4.0 - 10.5 K/uL   RBC 3.01 (L) 4.22 - 5.81 MIL/uL   Hemoglobin 9.4 (L) 13.0 - 17.0 g/dL   HCT 28.3 (L) 39.0 - 52.0 %   MCV 94.0 78.0 - 100.0 fL   MCH 31.2 26.0 - 34.0 pg   MCHC 33.2 30.0 - 36.0 g/dL   RDW 13.6 11.5 - 15.5 %   Platelets 215 150 - 400 K/uL  Lactic acid, plasma     Status: None   Collection Time: 05/22/15  4:08 PM  Result Value Ref Range   Lactic Acid, Venous 1.4 0.5 - 2.0 mmol/L  Prepare RBC      Status: None   Collection Time: 05/22/15 10:48 PM  Result Value Ref Range   Order Confirmation      ORDER PROCESSED BY BLOOD BANK BB SAMPLE OR UNITS ALREADY AVAILABLE    Ct Abdomen Pelvis Wo Contrast  05/22/2015  CLINICAL DATA:  IP WHO HAD A RENAL BIOFEM W/ GRAFT YESTERDAY NOW HAVING SEVERE ABD PAIN RADIATING INTO TESTICLESPT IS IN ACUTE RENAL FAILURE SO IV CONTRAST WAS NOT GIVEN? AORTAILIAC OBSTRUCTION EXAM: CT ABDOMEN  AND PELVIS WITHOUT CONTRAST TECHNIQUE: Multidetector CT imaging of the abdomen and pelvis was performed following the standard protocol without IV contrast. COMPARISON:  CT, 06/19/2007. FINDINGS: Lung bases: Minimal pleural effusions. Mild dependent subsegmental atelectasis. Heart shows dense coronary artery calcifications. Hepatobiliary: Mild fatty infiltration of the liver. No mass or focal lesion. 2 small dependent gallstones. No acute cholecystitis. No bile duct dilation. Spleen, pancreas and adrenal glands:  Unremarkable. Kidneys, ureters, bladder: No renal masses. Bilateral renal vascular calcifications. No hydronephrosis. Ureters normal course and caliber with no stones. Bladder is decompressed with a Foley catheter. Vascular: An aortobifemoral graft has been placed. It extends from the infrarenal abdominal aorta just below the renal arteries to anastomose with the common femoral arteries. There is some fluid attenuation as well as air within the right inguinal soft tissues extending to the common femoral vessels. There is a smaller amount of air and inflammatory change in the right inguinal region. Fluid attenuation and inflammatory type stranding tracks along the aortoiliac/bifemoral graft, without a focal defined fluid collection to suggest an abscess. There is no evidence of a hematoma within the abdomen or pelvis. These findings are all consistent with the expected day 2 postoperative appearance. Lymph nodes:  No pathologically enlarged lymph nodes. Ascites:  None.  Gastrointestinal: No evidence of bowel obstruction, diffuse adynamic ileus or of bowel inflammation. There are scattered colonic diverticula without diverticulitis. Normal appendix is visualized. Peritoneal cavity: There is a small amount of free intraperitoneal air consistent with postoperative intraperitoneal air. Abdominal wall: Anterior abdominal wall incision. No hernia or fluid collection. IMPRESSION: 1. The patency of the aortoiliac-bifemoral artery graft cannot be assessed on this unenhanced study. 2. Stranding along the graft as well as some fluid attenuation, which is more notable in the inguinal regions, right greater than left, bilateral anterior inguinal soft tissue air, as well as the small amount of intraperitoneal air, is all consistent with the expected postsurgical appearance on postop day 2. No evidence of a postoperative abscess. 3. No evidence of a ureteral stone or obstructive uropathy. 4. Minimal pleural effusions with dependent subsegmental atelectasis. 5. Small gallstones.  No acute cholecystitis. Electronically Signed   By: Lajean Manes M.D.   On: 05/22/2015 21:17   US Renal Port  05/23/2015  CLINICAL DATA:  65 year old male with oliguria EXAM: RENAL / URINARY TRACT ULTRASOUND COMPLETE COMPARISON:  CT dated 05/22/2015 FINDINGS: Evaluation is limited. There was a a as well as surgical band which over the abdomen. The patient was unable to tolerate pressure. Right Kidney: Length: 9 cm. Echogenicity within normal limits. No mass or hydronephrosis visualized. Left Kidney: Length: 10 cm. Echogenicity within normal limits. No mass or hydronephrosis visualized. Bladder: The urinary bladder is decompressed around a port catheter and not well visualized. IMPRESSION: No hydronephrosis or echogenic stone. Electronically Signed   By: Anner Crete M.D.   On: 05/23/2015 00:47   Dg Chest Port 1 View  05/22/2015  CLINICAL DATA:  Status post aortobifemoral bypass graft EXAM: PORTABLE CHEST 1 VIEW  COMPARISON:  05/21/2015 FINDINGS: Cardiac shadow is again mildly enlarged. A nasogastric catheter is seen within the stomach. A right jugular line is again seen. The lungs are well-aerated without focal infiltrate or sizable effusion. No acute abnormality is noted. IMPRESSION: No acute abnormality seen. Electronically Signed   By: Inez Catalina M.D.   On: 05/22/2015 07:50   Dg Chest Port 1 View  05/21/2015  CLINICAL DATA:  Central line placement.  NG tube placement. EXAM: PORTABLE CHEST 1  VIEW COMPARISON:  None. FINDINGS: Lungs are clear.  No pleural effusion or pneumothorax. Cardiomegaly. Right IJ venous catheter terminates in the mid SVC. Enteric tube terminates in the proximal gastric body. IMPRESSION: Right IJ venous catheter terminates in the mid SVC. Enteric tube terminates in the proximal gastric body. Electronically Signed   By: Julian Hy M.D.   On: 05/21/2015 17:36   Dg Abd Portable 1v  05/21/2015  CLINICAL DATA:  NG tube placement, status post AAA repair EXAM: PORTABLE ABDOMEN - 1 VIEW COMPARISON:  None. FINDINGS: Nonobstructive bowel gas pattern. Midline skin staples. Enteric tube terminates in the proximal gastric body. IMPRESSION: Enteric tube terminates in the proximal gastric body. Electronically Signed   By: Julian Hy M.D.   On: 05/21/2015 17:38    Review of Systems  Constitutional: Negative for fever and chills.  HENT: Negative for hearing loss.   Eyes: Negative for blurred vision and double vision.  Respiratory: Positive for shortness of breath. Negative for cough and hemoptysis.   Cardiovascular: Negative for chest pain and palpitations.  Gastrointestinal: Positive for abdominal pain. Negative for nausea and vomiting.  Genitourinary:       Low urine output  Musculoskeletal: Negative for myalgias and neck pain.  Skin: Negative for itching and rash.  Neurological: Negative for dizziness, tingling and headaches.  Endo/Heme/Allergies: Does not bruise/bleed easily.   Psychiatric/Behavioral: Negative for depression and suicidal ideas.   Blood pressure 106/66, pulse 108, temperature 98.2 F (36.8 C), temperature source Oral, resp. rate 9, height 5' 6"  (1.676 m), weight 93.622 kg (206 lb 6.4 oz), SpO2 95 %. Physical Exam  Constitutional: He is oriented to person, place, and time. He appears well-developed and well-nourished.  HENT:  Head: Normocephalic and atraumatic.  Eyes: Conjunctivae are normal. Pupils are equal, round, and reactive to light.  Neck: Normal range of motion. Neck supple.  Cardiovascular: Tachycardia present.   Respiratory: Breath sounds normal.  GI: He exhibits distension. He exhibits no mass. There is tenderness. There is no rebound.  Distended, tender to palpation, but soft, not tense  Musculoskeletal: He exhibits no edema or tenderness.  Neurological: He is alert and oriented to person, place, and time.  Skin: Skin is warm and dry.  Psychiatric: He has a normal mood and affect. His behavior is normal.   Assessment/Plan: 65 yo male with postoperative oliguria. He did not have a massive resuscitation and his abdomen is not tense and he has no respiratory distress. To fully work up the possibility of intraabdominal hypertension, I would recommend intubation, sedation with paralysis (cysatracurium most likely in this setting) and then recheck a bladder pressure again before proceeding with decompressive laparotomy.  Arta Bruce Vandana Haman 05/23/2015, 2:53 AM

## 2015-05-23 NOTE — Progress Notes (Addendum)
   Daily Progress Note  Assessment/Planning: POD #2 s/p ABF, B iliofemoral EA, L fem EA, UHR   NEURO: switch to dilaudid with good response  CV: tachycardia, likely due to pain, stable hemodynamics  GI: evidence of GI motility on CT scan, abd exam improved from previous, Gen surgery feels low index of suspicion for compartment syndrome  REN: unclear to me etiology of acute renal failure, no response to lasix, aortic cross clamp was almost 3 cm below the level of the renal arteries and there was no thrombus at the aortic neck, will get Nephrology involved this AM  HEME: transfused 2 u pRBC, AM labs pending   Subjective  - 2 Days Post-Op  Pain control better  Objective Filed Vitals:   05/23/15 0215 05/23/15 0230 05/23/15 0235 05/23/15 0245  BP: 104/74 125/54 125/54 106/66  Pulse: 113  106 108  Temp:      TempSrc:      Resp: Height:      Weight:      SpO2: 99%  98% 95%    Intake/Output Summary (Last 24 hours) at 05/23/15 0310 Last data filed at 05/23/15 0235  Gross per 24 hour  Intake 4742.5 ml  Output   1400 ml  Net 3342.5 ml    PULM  BLL rales due to suboptimal inspiration CV  RR, tachycardiac GI  soft, appropriately TTP, no G/R (improved exam) VASC  Easily dopplerable distal pedal vessels  Laboratory CBC    Component Value Date/Time   WBC 21.0* 05/22/2015 1608   HGB 9.4* 05/22/2015 1608   HCT 28.3* 05/22/2015 1608   PLT 215 05/22/2015 1608    BMET    Component Value Date/Time   NA 138 05/22/2015 1608   K 5.1 05/22/2015 1608   CL 106 05/22/2015 1608   CO2 21* 05/22/2015 1608   GLUCOSE 145* 05/22/2015 1608   BUN 32* 05/22/2015 1608   CREATININE 3.02* 05/22/2015 1608   CALCIUM 8.3* 05/22/2015 1608   GFRNONAA 21* 05/22/2015 1608   GFRAA 24* 05/22/2015 1608    Leonides Sake, MD [facility] Office: [phone] Pager: [phone]  05/23/2015, 3:10 AM  Addendum  Laboratory: CBC:    Component Value  Date/Time   WBC 27.7* 05/23/2015 0600   RBC 3.50* 05/23/2015 0600   HGB 10.4* 05/23/2015 0600   HCT 32.7* 05/23/2015 0600   PLT 217 05/23/2015 0600   MCV 93.4 05/23/2015 0600   MCH 29.7 05/23/2015 0600   MCHC 31.8 05/23/2015 0600   RDW 14.8 05/23/2015 0600    BMP:    Component Value Date/Time   NA 134* 05/23/2015 0420   K 6.1* 05/23/2015 0420   CL 106 05/23/2015 0420   CO2 22 05/23/2015 0420   GLUCOSE 176* 05/23/2015 0420   BUN 37* 05/23/2015 0420   CREATININE 4.75* 05/23/2015 0420   CALCIUM 7.9* 05/23/2015 0420   GFRNONAA 12* 05/23/2015 0420   GFRAA 14* 05/23/2015 0420    - MIVF corrected, not certain why K contain fluid was given as I don't normally use such - Hyperkalemia temporized with 1/2 amp D50, 10 units regular insulin, 1 amp Calcium gluconate - Renal consult called - Abd exam continues to be improved from previous but pt has essential become anuric - Work-up on ARF so far non-diagnostic   Leonides Sake, MD [facility] Office: [phone] Pager: [phone]  05/23/2015, 8:27 AM

## 2015-05-23 NOTE — H&P (View-Only) (Signed)
   Daily Progress Note  Appreciate Nephrology's help with Robert [name].  Dr. Edilia Bo will placed tunneled dialysis catheter today.  I updated the patient's wife on his status over the phone.  The risks of tunneled dialysis catheter placement include but are not limited to: bleeding, infection, central venous injury, pneumothorax, possible venous stenosis, possible malpositioning in the venous system, and possible infections related to long-term catheter presence.  The patient's wife was made aware of these risks and agreed to proceed on behalf of the patient.   Leonides Sake, MD [facility] Office: [phone] Pager: [phone]  05/23/2015, 12:59 PM

## 2015-05-23 NOTE — Progress Notes (Signed)
     Called in nephrology consult this am.    Thomasena Edis, Khiem Gargis [facility] PA-C

## 2015-05-23 NOTE — Consult Note (Signed)
Robert [name] is an 65 y.o. male referred by Dr Imogene Burn   Chief Complaint: ARF, hyperkalemia HPI: 65yo WM with hx CKD 3 who is SP Aortobifem bypass and bilat iliofemoral endarterectomy on 05/21/15.  Since surgery UO has progressively decreased and Scr has increased from 1.5 to 4.75.   Had periods of hypotension yest with SBP dropping to upper 80's to low 90's.  K 6.1 this AM.  Given Ca and bicarb.  + hx HTN x 16yrs and "pre diabetes".  Was on Valsartan prior to surgery.  Renal US shows  Rt 9cm Lt 10cm no hydro, Nl echogenicity.  Past Medical History  Diagnosis Date  . Hyperlipidemia     takes Tricor and Vytorin daily  . Chronic kidney disease     Stage III  Moderate  . Glaucoma     uses eye drops nightly  . Hypertension     takes Diovan HCT daily  . Hypertension 04/10/2015  . Peripheral vascular disease (HCC)   . History of bronchitis as a child   . Chronic back pain     ruptured disc  . Nocturia   . Cataracts, bilateral     immature  Hx "Pre diabetes"  Past Surgical History  Procedure Laterality Date  . Peripheral vascular catheterization N/A 03/13/2015    Procedure: Abdominal Aortogram;  Surgeon: Fransisco Hertz, MD;  Location: [facility] INVASIVE CV LAB;  Service: Cardiovascular;  Laterality: N/A;  . Aorta - bilateral femoral artery bypass graft N/A 05/21/2015    Procedure: AORTOBIFEMORAL BYPASS GRAFT USING HEMASHIELD GOLD 14X8MM X 40CM VASCULAR GRAFT;  Surgeon: Fransisco Hertz, MD;  Location: [facility] OR;  Service: Vascular;  Laterality: N/A;  . Endarterectomy femoral Bilateral 05/21/2015    Procedure: ENDARTERECTOMY BILATERAL FEMORAL ARTERIES;  Surgeon: Fransisco Hertz, MD;  Location: [facility] OR;  Service: Vascular;  Laterality: Bilateral;  . Patch angioplasty Left 05/21/2015    Procedure: LEFT FEMORAL ARTERY PATCH ANGIOPLASTY USING XENOSURE BIOLOGIC PATCH;  Surgeon: Fransisco Hertz, MD;  Location: [facility] OR;  Service: Vascular;  Laterality: Left;    Family History  Problem Relation Age of Onset  . Hyperlipidemia  Father   . Peripheral vascular disease Father     amputation  . Cancer Brother     colon/bowel cancer  . Hyperlipidemia Brother   . Cancer Mother   . Liver disease Mother   . Cancer Maternal Grandmother   . Heart disease Paternal Grandmother   . Diabetes Paternal Grandmother    Social History:  reports that he has quit smoking. He has never used smokeless tobacco. He reports that he drinks alcohol. He reports that he does not use illicit drugs.  Allergies: No Known Allergies  Medications Prior to Admission  Medication Sig Dispense Refill  . aspirin 81 MG tablet Take 81 mg by mouth daily.    . ciclopirox (LOPROX) 0.77 % cream Apply 1 application topically 2 (two) times daily as needed. Gel    . diphenhydrAMINE (BENADRYL) 25 mg capsule Take 25 mg by mouth at bedtime.    Marland Kitchen ezetimibe-simvastatin (VYTORIN) 10-40 MG per tablet Take 1 tablet by mouth daily.    . fenofibrate (TRICOR) 145 MG tablet Take 145 mg by mouth daily.    . traMADol (ULTRAM) 50 MG tablet Take 50 mg by mouth 2 (two) times daily as needed for moderate pain. For PAIN   PRN    . Travoprost, BAK Free, (TRAVATAN Z) 0.004 % SOLN ophthalmic solution Place 1 drop into both  eyes at bedtime.     . valsartan-hydrochlorothiazide (DIOVAN-HCT) 320-25 MG per tablet Take 1 tablet by mouth daily.    . Ciclopirox 0.77 % gel     . travoprost, benzalkonium, (TRAVATAN) 0.004 % ophthalmic solution Place 1 drop into both eyes at bedtime. Reported on 04/18/2015       Lab Results: UA: No UA this hosp   05/09/15 NL  Recent Labs  05/22/15 0500 05/22/15 1608 05/23/15 0600  WBC 16.1* 21.0* 27.7*  HGB 9.5* 9.4* 10.4*  HCT 29.7* 28.3* 32.7*  PLT 219 215 217   BMET  Recent Labs  05/22/15 0500 05/22/15 1608 05/23/15 0420  NA 136 138 134*  K 5.0 5.1 6.1*  CL 101 106 106  CO2 24 21* 22  GLUCOSE 179* 145* 176*  BUN 26* 32* 37*  CREATININE 2.20* 3.02* 4.75*  CALCIUM 8.5* 8.3* 7.9*   LFT  Recent Labs  05/22/15 0500  PROT 5.7*   ALBUMIN 3.0*  AST 32  ALT 16*  ALKPHOS 22*  BILITOT 0.9   Ct Abdomen Pelvis Wo Contrast  05/22/2015  CLINICAL DATA:  IP WHO HAD A RENAL BIOFEM W/ GRAFT YESTERDAY NOW HAVING SEVERE ABD PAIN RADIATING INTO TESTICLESPT IS IN ACUTE RENAL FAILURE SO IV CONTRAST WAS NOT GIVEN? AORTAILIAC OBSTRUCTION EXAM: CT ABDOMEN AND PELVIS WITHOUT CONTRAST TECHNIQUE: Multidetector CT imaging of the abdomen and pelvis was performed following the standard protocol without IV contrast. COMPARISON:  CT, 06/19/2007. FINDINGS: Lung bases: Minimal pleural effusions. Mild dependent subsegmental atelectasis. Heart shows dense coronary artery calcifications. Hepatobiliary: Mild fatty infiltration of the liver. No mass or focal lesion. 2 small dependent gallstones. No acute cholecystitis. No bile duct dilation. Spleen, pancreas and adrenal glands:  Unremarkable. Kidneys, ureters, bladder: No renal masses. Bilateral renal vascular calcifications. No hydronephrosis. Ureters normal course and caliber with no stones. Bladder is decompressed with a Foley catheter. Vascular: An aortobifemoral graft has been placed. It extends from the infrarenal abdominal aorta just below the renal arteries to anastomose with the common femoral arteries. There is some fluid attenuation as well as air within the right inguinal soft tissues extending to the common femoral vessels. There is a smaller amount of air and inflammatory change in the right inguinal region. Fluid attenuation and inflammatory type stranding tracks along the aortoiliac/bifemoral graft, without a focal defined fluid collection to suggest an abscess. There is no evidence of a hematoma within the abdomen or pelvis. These findings are all consistent with the expected day 2 postoperative appearance. Lymph nodes:  No pathologically enlarged lymph nodes. Ascites:  None. Gastrointestinal: No evidence of bowel obstruction, diffuse adynamic ileus or of bowel inflammation. There are scattered  colonic diverticula without diverticulitis. Normal appendix is visualized. Peritoneal cavity: There is a small amount of free intraperitoneal air consistent with postoperative intraperitoneal air. Abdominal wall: Anterior abdominal wall incision. No hernia or fluid collection. IMPRESSION: 1. The patency of the aortoiliac-bifemoral artery graft cannot be assessed on this unenhanced study. 2. Stranding along the graft as well as some fluid attenuation, which is more notable in the inguinal regions, right greater than left, bilateral anterior inguinal soft tissue air, as well as the small amount of intraperitoneal air, is all consistent with the expected postsurgical appearance on postop day 2. No evidence of a postoperative abscess. 3. No evidence of a ureteral stone or obstructive uropathy. 4. Minimal pleural effusions with dependent subsegmental atelectasis. 5. Small gallstones.  No acute cholecystitis. Electronically Signed   By: Renard Hamper.D.  On: 05/22/2015 21:17   US Renal Port  05/23/2015  CLINICAL DATA:  65 year old male with oliguria EXAM: RENAL / URINARY TRACT ULTRASOUND COMPLETE COMPARISON:  CT dated 05/22/2015 FINDINGS: Evaluation is limited. There was a a as well as surgical band which over the abdomen. The patient was unable to tolerate pressure. Right Kidney: Length: 9 cm. Echogenicity within normal limits. No mass or hydronephrosis visualized. Left Kidney: Length: 10 cm. Echogenicity within normal limits. No mass or hydronephrosis visualized. Bladder: The urinary bladder is decompressed around a port catheter and not well visualized. IMPRESSION: No hydronephrosis or echogenic stone. Electronically Signed   By: Elgie Collard M.D.   On: 05/23/2015 00:47   Dg Chest Port 1 View  05/22/2015  CLINICAL DATA:  Status post aortobifemoral bypass graft EXAM: PORTABLE CHEST 1 VIEW COMPARISON:  05/21/2015 FINDINGS: Cardiac shadow is again mildly enlarged. A nasogastric catheter is seen within the  stomach. A right jugular line is again seen. The lungs are well-aerated without focal infiltrate or sizable effusion. No acute abnormality is noted. IMPRESSION: No acute abnormality seen. Electronically Signed   By: Alcide Clever M.D.   On: 05/22/2015 07:50   Dg Chest Port 1 View  05/21/2015  CLINICAL DATA:  Central line placement.  NG tube placement. EXAM: PORTABLE CHEST 1 VIEW COMPARISON:  None. FINDINGS: Lungs are clear.  No pleural effusion or pneumothorax. Cardiomegaly. Right IJ venous catheter terminates in the mid SVC. Enteric tube terminates in the proximal gastric body. IMPRESSION: Right IJ venous catheter terminates in the mid SVC. Enteric tube terminates in the proximal gastric body. Electronically Signed   By: Charline Bills M.D.   On: 05/21/2015 17:36   Dg Abd Portable 1v  05/21/2015  CLINICAL DATA:  NG tube placement, status post AAA repair EXAM: PORTABLE ABDOMEN - 1 VIEW COMPARISON:  None. FINDINGS: Nonobstructive bowel gas pattern. Midline skin staples. Enteric tube terminates in the proximal gastric body. IMPRESSION: Enteric tube terminates in the proximal gastric body. Electronically Signed   By: Charline Bills M.D.   On: 05/21/2015 17:38    ROS: Appetite decrease + pain from surgery No CP No SOB No diarrhea  PHYSICAL EXAM: Blood pressure 118/59, pulse 102, temperature 98.4 F (36.9 C), temperature source Oral, resp. rate 10, height  (1.676 m), weight 93.622 kg (206 lb 6.4 oz), SpO2 98 %. HEENT: PERRLA EOMI NECK:Rt triple lumen, no jvd LUNGS:decreased BS bases CARDIAC:RRR wo MRG ABD: Rare BS. Sl distention. Diffuse tenderness sec to surgery EXT:No edema Skin: No rash NEURO:CNI Ox3 no asterixis  Assessment: 1. ARF on CKD 3 (baseline Scr 1.5) due to ATN from hypotension in face of ARB 2. SP aortobifem 3. HTN PLAN: 1. Discussed with pt and wife that he has ARF and will most likely need HD but expect this to be temporary and renal fx to eventually  recover. 2. Recheck K, if higher then will need HD today.  If lower then can wait til tomorrow 3. Discussed with Dr Imogene Burn who will arrange HD cath placement 4. Daily labs 5. Give IV lasix for 24-48hr to see if can stimulate UO   Trudee Chirino T 05/23/2015, 12:16 PM

## 2015-05-24 ENCOUNTER — Inpatient Hospital Stay (HOSPITAL_COMMUNITY): Payer: 59

## 2015-05-24 LAB — RENAL FUNCTION PANEL
ALBUMIN: 2.3 g/dL — AB (ref 3.5–5.0)
ANION GAP: 10 (ref 5–15)
BUN: 32 mg/dL — AB (ref 6–20)
CO2: 25 mmol/L (ref 22–32)
Calcium: 7.7 mg/dL — ABNORMAL LOW (ref 8.9–10.3)
Chloride: 106 mmol/L (ref 101–111)
Creatinine, Ser: 4.83 mg/dL — ABNORMAL HIGH (ref 0.61–1.24)
GFR, EST AFRICAN AMERICAN: 13 mL/min — AB (ref >60)
GFR, EST NON AFRICAN AMERICAN: 12 mL/min — AB (ref >60)
Glucose, Bld: 137 mg/dL — ABNORMAL HIGH (ref 65–99)
PHOSPHORUS: 4.4 mg/dL (ref 2.5–4.6)
POTASSIUM: 4.3 mmol/L (ref 3.5–5.1)
Sodium: 141 mmol/L (ref 135–145)

## 2015-05-24 LAB — TYPE AND SCREEN
ABO/RH(D): A POS
Antibody Screen: NEGATIVE
UNIT DIVISION: 0
Unit division: 0
Unit division: 0
Unit division: 0

## 2015-05-24 LAB — CBC
HCT: 24.1 % — ABNORMAL LOW (ref 39.0–52.0)
HEMOGLOBIN: 8.2 g/dL — AB (ref 13.0–17.0)
MCH: 32.2 pg (ref 26.0–34.0)
MCHC: 34 g/dL (ref 30.0–36.0)
MCV: 94.5 fL (ref 78.0–100.0)
PLATELETS: 174 10*3/uL (ref 150–400)
RBC: 2.55 MIL/uL — ABNORMAL LOW (ref 4.22–5.81)
RDW: 15 % (ref 11.5–15.5)
WBC: 18.4 10*3/uL — ABNORMAL HIGH (ref 4.0–10.5)

## 2015-05-24 LAB — MAGNESIUM: MAGNESIUM: 1.7 mg/dL (ref 1.7–2.4)

## 2015-05-24 LAB — HEPATITIS B SURFACE ANTIGEN: HEP B S AG: NEGATIVE

## 2015-05-24 NOTE — Progress Notes (Signed)
   VASCULAR SURGERY ASSESSMENT & PLAN:  * 3 Day Post-Op s/p:  aortobifemoral bypass graft  *  Renal: His creatinine is down. 65 cc of urine last shift. Renal artery duplex shows that the renal arteries are open.  * Cardiac: Hemodynamically stable.  * Acute blood loss anemia. Hemoglobin is 8.2.  * GI: NG tube with only 60 cc last shift. Abdomen does seem a little distended with hypoactive bowel sounds. We'll leave NG tube for now.  * hyperkalemia resolves. Potassium this morning is 4.3.  SUBJECTIVE: Pain seems to be recently well controlled.  PHYSICAL EXAM: Filed Vitals:   05/24/15 0317 05/24/15 0400 05/24/15 0500 05/24/15 0600  BP:  123/62 114/47 113/42  Pulse:  107 103 96  Temp: 98.5 F (36.9 C)     TempSrc: Oral     Resp:  Height:      Weight:      SpO2:  100% 100% 100%   Lungs rhonchi bilaterally. Abdomen distended. Hypoactive bowel sounds. Both feet are warm and well-perfused.  LABS: Lab Results  Component Value Date   WBC 18.4* 05/24/2015   HGB 8.2* 05/24/2015   HCT 24.1* 05/24/2015   MCV 94.5 05/24/2015   PLT 174 05/24/2015   Lab Results  Component Value Date   CREATININE 4.83* 05/24/2015   Lab Results  Component Value Date   INR 1.18 05/21/2015   CBG (last 3)   Recent Labs  05/23/15 1705  GLUCAP 102*    Active Problems:   Aortic stenosis   Cari Caraway Beeper: [phone] 05/24/2015

## 2015-05-24 NOTE — Evaluation (Addendum)
Physical Therapy Evaluation Patient Details Name: Robert [name] MRN: [id number] DOB: Apr 20, 1950 Today's Date: 05/24/2015   History of Present Illness  S/P ABF, B ileofemoral EA, L femoral BPA and repair of umbilical hernia POD 3. PMH:  HLD, CKD, HTN, glaucoma, PVD. s/p insertion of Lft IJ tunneled diaylsis catheter 2/10.  Clinical Impression  Patient presents with functional limitations due to deficits listed in Pt problem list (see below). Pt confused last night and required reorientation about hospital course. Demonstrates decreased strength, deconditioning, balance and endurance due to above. Education re: bracing using pillow for coughing/sneezing to decrease pressure/pain in abdomen. Tolerated SPT to chair with Min A for balance/safety. Pt has support from wife at home. Will follow acutely to maximize independence and mobility prior to return home.     Follow Up Recommendations Home health PT;Supervision for mobility/OOB (pending progress may not need HHPT)    Equipment Recommendations       Recommendations for Other Services OT consult     Precautions / Restrictions Precautions Precautions: Fall Precaution Comments: NG tube Restrictions Weight Bearing Restrictions: No      Mobility  Bed Mobility Overal bed mobility: Needs Assistance Bed Mobility: Supine to Sit     Supine to sit: Min assist;HOB elevated     General bed mobility comments: Min A to elevate trunk to EOB. + dizziness.   Transfers Overall transfer level: Needs assistance Equipment used: 1 person hand held assist Transfers: Sit to/from [employer] Sit to Stand: Min assist;+2 safety/equipment Stand pivot transfers: Min assist;+2 safety/equipment       General transfer comment: Min A to boost from EOB with cues for hand placement/technique. Min A SPT to chair- unsteady on feet and managing lines.  Ambulation/Gait                Stairs            Wheelchair Mobility     Modified Rankin (Stroke Patients Only)       Balance Overall balance assessment: Needs assistance Sitting-balance support: Feet supported;No upper extremity supported Sitting balance-Leahy Scale: Fair Sitting balance - Comments: BUE support initially progressing to 1 UE support. Min A initially progressing to Min guard assist.  Postural control: Posterior lean Standing balance support: During functional activity Standing balance-Leahy Scale: Poor Standing balance comment: Reliant on external support for balance- Min A.                             Pertinent Vitals/Pain Pain Assessment: Faces Faces Pain Scale: Hurts little more Pain Location: abdomen Pain Descriptors / Indicators: Sore Pain Intervention(s): Monitored during session;Repositioned    Home Living Family/patient expects to be discharged to:: Private residence Living Arrangements: Spouse/significant other Available Help at Discharge: Family;Available 24 hours/day (wife works from home) Type of Home: House Home Access: Stairs to enter Entrance Stairs-Rails: None Secretary/administrator of Steps: 4 Home Layout: Two level;1/2 bath on main level Home Equipment: None      Prior Function Level of Independence: Independent         Comments: Works in the yard; drives.      Hand Dominance   Dominant Hand: Right    Extremity/Trunk Assessment   Upper Extremity Assessment: Defer to OT evaluation           Lower Extremity Assessment: Generalized weakness         Communication   Communication: No difficulties  Cognition Arousal/Alertness: Awake/alert Behavior During  Therapy: WFL for tasks assessed/performed Overall Cognitive Status: Within Functional Limits for tasks assessed (Seems to be coming back to baseline; needed to be reoriented to time, date, reason for admission, course of events. Confused last night.)                      General Comments General comments (skin  integrity, edema, etc.): Wife and daughter present in room during session.    Exercises        Assessment/Plan    PT Assessment Patient needs continued PT services  PT Diagnosis Generalized weakness;Acute pain;Difficulty walking   PT Problem List Decreased strength;Pain;Decreased activity tolerance;Decreased balance;Decreased mobility;Decreased knowledge of use of DME;Decreased cognition  PT Treatment Interventions Balance training;Gait training;Functional mobility training;Therapeutic activities;Therapeutic exercise;Patient/family education;Stair training;DME instruction   PT Goals (Current goals can be found in the Care Plan section) Acute Rehab PT Goals Patient Stated Goal: to return to independence and go home PT Goal Formulation: With patient Time For Goal Achievement: 06/07/15 Potential to Achieve Goals: Good    Frequency Min 3X/week   Barriers to discharge Inaccessible home environment Stairs to get into house and to bedroom.    Co-evaluation               End of Session Equipment Utilized During Treatment: Gait belt Activity Tolerance: Patient tolerated treatment well Patient left: in chair;with call bell/phone within reach;with chair alarm set;with family/visitor present Nurse Communication: Mobility status;Other (comment) (transfer technique back to bed.)         Time: 1610-9604 PT Time Calculation (min) (ACUTE ONLY): 26 min   Charges:   PT Evaluation $PT Eval Moderate Complexity: 1 Procedure PT Treatments $Therapeutic Activity: 8-22 mins   PT G Codes:        Robert [name] 05/24/2015, 10:44 AM Mylo Red, PT, DPT [phone]

## 2015-05-24 NOTE — Progress Notes (Signed)
S: CO abd pain.  Became confused last night O:BP 113/42 mmHg  Pulse 96  Temp(Src) 98.5 F (36.9 C) (Oral)  Resp 8  Ht  (1.676 m)  Wt 93.622 kg (206 lb 6.4 oz)  BMI 33.33 kg/m2  SpO2 100%  Intake/Output Summary (Last 24 hours) at 05/24/15 0740 Last data filed at 05/24/15 0600  Gross per 24 hour  Intake 429.67 ml  Output    595 ml  Net -165.33 ml   Weight change:  Gen: sleeping but arousable CVS:RRR Resp:decreased BS bases Abd:No BS, + distention, + tenderness Ext: tr edema NEURO: CNI No asterixis   . antiseptic oral rinse  7 mL Mouth Rinse q12n4p  . chlorhexidine  15 mL Mouth Rinse BID  . docusate sodium  100 mg Oral Daily  . enoxaparin (LOVENOX) injection  30 mg Subcutaneous Q24H  . furosemide  160 mg Intravenous Q6H  . HYDROmorphone   Intravenous 6 times per day  . latanoprost  1 drop Both Eyes QHS  . pantoprazole (PROTONIX) IV  40 mg Intravenous QHS  . sodium chloride flush  10-40 mL Intracatheter Q12H   Ct Abdomen Pelvis Wo Contrast  05/22/2015  CLINICAL DATA:  IP WHO HAD A RENAL BIOFEM W/ GRAFT YESTERDAY NOW HAVING SEVERE ABD PAIN RADIATING INTO TESTICLESPT IS IN ACUTE RENAL FAILURE SO IV CONTRAST WAS NOT GIVEN? AORTAILIAC OBSTRUCTION EXAM: CT ABDOMEN AND PELVIS WITHOUT CONTRAST TECHNIQUE: Multidetector CT imaging of the abdomen and pelvis was performed following the standard protocol without IV contrast. COMPARISON:  CT, 06/19/2007. FINDINGS: Lung bases: Minimal pleural effusions. Mild dependent subsegmental atelectasis. Heart shows dense coronary artery calcifications. Hepatobiliary: Mild fatty infiltration of the liver. No mass or focal lesion. 2 small dependent gallstones. No acute cholecystitis. No bile duct dilation. Spleen, pancreas and adrenal glands:  Unremarkable. Kidneys, ureters, bladder: No renal masses. Bilateral renal vascular calcifications. No hydronephrosis. Ureters normal course and caliber with no stones. Bladder is decompressed with a Foley catheter.  Vascular: An aortobifemoral graft has been placed. It extends from the infrarenal abdominal aorta just below the renal arteries to anastomose with the common femoral arteries. There is some fluid attenuation as well as air within the right inguinal soft tissues extending to the common femoral vessels. There is a smaller amount of air and inflammatory change in the right inguinal region. Fluid attenuation and inflammatory type stranding tracks along the aortoiliac/bifemoral graft, without a focal defined fluid collection to suggest an abscess. There is no evidence of a hematoma within the abdomen or pelvis. These findings are all consistent with the expected day 2 postoperative appearance. Lymph nodes:  No pathologically enlarged lymph nodes. Ascites:  None. Gastrointestinal: No evidence of bowel obstruction, diffuse adynamic ileus or of bowel inflammation. There are scattered colonic diverticula without diverticulitis. Normal appendix is visualized. Peritoneal cavity: There is a small amount of free intraperitoneal air consistent with postoperative intraperitoneal air. Abdominal wall: Anterior abdominal wall incision. No hernia or fluid collection. IMPRESSION: 1. The patency of the aortoiliac-bifemoral artery graft cannot be assessed on this unenhanced study. 2. Stranding along the graft as well as some fluid attenuation, which is more notable in the inguinal regions, right greater than left, bilateral anterior inguinal soft tissue air, as well as the small amount of intraperitoneal air, is all consistent with the expected postsurgical appearance on postop day 2. No evidence of a postoperative abscess. 3. No evidence of a ureteral stone or obstructive uropathy. 4. Minimal pleural effusions with dependent subsegmental atelectasis. 5.  Small gallstones.  No acute cholecystitis. Electronically Signed   By: Amie Portland M.D.   On: 05/22/2015 21:17   US Renal Port  05/23/2015  CLINICAL DATA:  65 year old male with  oliguria EXAM: RENAL / URINARY TRACT ULTRASOUND COMPLETE COMPARISON:  CT dated 05/22/2015 FINDINGS: Evaluation is limited. There was a a as well as surgical band which over the abdomen. The patient was unable to tolerate pressure. Right Kidney: Length: 9 cm. Echogenicity within normal limits. No mass or hydronephrosis visualized. Left Kidney: Length: 10 cm. Echogenicity within normal limits. No mass or hydronephrosis visualized. Bladder: The urinary bladder is decompressed around a port catheter and not well visualized. IMPRESSION: No hydronephrosis or echogenic stone. Electronically Signed   By: Elgie Collard M.D.   On: 05/23/2015 00:47   Dg Abd Portable 1v  05/24/2015  CLINICAL DATA:  Nasogastric tube placement.  Initial encounter. EXAM: PORTABLE ABDOMEN - 1 VIEW COMPARISON:  CT of the abdomen and pelvis performed 05/22/2015 FINDINGS: The patient's enteric tube is noted ending overlying the body of the stomach, with the side port about the gastroesophageal junction. The visualized bowel gas pattern is unremarkable. Scattered air and stool filled loops of colon are seen; no abnormal dilatation of small bowel loops is seen to suggest small bowel obstruction. Residual contrast is noted at the right lower quadrant. No free intra-abdominal air is identified, though evaluation for free air is limited on a single supine view. The visualized osseous structures are within normal limits; the sacroiliac joints are unremarkable in appearance. Skin staples are noted along the midline anterior abdominal wall. IMPRESSION: 1. Enteric tube noted ending overlying the body of the stomach, with the side port about the gastroesophageal junction. 2. Unremarkable bowel gas pattern; no free intra-abdominal air seen. Electronically Signed   By: Roanna Raider M.D.   On: 05/24/2015 04:25   BMET    Component Value Date/Time   NA 141 05/24/2015 0457   K 4.3 05/24/2015 0457   CL 106 05/24/2015 0457   CO2 25 05/24/2015 0457    GLUCOSE 137* 05/24/2015 0457   BUN 32* 05/24/2015 0457   CREATININE 4.83* 05/24/2015 0457   CALCIUM 7.7* 05/24/2015 0457   GFRNONAA 12* 05/24/2015 0457   GFRAA 13* 05/24/2015 0457   CBC    Component Value Date/Time   WBC 18.4* 05/24/2015 0457   RBC 2.55* 05/24/2015 0457   HGB 8.2* 05/24/2015 0457   HCT 24.1* 05/24/2015 0457   PLT 174 05/24/2015 0457   MCV 94.5 05/24/2015 0457   MCH 32.2 05/24/2015 0457   MCHC 34.0 05/24/2015 0457   RDW 15.0 05/24/2015 0457     Assessment: 1. ARF on CKD 3 (baseline Scr 1.5) due to ATN from hypotension in face of ARB 2. SP aortobifem 3. Hx HTN 4. Hyperkalemia, resolved  Plan: 1 Plan HD again today with hope of not doing HD tomorrow 2. Cont IV lasix thru today and if no significant UO then will DC in AM   Kanyon Seibold T

## 2015-05-25 LAB — CBC
HEMATOCRIT: 27.2 % — AB (ref 39.0–52.0)
HEMOGLOBIN: 8.7 g/dL — AB (ref 13.0–17.0)
MCH: 29.8 pg (ref 26.0–34.0)
MCHC: 32 g/dL (ref 30.0–36.0)
MCV: 93.2 fL (ref 78.0–100.0)
Platelets: 199 10*3/uL (ref 150–400)
RBC: 2.92 MIL/uL — ABNORMAL LOW (ref 4.22–5.81)
RDW: 14.6 % (ref 11.5–15.5)
WBC: 14.5 10*3/uL — ABNORMAL HIGH (ref 4.0–10.5)

## 2015-05-25 LAB — RENAL FUNCTION PANEL
ALBUMIN: 2.2 g/dL — AB (ref 3.5–5.0)
Anion gap: 14 (ref 5–15)
BUN: 31 mg/dL — AB (ref 6–20)
CO2: 27 mmol/L (ref 22–32)
Calcium: 8.5 mg/dL — ABNORMAL LOW (ref 8.9–10.3)
Chloride: 100 mmol/L — ABNORMAL LOW (ref 101–111)
Creatinine, Ser: 5.14 mg/dL — ABNORMAL HIGH (ref 0.61–1.24)
GFR calc Af Amer: 13 mL/min — ABNORMAL LOW (ref >60)
GFR calc non Af Amer: 11 mL/min — ABNORMAL LOW (ref >60)
GLUCOSE: 134 mg/dL — AB (ref 65–99)
PHOSPHORUS: 4 mg/dL (ref 2.5–4.6)
POTASSIUM: 3.8 mmol/L (ref 3.5–5.1)
SODIUM: 141 mmol/L (ref 135–145)

## 2015-05-25 LAB — HEPATITIS B SURFACE ANTIBODY,QUALITATIVE: Hep B S Ab: NONREACTIVE

## 2015-05-25 LAB — HEPATITIS B CORE ANTIBODY, TOTAL: HEP B C TOTAL AB: NEGATIVE

## 2015-05-25 NOTE — Progress Notes (Signed)
S: CO abd pain improved O:BP 121/61 mmHg  Pulse 94  Temp(Src) 98.6 F (37 C) (Oral)  Resp 13  Ht  (1.676 m)  Wt 92.2 kg (203 lb 4.2 oz)  BMI 32.82 kg/m2  SpO2 100%  Intake/Output Summary (Last 24 hours) at 05/25/15 0809 Last data filed at 05/25/15 0618  Gross per 24 hour  Intake    132 ml  Output   1324 ml  Net  -1192 ml   Weight change:  Gen: sleeping but arousable CVS:RRR Resp:decreased BS bases Abd: + BS, no distention.  Tender around surgical wounds Ext:  no edema NEURO: CNI No asterixis   . antiseptic oral rinse  7 mL Mouth Rinse q12n4p  . chlorhexidine  15 mL Mouth Rinse BID  . docusate sodium  100 mg Oral Daily  . enoxaparin (LOVENOX) injection  30 mg Subcutaneous Q24H  . furosemide  160 mg Intravenous Q6H  . HYDROmorphone   Intravenous 6 times per day  . latanoprost  1 drop Both Eyes QHS  . pantoprazole (PROTONIX) IV  40 mg Intravenous QHS  . sodium chloride flush  10-40 mL Intracatheter Q12H   Dg Chest Port 1 View  05/24/2015  CLINICAL DATA:  Shortness of breath earlier today. EXAM: PORTABLE CHEST 1 VIEW COMPARISON:  05/22/2015 ; 05/21/2015; CT abdomen pelvis -05/22/2015 FINDINGS: Grossly unchanged enlarged cardiac silhouette and mediastinal contours. Interval placement of left jugular approach dialysis catheter with tip projected over the superior aspect the right atrium. Otherwise, stable position of support apparatus. Lung volumes remain reduced with grossly unchanged bibasilar opacities, left greater than right. Pulmonary vascular peer slight Li less distinct than present examination with cephalization of flow. Trace bilateral effusions are not excluded. No pneumothorax. Unchanged bones. IMPRESSION: 1. Interval placement of left jugular approach dialysis catheter with tip projected over the superior aspect the right atrium. Otherwise, stable position of support apparatus. No pneumothorax. 2. Suspected mildly worsened pulmonary edema. 3. Similar findings of  hypoventilation and bibasilar opacities, likely atelectasis. Electronically Signed   By: Simonne Come M.D.   On: 05/24/2015 07:48   Dg Abd Portable 1v  05/24/2015  CLINICAL DATA:  Nasogastric tube placement.  Initial encounter. EXAM: PORTABLE ABDOMEN - 1 VIEW COMPARISON:  CT of the abdomen and pelvis performed 05/22/2015 FINDINGS: The patient's enteric tube is noted ending overlying the body of the stomach, with the side port about the gastroesophageal junction. The visualized bowel gas pattern is unremarkable. Scattered air and stool filled loops of colon are seen; no abnormal dilatation of small bowel loops is seen to suggest small bowel obstruction. Residual contrast is noted at the right lower quadrant. No free intra-abdominal air is identified, though evaluation for free air is limited on a single supine view. The visualized osseous structures are within normal limits; the sacroiliac joints are unremarkable in appearance. Skin staples are noted along the midline anterior abdominal wall. IMPRESSION: 1. Enteric tube noted ending overlying the body of the stomach, with the side port about the gastroesophageal junction. 2. Unremarkable bowel gas pattern; no free intra-abdominal air seen. Electronically Signed   By: Roanna Raider M.D.   On: 05/24/2015 04:25   BMET    Component Value Date/Time   NA 141 05/25/2015 0345   K 3.8 05/25/2015 0345   CL 100* 05/25/2015 0345   CO2 27 05/25/2015 0345   GLUCOSE 134* 05/25/2015 0345   BUN 31* 05/25/2015 0345   CREATININE 5.14* 05/25/2015 0345   CALCIUM 8.5* 05/25/2015 0345  GFRNONAA 11* 05/25/2015 0345   GFRAA 13* 05/25/2015 0345   CBC    Component Value Date/Time   WBC 14.5* 05/25/2015 0345   RBC 2.92* 05/25/2015 0345   HGB 8.7* 05/25/2015 0345   HCT 27.2* 05/25/2015 0345   PLT 199 05/25/2015 0345   MCV 93.2 05/25/2015 0345   MCH 29.8 05/25/2015 0345   MCHC 32.0 05/25/2015 0345   RDW 14.6 05/25/2015 0345     Assessment: 1. ARF on CKD 3  (baseline Scr 1.5) due to ATN from hypotension in face of ARB 2. SP aortobifem 3. Hx HTN 4. Hyperkalemia, resolved  Plan: 1 Made a little urine so will cont IV lasix for now 2. Plan HD in AM   Robert [name] T

## 2015-05-25 NOTE — Progress Notes (Signed)
   VASCULAR SURGERY ASSESSMENT & PLAN:  * 4 Days Post-Op s/p: Aortobifemoral bypass graft  * Renal: His creatinine is stable.  U/O = 185 cc yesterday. Renal following.  Renal artery duplex shows that the renal arteries are open.  * Cardiac: Hemodynamically stable.  * Acute blood loss anemia. Hemoglobin is 8.7 (improved)  * GI: He has some bowel sounds. NG tube with 400 cc for 24 hours. We'll leave NG tube for now.  * Hyperkalemia resolves. Potassium this morning is 3.8  *  Transfer to 3 S  * Physical therapy starting tomorrow possibly.   SUBJECTIVE: Comfortable. No specific complaints. No BM. No nausea.   PHYSICAL EXAM: Filed Vitals:   05/25/15 0400 05/25/15 0500 05/25/15 0600 05/25/15 0700  BP: 128/56 137/55 122/46 121/61  Pulse: 93 97 99 94  Temp:      TempSrc:      Resp: Height:      Weight:      SpO2: 97% 95% 100% 100%   Abdomen: some Bowel sounds. Dressings are all dry. Lungs clear Feet warm.   LABS: Lab Results  Component Value Date   WBC 14.5* 05/25/2015   HGB 8.7* 05/25/2015   HCT 27.2* 05/25/2015   MCV 93.2 05/25/2015   PLT 199 05/25/2015   Lab Results  Component Value Date   CREATININE 5.14* 05/25/2015   Lab Results  Component Value Date   INR 1.18 05/21/2015   CBG (last 3)   Recent Labs  05/23/15 1705  GLUCAP 102*    Active Problems:   Aortic stenosis    Cari Caraway Beeper: [phone] 05/25/2015

## 2015-05-26 ENCOUNTER — Encounter (HOSPITAL_COMMUNITY): Payer: Self-pay | Admitting: Vascular Surgery

## 2015-05-26 LAB — C DIFFICILE QUICK SCREEN W PCR REFLEX
C DIFFICILE (CDIFF) INTERP: NEGATIVE
C DIFFICILE (CDIFF) TOXIN: NEGATIVE
C Diff antigen: NEGATIVE

## 2015-05-26 LAB — POCT I-STAT 4, (NA,K, GLUC, HGB,HCT)
Glucose, Bld: 240 mg/dL — ABNORMAL HIGH (ref 65–99)
HCT: 30 % — ABNORMAL LOW (ref 39.0–52.0)
HEMOGLOBIN: 10.2 g/dL — AB (ref 13.0–17.0)
POTASSIUM: 6.1 mmol/L — AB (ref 3.5–5.1)
SODIUM: 134 mmol/L — AB (ref 135–145)

## 2015-05-26 LAB — GLUCOSE, CAPILLARY
GLUCOSE-CAPILLARY: 100 mg/dL — AB (ref 65–99)
GLUCOSE-CAPILLARY: 147 mg/dL — AB (ref 65–99)
GLUCOSE-CAPILLARY: 146 mg/dL — AB (ref 65–99)

## 2015-05-26 LAB — RENAL FUNCTION PANEL
Albumin: 2.3 g/dL — ABNORMAL LOW (ref 3.5–5.0)
Anion gap: 14 (ref 5–15)
BUN: 56 mg/dL — AB (ref 6–20)
CALCIUM: 8.8 mg/dL — AB (ref 8.9–10.3)
CHLORIDE: 96 mmol/L — AB (ref 101–111)
CO2: 29 mmol/L (ref 22–32)
CREATININE: 5.12 mg/dL — AB (ref 0.61–1.24)
GFR, EST AFRICAN AMERICAN: 13 mL/min — AB (ref >60)
GFR, EST NON AFRICAN AMERICAN: 11 mL/min — AB (ref >60)
Glucose, Bld: 115 mg/dL — ABNORMAL HIGH (ref 65–99)
Phosphorus: 3.9 mg/dL (ref 2.5–4.6)
Potassium: 3.1 mmol/L — ABNORMAL LOW (ref 3.5–5.1)
SODIUM: 139 mmol/L (ref 135–145)

## 2015-05-26 LAB — CBC
HEMATOCRIT: 26.9 % — AB (ref 39.0–52.0)
HEMOGLOBIN: 8.8 g/dL — AB (ref 13.0–17.0)
MCH: 29.8 pg (ref 26.0–34.0)
MCHC: 32.7 g/dL (ref 30.0–36.0)
MCV: 91.2 fL (ref 78.0–100.0)
Platelets: 225 10*3/uL (ref 150–400)
RBC: 2.95 MIL/uL — AB (ref 4.22–5.81)
RDW: 14.1 % (ref 11.5–15.5)
WBC: 10.4 10*3/uL (ref 4.0–10.5)

## 2015-05-26 MED ORDER — CICLOPIROX OLAMINE 0.77 % EX CREA: 1.0000 "application " | TOPICAL_CREAM | Freq: Two times a day (BID) | CUTANEOUS | Status: DC | PRN

## 2015-05-26 MED ORDER — DIPHENHYDRAMINE HCL 25 MG PO CAPS
25.0000 mg | ORAL_CAPSULE | Freq: Every day | ORAL | Status: DC
  Administered 2015-05-26 – 2015-05-31 (×6): 25 mg via ORAL
  Filled 2015-05-26 (×6): qty 1

## 2015-05-26 MED ORDER — VALSARTAN-HYDROCHLOROTHIAZIDE 320-25 MG PO TABS: 1.0000 | ORAL_TABLET | Freq: Every day | ORAL | Status: DC

## 2015-05-26 MED ORDER — SODIUM CHLORIDE 0.9 % IV SOLN: INTRAVENOUS | Status: DC

## 2015-05-26 MED ORDER — ASPIRIN EC 81 MG PO TBEC
81.0000 mg | DELAYED_RELEASE_TABLET | Freq: Every day | ORAL | Status: DC
Start: 2015-05-26 — End: 2015-06-01
  Administered 2015-05-26 – 2015-06-01 (×7): 81 mg via ORAL
  Filled 2015-05-26 (×7): qty 1

## 2015-05-26 MED ORDER — EZETIMIBE-SIMVASTATIN 10-40 MG PO TABS
1.0000 | ORAL_TABLET | Freq: Every day | ORAL | Status: DC
  Administered 2015-05-26 – 2015-06-01 (×7): 1 via ORAL
  Filled 2015-05-26 (×8): qty 1

## 2015-05-26 MED ORDER — SODIUM CHLORIDE 0.9% FLUSH
10.0000 mL | Freq: Two times a day (BID) | INTRAVENOUS | Status: DC
  Administered 2015-05-26 – 2015-05-27 (×2): 10 mL via INTRAVENOUS

## 2015-05-26 MED ORDER — OXYCODONE-ACETAMINOPHEN 5-325 MG PO TABS
1.0000 | ORAL_TABLET | ORAL | Status: DC | PRN
  Administered 2015-05-28: 2 via ORAL
  Administered 2015-05-28 – 2015-05-29 (×3): 1 via ORAL
  Administered 2015-05-29 – 2015-05-30 (×2): 2 via ORAL
  Administered 2015-05-30 – 2015-05-31 (×5): 1 via ORAL
  Filled 2015-05-26: qty 2
  Filled 2015-05-26: qty 1
  Filled 2015-05-26: qty 2
  Filled 2015-05-26 (×2): qty 1
  Filled 2015-05-26: qty 2
  Filled 2015-05-26: qty 1
  Filled 2015-05-26: qty 2
  Filled 2015-05-26 (×2): qty 1
  Filled 2015-05-26: qty 2

## 2015-05-26 MED ORDER — IRBESARTAN 300 MG PO TABS
300.0000 mg | ORAL_TABLET | Freq: Every day | ORAL | Status: DC
  Administered 2015-05-26: 300 mg via ORAL
  Filled 2015-05-26 (×2): qty 1

## 2015-05-26 MED ORDER — HYDROCHLOROTHIAZIDE 25 MG PO TABS
25.0000 mg | ORAL_TABLET | Freq: Every day | ORAL | Status: DC
  Administered 2015-05-26: 25 mg via ORAL
  Filled 2015-05-26 (×2): qty 1

## 2015-05-26 MED ORDER — FENOFIBRATE 54 MG PO TABS
54.0000 mg | ORAL_TABLET | Freq: Every day | ORAL | Status: DC
  Administered 2015-05-26 – 2015-06-01 (×7): 54 mg via ORAL
  Filled 2015-05-26 (×8): qty 1

## 2015-05-26 NOTE — Anesthesia Postprocedure Evaluation (Signed)
Anesthesia Post Note  Patient: Robert [name]  Procedure(s) Performed: Procedure(s) (LRB): AORTOBIFEMORAL BYPASS GRAFT USING HEMASHIELD GOLD 14X8MM X 40CM VASCULAR GRAFT (N/A) ENDARTERECTOMY BILATERAL FEMORAL ARTERIES (Bilateral) LEFT FEMORAL ARTERY PATCH ANGIOPLASTY USING XENOSURE BIOLOGIC PATCH (Left)  Patient location during evaluation: PACU Anesthesia Type: General Level of consciousness: awake and alert Pain management: pain level controlled Vital Signs Assessment: post-procedure vital signs reviewed and stable Respiratory status: spontaneous breathing, nonlabored ventilation, respiratory function stable and patient connected to nasal cannula oxygen Cardiovascular status: blood pressure returned to baseline and stable Postop Assessment: no signs of nausea or vomiting Anesthetic complications: no    Last Vitals:  Filed Vitals:   05/26/15 0402 05/26/15 0500  BP:    Pulse:  88  Temp: 36.4 C   Resp:  19    Last Pain:  Filed Vitals:   05/26/15 0865  PainSc: Asleep                 Shelton Silvas

## 2015-05-26 NOTE — Progress Notes (Signed)
   VASCULAR SURGERY ASSESSMENT & PLAN:  * 5 Days Post-Op s/p: Aortobifemoral bypass graft  * Renal: His creatinine is stable. For HD today  * Cardiac: Hemodynamically stable.  * GI: He has some bowel sounds. NG tube out. Start diet slowly.   * Hyperkalemia resolves.   * Transfer to 2W  * Physical therapy.   SUBJECTIVE: comfortable. No nausea. Passing flatus. Hungry.  PHYSICAL EXAM: Filed Vitals:   05/26/15 0815 05/26/15 0820 05/26/15 0830 05/26/15 0900  BP: 143/69 152/68 158/71 155/67  Pulse: 80 78 80 81  Temp: 96.9 F (36.1 C)     TempSrc: Oral     Resp: 14     Height:      Weight: 206 lb 9.1 oz (93.7 kg)     SpO2: 94%      Abdomen: Good bowel sounds. Incisions look fine. Peter warm.  LABS: Lab Results  Component Value Date   WBC 10.4 05/26/2015   HGB 8.8* 05/26/2015   HCT 26.9* 05/26/2015   MCV 91.2 05/26/2015   PLT 225 05/26/2015   Lab Results  Component Value Date   CREATININE 5.12* 05/26/2015   Lab Results  Component Value Date   INR 1.18 05/21/2015   CBG (last 3)   Recent Labs  05/23/15 1705 05/26/15 0751  GLUCAP 102* 100*    Active Problems:   Aortic stenosis    Cari Caraway Beeper: [phone] 05/26/2015

## 2015-05-26 NOTE — Progress Notes (Signed)
OT Cancellation Note  Patient Details Name: Robert [name] MRN: [id number] DOB: 1950-06-08   Cancelled Treatment:    Reason Eval/Treat Not Completed: Patient at procedure or test/ unavailable - Pt at HD this am.  Will reattempt this pm as schedule permits.   Yanky Vanderburg [place], OTR/L [phone]   Jeani Hawking M 05/26/2015, 9:34 AM

## 2015-05-26 NOTE — Progress Notes (Signed)
PT Cancellation Note  Patient Details Name: Robert [name] MRN: [id number] DOB: January 31, 1951   Cancelled Treatment:    Reason Eval/Treat Not Completed: Patient at procedure or test/unavailable. Pt at HD. PT to return as able.   Marcene Brawn 05/26/2015, 7:57 AM   Lewis Shock, PT, DPT Pager #: [phone] Office #: [phone]

## 2015-05-26 NOTE — Progress Notes (Signed)
05/26/2015 1230 Received pt to room 2w32 from 29M.  Pt is A&O, no c/o voiced.  Tele monitor applied and CCMD notified.  Oriented to room, call light and bed.  Call bell in reach. Kathryne Hitch

## 2015-05-26 NOTE — Progress Notes (Signed)
05/26/2015 5:07 PM Pt has had several loose stools.  Pt placed on enteric precautions and sample sent to lab. Kathryne Hitch

## 2015-05-26 NOTE — Progress Notes (Signed)
Utilization review completed.  

## 2015-05-26 NOTE — Procedures (Signed)
Tol HD good UOP now. No hemodynamic issues.  WIll hold on HD in AM and follow UOP for recovery. Leydi Winstead C

## 2015-05-27 LAB — RENAL FUNCTION PANEL
ALBUMIN: 2.4 g/dL — AB (ref 3.5–5.0)
Anion gap: 17 — ABNORMAL HIGH (ref 5–15)
BUN: 42 mg/dL — AB (ref 6–20)
CHLORIDE: 96 mmol/L — AB (ref 101–111)
CO2: 23 mmol/L (ref 22–32)
CREATININE: 4.54 mg/dL — AB (ref 0.61–1.24)
Calcium: 8.9 mg/dL (ref 8.9–10.3)
GFR, EST AFRICAN AMERICAN: 15 mL/min — AB (ref >60)
GFR, EST NON AFRICAN AMERICAN: 13 mL/min — AB (ref >60)
Glucose, Bld: 174 mg/dL — ABNORMAL HIGH (ref 65–99)
PHOSPHORUS: 3.1 mg/dL (ref 2.5–4.6)
POTASSIUM: 2.9 mmol/L — AB (ref 3.5–5.1)
Sodium: 136 mmol/L (ref 135–145)

## 2015-05-27 LAB — GLUCOSE, CAPILLARY
Glucose-Capillary: 128 mg/dL — ABNORMAL HIGH (ref 65–99)
Glucose-Capillary: 121 mg/dL — ABNORMAL HIGH (ref 65–99)

## 2015-05-27 MED ORDER — PANTOPRAZOLE SODIUM 40 MG PO TBEC
40.0000 mg | DELAYED_RELEASE_TABLET | Freq: Every day | ORAL | Status: DC
  Administered 2015-05-27 – 2015-05-31 (×5): 40 mg via ORAL
  Filled 2015-05-27 (×5): qty 1

## 2015-05-27 MED ORDER — FUROSEMIDE 80 MG PO TABS
160.0000 mg | ORAL_TABLET | Freq: Two times a day (BID) | ORAL | Status: DC
  Administered 2015-05-27 – 2015-05-28 (×4): 160 mg via ORAL
  Filled 2015-05-27 (×2): qty 2
  Filled 2015-05-27: qty 4
  Filled 2015-05-27 (×2): qty 2

## 2015-05-27 MED ORDER — POTASSIUM CHLORIDE CRYS ER 20 MEQ PO TBCR
40.0000 meq | EXTENDED_RELEASE_TABLET | Freq: Every day | ORAL | Status: DC
  Administered 2015-05-28: 40 meq via ORAL
  Filled 2015-05-27: qty 2

## 2015-05-27 MED ORDER — POTASSIUM CHLORIDE CRYS ER 20 MEQ PO TBCR
40.0000 meq | EXTENDED_RELEASE_TABLET | Freq: Two times a day (BID) | ORAL | Status: AC
  Administered 2015-05-27 (×2): 40 meq via ORAL
  Filled 2015-05-27: qty 4
  Filled 2015-05-27: qty 2

## 2015-05-27 NOTE — Evaluation (Signed)
Occupational Therapy Evaluation Patient Details Name: Robert [name] MRN: [id number] DOB: 1950-08-19 Today's Date: 05/27/2015    History of Present Illness S/P ABF, B ileofemoral EA, L femoral BPA and repair of umbilical hernia POD 3. PMH:  HLD, CKD, HTN, glaucoma, PVD. s/p insertion of Lft IJ tunneled diaylsis catheter 2/10.   Clinical Impression   Pt was independent prior to admission.  Presents with minimal incisional pain. Requiring min guard assist to ambulate with RW and perform standing ADL. Requires min assist for LB ADL Will follow acutely. Do not anticipate pt with need follow up OT.  Follow Up Recommendations  No OT follow up    Equipment Recommendations  3 in 1 bedside comode    Recommendations for Other Services       Precautions / Restrictions Precautions Precautions: Fall Restrictions Weight Bearing Restrictions: No      Mobility Bed Mobility Overal bed mobility: Modified Independent             General bed mobility comments: heavy reliance on UEs, HOB up  Transfers Overall transfer level: Needs assistance Equipment used: Rolling walker (2 wheeled) Transfers: Sit to/from Stand Sit to Stand: Min guard Stand pivot transfers: Min guard            Balance                                            ADL Overall ADL's : Needs assistance/impaired Eating/Feeding: Independent;Sitting   Grooming: Wash/dry hands;Min guard;Standing   Upper Body Bathing: Set up;Sitting   Lower Body Bathing: Minimal assistance;Sit to/from stand   Upper Body Dressing : Set up;Sitting   Lower Body Dressing: Minimal assistance;Sit to/from stand   Toilet Transfer: Min guard;Stand-pivot;BSC   Toileting- Architect and Hygiene: Min guard;Sit to/from stand       Functional mobility during ADLs: Engineer, technical sales     Praxis      Pertinent Vitals/Pain Pain Assessment: Faces Faces Pain Scale:  Hurts a little bit Pain Location: groin Pain Descriptors / Indicators: Operative site guarding;Grimacing Pain Intervention(s): Monitored during session     Hand Dominance Right   Extremity/Trunk Assessment Upper Extremity Assessment Upper Extremity Assessment: Overall WFL for tasks assessed   Lower Extremity Assessment Lower Extremity Assessment: Defer to PT evaluation       Communication Communication Communication: No difficulties   Cognition Arousal/Alertness: Awake/alert Behavior During Therapy: WFL for tasks assessed/performed Overall Cognitive Status: Within Functional Limits for tasks assessed                     General Comments       Exercises       Shoulder Instructions      Home Living Family/patient expects to be discharged to:: Private residence Living Arrangements: Spouse/significant other Available Help at Discharge: Family;Available 24 hours/day (wife works from home) Type of Home: House Home Access: Stairs to enter Secretary/administrator of Steps: 4 Entrance Stairs-Rails: None Home Layout: Two level;1/2 bath on main level Alternate Level Stairs-Number of Steps: flight Alternate Level Stairs-Rails: Right Bathroom Shower/Tub: Tub/shower unit;Walk-in shower   Bathroom Toilet: Standard     Home Equipment: None          Prior Functioning/Environment Level of Independence: Independent        Comments: Works in  the yard; drives.     OT Diagnosis: Generalized weakness;Acute pain   OT Problem List: Decreased strength;Decreased activity tolerance;Impaired balance (sitting and/or standing);Decreased knowledge of use of DME or AE;Pain   OT Treatment/Interventions: Self-care/ADL training;DME and/or AE instruction;Patient/family education;Balance training;Therapeutic activities    OT Goals(Current goals can be found in the care plan section) Acute Rehab OT Goals Patient Stated Goal: to return to independence and go home OT Goal  Formulation: With patient Time For Goal Achievement: 06/03/15 Potential to Achieve Goals: Good ADL Goals Pt Will Perform Grooming: with modified independence;standing Pt Will Perform Lower Body Bathing: with modified independence;sit to/from stand;with adaptive equipment Pt Will Perform Lower Body Dressing: with modified independence;with adaptive equipment;sit to/from stand Pt Will Transfer to Toilet: with modified independence;ambulating;bedside commode (over toilet) Pt Will Perform Toileting - Clothing Manipulation and hygiene: with modified independence;sit to/from stand Pt Will Perform Tub/Shower Transfer: Shower transfer;ambulating;3 in 1;rolling walker  OT Frequency: Min 2X/week   Barriers to D/C:            Co-evaluation              End of Session Equipment Utilized During Treatment: Gait belt;Rolling walker  Activity Tolerance: Patient tolerated treatment well Patient left: in chair;with call bell/phone within reach   Time: 1200-1235 OT Time Calculation (min): 35 min Charges:  OT General Charges $OT Visit: 1 Procedure OT Evaluation $OT Eval Moderate Complexity: 1 Procedure OT Treatments $Self Care/Home Management : 8-22 mins G-Codes:    Evern Bio 05/27/2015, 12:46 PM  [phone]

## 2015-05-27 NOTE — Progress Notes (Signed)
Physical Therapy Treatment Patient Details Name: Robert [name] MRN: [id number] DOB: 1951/01/20 Today's Date: 05/27/2015    History of Present Illness S/P ABF, B ileofemoral EA, L femoral BPA and repair of umbilical hernia POD 3. PMH:  HLD, CKD, HTN, glaucoma, PVD. s/p insertion of Lft IJ tunneled diaylsis catheter 2/10.    PT Comments    Pt performed increased gait distance with cues for pacing.  Pt moving with increased ease.  Will plan for stair training next visit to ensure safe entry into home.  Pt remains to c/o pain in abdomen.    Follow Up Recommendations  Home health PT;Supervision for mobility/OOB (still need to assess stair training when pt agreeable.  )     Equipment Recommendations       Recommendations for Other Services       Precautions / Restrictions Precautions Precautions: Fall Restrictions Weight Bearing Restrictions: No    Mobility  Bed Mobility Overal bed mobility: Modified Independent Bed Mobility: Sit to Supine           General bed mobility comments: HOB elevated, remains to rely on UEs for majority of mobility.    Transfers Overall transfer level: Needs assistance Equipment used: Rolling walker (2 wheeled) Transfers: Sit to/from Stand Sit to Stand: Supervision Stand pivot transfers: Min guard       General transfer comment: Cues for hand placement to and from seated surface.  pt demonstrated safe technique with min verbal cues.    Ambulation/Gait Ambulation/Gait assistance: Supervision Ambulation Distance (Feet): 230 Feet Assistive device: Rolling walker (2 wheeled) (required readjustment of RW height to improve comfort with use of RW.  ) Gait Pattern/deviations: Step-through pattern;Decreased stride length;Wide base of support     General Gait Details: Pt performed increased gait distance required cues for pacing and positioning with RW.     Stairs Stairs:  (declined stairs secondary to fatigue.  )          Wheelchair  Mobility    Modified Rankin (Stroke Patients Only)       Balance     Sitting balance-Leahy Scale: Good       Standing balance-Leahy Scale: Good                      Cognition Arousal/Alertness: Awake/alert Behavior During Therapy: WFL for tasks assessed/performed Overall Cognitive Status: Within Functional Limits for tasks assessed                      Exercises      General Comments        Pertinent Vitals/Pain Pain Assessment: 0-10 Pain Score: 5  Faces Pain Scale: Hurts a little bit Pain Location: abdomen during coughing and sneezing.   Pain Descriptors / Indicators: Grimacing;Guarding Pain Intervention(s): Monitored during session    Home Living Family/patient expects to be discharged to:: Private residence Living Arrangements: Spouse/significant other Available Help at Discharge: Family;Available 24 hours/day (wife works from home) Type of Home: House Home Access: Stairs to enter Entrance Stairs-Rails: None Home Layout: Two level;1/2 bath on main level Home Equipment: None      Prior Function Level of Independence: Independent      Comments: Works in the yard; drives.    PT Goals (current goals can now be found in the care plan section) Acute Rehab PT Goals Patient Stated Goal: to return to independence and go home Potential to Achieve Goals: Good Progress towards PT goals: Progressing toward goals  Frequency  Min 3X/week    PT Plan      Co-evaluation             End of Session Equipment Utilized During Treatment: Gait belt Activity Tolerance: Patient tolerated treatment well Patient left: in chair;with call bell/phone within reach;with chair alarm set;with family/visitor present     Time: 1435-1450 PT Time Calculation (min) (ACUTE ONLY): 15 min  Charges:  $Gait Training: 8-22 mins                    G Codes:      Florestine Avers Jun 26, 2015, 3:33 PM Joycelyn Rua, PTA pager [phone]

## 2015-05-27 NOTE — Progress Notes (Addendum)
  AAA Progress Note  05/27/2015 8:27 AM 4 Days Post-Op  Subjective:  Feeling a little bit better.  States he walked in the halls past the nursing station this morning.  Tolerating diet-denies N/V  Afebrile HR  80's NSR 110's-130's systolic 98% RA  Filed Vitals:   05/27/15 0235 05/27/15 0508  BP: 117/42 131/57  Pulse:    Temp: 97.9 F (36.6 C) 98.5 F (36.9 C)  Resp: 16 20   Physical Exam: Cardiac:  regular Lungs:  CTAB Abdomen:  Soft, NT, ND; +BS; +flatus; +loose stool (-C difff) Incisions:  Midline incision is clean and dry with staples in tact.  Unable to evaluate bilateral groin incisions as pt is up in chair. Extremities:  2+ palpable right PT and 2+ palpable left DP  CBC    Component Value Date/Time   WBC 10.4 05/26/2015 0800   RBC 2.95* 05/26/2015 0800   HGB 8.8* 05/26/2015 0800   HCT 26.9* 05/26/2015 0800   PLT 225 05/26/2015 0800   MCV 91.2 05/26/2015 0800   MCH 29.8 05/26/2015 0800   MCHC 32.7 05/26/2015 0800   RDW 14.1 05/26/2015 0800    BMET    Component Value Date/Time   NA 136 05/27/2015 0655   K 2.9* 05/27/2015 0655   CL 96* 05/27/2015 0655   CO2 23 05/27/2015 0655   GLUCOSE 174* 05/27/2015 0655   BUN 42* 05/27/2015 0655   CREATININE 4.54* 05/27/2015 0655   CALCIUM 8.9 05/27/2015 0655   GFRNONAA 13* 05/27/2015 0655   GFRAA 15* 05/27/2015 0655    INR    Component Value Date/Time   INR 1.18 05/21/2015 1830     Intake/Output Summary (Last 24 hours) at 05/27/15 0827 Last data filed at 05/26/15 2200  Gross per 24 hour  Intake    120 ml  Output   2000 ml  Net  -1880 ml     Assessment/Plan:  65 y.o. male is s/p  1. Aortobifemoral bypass (Dacron 14 mm x 8 mm) 2. Bilateral iliofemoral endarterectomy 3. Left iliofemoral bovine patch angioplasty 4. Umbilical hernia repair 4 Days Post-Op  -pt doing well this morning -he does have palpable pedal pulses -tolerating diet without N/V -creatinine improved this morning from 5.12 to 4.54;  renal has put HD on hold given improvement in UOP; ARB/HCT.  Continue foley for strict I/O; discontinued yesterday as well as Fleets enema  -hypokalemia-treatment per renal  -continue to increase ambulation   Doreatha Massed, PA-C [facility] [phone] 05/27/2015 8:27 AM  Agree with above. His renal function appears to be improving. Advance diet. DC central line if a peripheral IV can be started.  Waverly Ferrari, MD, FACS Beeper [phone] Office: [phone]

## 2015-05-27 NOTE — Progress Notes (Signed)
HPI: 65yo WM with hx CKD 3 who is SP Aortobifem bypass and bilat iliofemoral endarterectomy on 05/21/15. Since surgery UO has progressively decreased and Scr has increased from 1.5 to 4.75. Had periods of hypotension yest with SBP dropping to upper 80's to low 90's. K 6.1 this AM. Given Ca and bicarb. + hx HTN x 52yrs and "pre diabetes". Was on Valsartan prior to surgery. Renal US shows Rt 9cm Lt 10cm no hydro, Nl echogenicity. Assessment: 1. ARF on CKD 3 (baseline Scr 1.5) due to ATN from hypotension in face of ARB 2. SP aortobifem 3. Hx HTN 4. Hyperkalemia, resolved  Plan: 1  Will hold on dialysis to see what renal fct does given improvement in UOP (which worsens with dialysis fluid removal) 2  Stop IV diuretics, give PO 3  Stop Valsartan-HCT which was restarted yesterday 4  Stop Fleets phosphate enema   Subjective: Interval History: admits to occ confusion  Objective: Vital signs in last 24 hours: Temp:  [96.9 F (36.1 C)-98.5 F (36.9 C)] 98.5 F (36.9 C) (02/14 0508) Pulse Rate:  [78-91] 90 (02/13 1230) Resp:  [14-20] 20 (02/14 0508) BP: (117-158)/(42-74) 131/57 mmHg (02/14 0508) SpO2:  [94 %-98 %] 98 % (02/14 0508) Weight:  [87.6 kg (193 lb 2 oz)-93.7 kg (206 lb 9.1 oz)] 87.6 kg (193 lb 2 oz) (02/14 0508) Weight change:   Intake/Output from previous day: 02/13 0701 - 02/14 0700 In: 120 [P.O.:120] Out: 2350 [Urine:350] Intake/Output this shift:    General appearance: alert and cooperative Back: negative, symmetric, no curvature. ROM normal. No CVA tenderness. Resp: clear to auscultation bilaterally Cardio: regular rate and rhythm, S1, S2 normal, no murmur, click, rub or gallop Extremities: extremities normal, atraumatic, no cyanosis or edema  Lab Results:  Recent Labs  05/25/15 0345 05/26/15 0800  WBC 14.5* 10.4  HGB 8.7* 8.8*  HCT 27.2* 26.9*  PLT 199 225   BMET:  Recent Labs  05/25/15 0345 05/26/15 0338  NA 141 139  K 3.8 3.1*  CL 100* 96*   CO2 27 29  GLUCOSE 134* 115*  BUN 31* 56*  CREATININE 5.14* 5.12*  CALCIUM 8.5* 8.8*   No results for input(s): PTH in the last 72 hours. Iron Studies: No results for input(s): IRON, TIBC, TRANSFERRIN, FERRITIN in the last 72 hours. Studies/Results: No results found.  Scheduled: . antiseptic oral rinse  7 mL Mouth Rinse q12n4p  . aspirin EC  81 mg Oral Daily  . chlorhexidine  15 mL Mouth Rinse BID  . diphenhydrAMINE  25 mg Oral QHS  . docusate sodium  100 mg Oral Daily  . enoxaparin (LOVENOX) injection  30 mg Subcutaneous Q24H  . ezetimibe-simvastatin  1 tablet Oral Daily  . fenofibrate  54 mg Oral Daily  . furosemide  160 mg Intravenous Q6H  . irbesartan  300 mg Oral Daily   And  . hydrochlorothiazide  25 mg Oral Daily  . latanoprost  1 drop Both Eyes QHS  . pantoprazole (PROTONIX) IV  40 mg Intravenous QHS  . sodium chloride flush  10 mL Intravenous Q12H  . sodium chloride flush  10-40 mL Intracatheter Q12H     LOS: 6 days   Senovia Gauer C 05/27/2015,8:00 AM

## 2015-05-28 LAB — RENAL FUNCTION PANEL
ANION GAP: 15 (ref 5–15)
Albumin: 2.7 g/dL — ABNORMAL LOW (ref 3.5–5.0)
BUN: 62 mg/dL — ABNORMAL HIGH (ref 6–20)
CALCIUM: 9 mg/dL (ref 8.9–10.3)
CHLORIDE: 100 mmol/L — AB (ref 101–111)
CO2: 24 mmol/L (ref 22–32)
Creatinine, Ser: 5.43 mg/dL — ABNORMAL HIGH (ref 0.61–1.24)
GFR calc non Af Amer: 10 mL/min — ABNORMAL LOW (ref >60)
GFR, EST AFRICAN AMERICAN: 12 mL/min — AB (ref >60)
GLUCOSE: 108 mg/dL — AB (ref 65–99)
POTASSIUM: 3.7 mmol/L (ref 3.5–5.1)
Phosphorus: 5.1 mg/dL — ABNORMAL HIGH (ref 2.5–4.6)
SODIUM: 139 mmol/L (ref 135–145)

## 2015-05-28 LAB — GLUCOSE, CAPILLARY: Glucose-Capillary: 108 mg/dL — ABNORMAL HIGH (ref 65–99)

## 2015-05-28 MED ORDER — DIPHENOXYLATE-ATROPINE 2.5-0.025 MG PO TABS
1.0000 | ORAL_TABLET | Freq: Four times a day (QID) | ORAL | Status: DC | PRN
  Administered 2015-05-28 (×2): 1 via ORAL
  Filled 2015-05-28 (×2): qty 1

## 2015-05-28 NOTE — Progress Notes (Signed)
HPI: 65yo WM with hx CKD 3 who is SP Aortobifem bypass and bilat iliofemoral endarterectomy on 05/21/15. Since surgery UO has progressively decreased and Scr has increased from 1.5. Had periods of hypotension yest with SBP dropping to upper 80's to low 90's. K 6.1 this AM. Given Ca and bicarb. + hx HTN x 36yrs and "pre diabetes". Was on Valsartan prior to surgery. Renal US shows Rt 9cm Lt 10cm no hydro, Nl echogenicity. Assessment: 1. ARF on CKD 3 (baseline Scr 1.5) due to ATN from hypotension in face of ARB 2. SP aortobifem 3. Hx HTN 4. Hyperkalemia, resolved  Plan: 1 Will hold on dialysis to see what renal fct does given improvement in UOP  Subjective: Interval History: has no complaint of grits.  Objective: Vital signs in last 24 hours: Temp:  [97.4 F (36.3 C)-97.7 F (36.5 C)] 97.7 F (36.5 C) (02/15 0601) Pulse Rate:  [72] 72 (02/14 2153) Resp:  [18] 18 (02/15 0601) BP: (109-124)/(47-52) 109/47 mmHg (02/15 0601) SpO2:  [97 %-98 %] 98 % (02/15 0601) Weight:  [87.8 kg (193 lb 9 oz)] 87.8 kg (193 lb 9 oz) (02/15 0618) Weight change: -5.9 kg (-13 lb 0.1 oz)  Intake/Output from previous day: 02/14 0701 - 02/15 0700 In: 360 [P.O.:360] Out: 455 [Urine:455] Intake/Output this shift:    General appearance: alert and cooperative Head: Normocephalic, without obvious abnormality, atraumatic Chest wall: no tenderness, left IJ Cardio: regular rate and rhythm, S1, S2 normal, no murmur, click, rub or gallop GI: post op tender Extremities: edema tr  Lab Results:  Recent Labs  05/26/15 0800  WBC 10.4  HGB 8.8*  HCT 26.9*  PLT 225   BMET:  Recent Labs  05/27/15 0655 05/28/15 0545  NA 136 139  K 2.9* 3.7  CL 96* 100*  CO2 23 24  GLUCOSE 174* 108*  BUN 42* 62*  CREATININE 4.54* 5.43*  CALCIUM 8.9 9.0   No results for input(s): PTH in the last 72 hours. Iron Studies: No results for input(s): IRON, TIBC, TRANSFERRIN, FERRITIN in the last 72  hours. Studies/Results: No results found.  Scheduled: . antiseptic oral rinse  7 mL Mouth Rinse q12n4p  . aspirin EC  81 mg Oral Daily  . chlorhexidine  15 mL Mouth Rinse BID  . diphenhydrAMINE  25 mg Oral QHS  . docusate sodium  100 mg Oral Daily  . enoxaparin (LOVENOX) injection  30 mg Subcutaneous Q24H  . ezetimibe-simvastatin  1 tablet Oral Daily  . fenofibrate  54 mg Oral Daily  . furosemide  160 mg Oral BID  . latanoprost  1 drop Both Eyes QHS  . pantoprazole  40 mg Oral QHS  . potassium chloride  40 mEq Oral Daily     LOS: 7 days   Robert [name] C 05/28/2015,8:18 AM

## 2015-05-28 NOTE — Progress Notes (Signed)
Occupational Therapy Treatment Patient Details Name: Robert [name] MRN: [id number] DOB: 1950-07-03 Today's Date: 05/28/2015    History of present illness S/P ABF, B ileofemoral EA, L femoral BPA and repair of umbilical hernia POD 3. PMH:  HLD, CKD, HTN, glaucoma, PVD. s/p insertion of Lft IJ tunneled diaylsis catheter 2/10.   OT comments  Pt eager to go home. Feels he is getting weaker with inactivity in hospital.  Pt performing toileting, standing grooming and LB dressing with supervision and use of adaptive equipment.  Follow Up Recommendations  No OT follow up    Equipment Recommendations  3 in 1 bedside comode    Recommendations for Other Services      Precautions / Restrictions Precautions Precautions: Fall       Mobility Bed Mobility Overal bed mobility: Modified Independent                Transfers Overall transfer level: Needs assistance Equipment used: Rolling walker (2 wheeled)   Sit to Stand: Supervision         General transfer comment: no physical assist, cues for hand placement sit<>stand.    Balance                                   ADL Overall ADL's : Needs assistance/impaired     Grooming: Wash/dry hands;Standing;Supervision/safety         Lower Body Bathing Details (indicate cue type and reason): educated in use of long handled bath sponge     Lower Body Dressing: Supervision/safety;With adaptive equipment;Sit to/from stand Lower Body Dressing Details (indicate cue type and reason): educated in use of reacher and sock aide Toilet Transfer: Supervision/safety;Ambulation;RW;Comfort height toilet   Toileting- Clothing Manipulation and Hygiene: Supervision/safety;Sit to/from stand       Functional mobility during ADLs: Supervision/safety;Rolling walker General ADL Comments: Pt eager to walk in hall.      Vision                     Perception     Praxis      Cognition   Behavior During Therapy: WFL  for tasks assessed/performed Overall Cognitive Status: Within Functional Limits for tasks assessed                       Extremity/Trunk Assessment               Exercises     Shoulder Instructions       General Comments      Pertinent Vitals/ Pain       Pain Assessment: Faces Faces Pain Scale: Hurts a little bit Pain Location: incisions/groin Pain Descriptors / Indicators: Operative site guarding;Grimacing;Sore  Home Living                                          Prior Functioning/Environment              Frequency Min 2X/week     Progress Toward Goals  OT Goals(current goals can now be found in the care plan section)  Progress towards OT goals: Progressing toward goals     Plan Discharge plan remains appropriate    Co-evaluation                 End of Session  Equipment Utilized During Treatment: Gait belt;Rolling walker   Activity Tolerance Patient tolerated treatment well   Patient Left in bed;with call bell/phone within reach   Nurse Communication          Time: 1200-1220 OT Time Calculation (min): 20 min  Charges: OT General Charges $OT Visit: 1 Procedure OT Treatments $Self Care/Home Management : 8-22 mins  Evern Bio 05/28/2015, 2:26 PM  [phone]

## 2015-05-28 NOTE — Progress Notes (Signed)
Frequent small loose/ watery stools - x 5 since MN and c/o discomfort rectum d/t stools; last small amount @ 0830 = 30 cc. Stool neg C Diff 05/26/15. Will tx page PA to address and request intervention.

## 2015-05-28 NOTE — Progress Notes (Addendum)
   Daily Progress Note  Assessment/Planning: POD #7 s/p ABF POD #5 s/p LIJV TDC  Acute renal failure due to ARB/hypotension Anemia related to hemodilution and intraoperative bleeding   Inc look good  Diarrhea expected given PO contrast load given by Radiology, C.Diff negative  ARF: appreciate Nephrology's assistance.  Will continue to observe for renal recovery, net I/O currently +1.7 L   PT/OT: home PT recommended, OT equipment noted, continue PT/OT while inpatient  Pt will need continued hospitalization with continued oliguria (only 455 cc/24 hour)  Awaiting ABI   Subjective   Pain controlled, urinating some, +diarrhea  Objective Filed Vitals:   05/27/15 0508 05/27/15 2153 05/28/15 0601 05/28/15 0618  BP: 131/57 124/52 109/47   Pulse:  72    Temp: 98.5 F (36.9 C) 97.4 F (36.3 C) 97.7 F (36.5 C)   TempSrc: Axillary Oral Oral   Resp: Height:      Weight: 193 lb 2 oz (87.6 kg)   193 lb 9 oz (87.8 kg)  SpO2: 98% 97% 98%     Intake/Output Summary (Last 24 hours) at 05/28/15 0808 Last data filed at 05/28/15 0644  Gross per 24 hour  Intake    360 ml  Output    455 ml  Net    -95 ml    PULM  CTAB CV  RRR GI  soft, appropriate TTP, -G/R, distension greatly decreased, umbilical hernia not evident, inc c/d/i, staples in place VASC  Warm feet with faintly palpable pedal pulses, inc c/d/i B, no drainage from either groin  Laboratory CBC    Component Value Date/Time   WBC 10.4 05/26/2015 0800   HGB 8.8* 05/26/2015 0800   HCT 26.9* 05/26/2015 0800   PLT 225 05/26/2015 0800    BMET    Component Value Date/Time   NA 139 05/28/2015 0545   K 3.7 05/28/2015 0545   CL 100* 05/28/2015 0545   CO2 24 05/28/2015 0545   GLUCOSE 108* 05/28/2015 0545   BUN 62* 05/28/2015 0545   CREATININE 5.43* 05/28/2015 0545   CALCIUM 9.0 05/28/2015 0545   GFRNONAA 10* 05/28/2015 0545   GFRAA 12* 05/28/2015 0545    Leonides Sake, MD [facility] [place] Office: [phone] Pager: [phone]  05/28/2015, 8:08 AM

## 2015-05-28 NOTE — Progress Notes (Signed)
Physical Therapy Treatment Patient Details Name: KARSON REEDE MRN: [id number] DOB: 02/05/1951 Today's Date: 05/28/2015    History of Present Illness S/P ABF, B ileofemoral EA, L femoral BPA and repair of umbilical hernia POD 3. PMH:  HLD, CKD, HTN, glaucoma, PVD. s/p insertion of Lft IJ tunneled diaylsis catheter 2/10.    PT Comments    Pt was not willing and able to try steps today but could definitely benefit from early session in the AM to try to get this done before going home.  Pt has taken two walks in recent time and states his nurse has encouraged him to be OOB as has PT.  Will try again tomorrow as pt is able to walk.  Follow Up Recommendations  Home health PT;Supervision for mobility/OOB (has not climbed stairs today)     Equipment Recommendations       Recommendations for Other Services       Precautions / Restrictions Precautions Precautions: Fall Restrictions Weight Bearing Restrictions: No    Mobility  Bed Mobility Overal bed mobility: Modified Independent             General bed mobility comments: did not get OOB but scooted up in bed  Transfers Overall transfer level: Needs assistance Equipment used: Rolling walker (2 wheeled)   Sit to Stand: Supervision         General transfer comment: declined due to pain from recent walk  Ambulation/Gait             General Gait Details: Declined due to pain from recent walk   Stairs            Wheelchair Mobility    Modified Rankin (Stroke Patients Only)       Balance     Sitting balance-Leahy Scale: Good                              Cognition Arousal/Alertness: Awake/alert Behavior During Therapy: WFL for tasks assessed/performed Overall Cognitive Status: Within Functional Limits for tasks assessed                      Exercises      General Comments General comments (skin integrity, edema, etc.): Pt using a pillow to guard abdomen, but can  definitely assist with moving legs.  Pt too painful from 2 recent walks to get up with PT      Pertinent Vitals/Pain Pain Assessment: Faces Faces Pain Scale: Hurts little more Pain Location: abdomen Pain Descriptors / Indicators: Operative site guarding Pain Intervention(s): Limited activity within patient's tolerance;Monitored during session;Repositioned;RN gave pain meds during session    Home Living                      Prior Function            PT Goals (current goals can now be found in the care plan section) Acute Rehab PT Goals Patient Stated Goal: walk and get home Progress towards PT goals: Progressing toward goals    Frequency  Min 3X/week    PT Plan Current plan remains appropriate    Co-evaluation             End of Session   Activity Tolerance: Patient tolerated treatment well Patient left: in bed;with call bell/phone within reach     Time: 1435-1458 PT Time Calculation (min) (ACUTE ONLY): 23 min  Charges:  $Therapeutic Exercise:  23-37 mins $Therapeutic Activity: 23-37 mins                    G Codes:      Ivar Drape Jun 19, 2015, 3:49 PM   Samul Dada, PT MS Acute Rehab Dept. Number: [id number] and [facility] [phone]

## 2015-05-29 ENCOUNTER — Inpatient Hospital Stay (HOSPITAL_COMMUNITY): Payer: 59

## 2015-05-29 DIAGNOSIS — I739 Peripheral vascular disease, unspecified: Secondary | ICD-10-CM

## 2015-05-29 LAB — RENAL FUNCTION PANEL
ALBUMIN: 2.9 g/dL — AB (ref 3.5–5.0)
ANION GAP: 13 (ref 5–15)
BUN: 84 mg/dL — ABNORMAL HIGH (ref 6–20)
CALCIUM: 9.3 mg/dL (ref 8.9–10.3)
CO2: 23 mmol/L (ref 22–32)
Chloride: 101 mmol/L (ref 101–111)
Creatinine, Ser: 5.99 mg/dL — ABNORMAL HIGH (ref 0.61–1.24)
GFR calc non Af Amer: 9 mL/min — ABNORMAL LOW (ref >60)
GFR, EST AFRICAN AMERICAN: 10 mL/min — AB (ref >60)
Glucose, Bld: 115 mg/dL — ABNORMAL HIGH (ref 65–99)
PHOSPHORUS: 6.4 mg/dL — AB (ref 2.5–4.6)
Potassium: 4.6 mmol/L (ref 3.5–5.1)
SODIUM: 137 mmol/L (ref 135–145)

## 2015-05-29 MED ORDER — FUROSEMIDE 80 MG PO TABS: 80.0000 mg | ORAL_TABLET | Freq: Every day | ORAL | Status: DC

## 2015-05-29 NOTE — Progress Notes (Signed)
HPI: 65yo WM with hx CKD 3 who is SP Aortobifem bypass and bilat iliofemoral endarterectomy on 05/21/15. Since surgery UO has progressively decreased and Scr has increased from 1.5. Had periods of hypotension yest with SBP dropping to upper 80's to low 90's. K 6.1 this AM. Given Ca and bicarb. + hx HTN x 80yrs and "pre diabetes". Was on Valsartan prior to surgery. Renal US shows Rt 9cm Lt 10cm no hydro, Nl echogenicity. Assessment: 1. ARF on CKD 3 (baseline Scr 1.5) due to ATN from hypotension in face of ARB 2. SP aortobifem 3. Hx HTN 4. Hyperkalemia, resolved  Plan: 1 Will cont to hold on dialysis to see what renal fct does given improvement in UOP 2 DC foley 3 Hold furosemide & K  Subjective: Interval History: Increased UOP  Objective: Vital signs in last 24 hours: Temp:  [97.6 F (36.4 C)-98.1 F (36.7 C)] 97.8 F (36.6 C) (02/16 0500) Pulse Rate:  [86-94] 86 (02/16 0500) Resp:  [18-19] 18 (02/16 0500) BP: (116-140)/(45-61) 140/45 mmHg (02/16 0500) SpO2:  [99 %-100 %] 100 % (02/16 0500) Weight:  [88.769 kg (195 lb 11.2 oz)] 88.769 kg (195 lb 11.2 oz) (02/16 0345) Weight change: 0.969 kg (2 lb 2.2 oz)  Intake/Output from previous day: 02/15 0701 - 02/16 0700 In: 840 [P.O.:840] Out: 500 [Urine:400; Stool:100] Intake/Output this shift:    General appearance: alert and cooperative Resp: sl decreased BS bilat Cardio: regular rate and rhythm, S1, S2 normal, no murmur, click, rub or gallop Extremities: edema trace  Lab Results:  Recent Labs  05/26/15 0800  WBC 10.4  HGB 8.8*  HCT 26.9*  PLT 225   BMET:  Recent Labs  05/28/15 0545 05/29/15 0350  NA 139 137  K 3.7 4.6  CL 100* 101  CO2 24 23  GLUCOSE 108* 115*  BUN 62* 84*  CREATININE 5.43* 5.99*  CALCIUM 9.0 9.3   No results for input(s): PTH in the last 72 hours. Iron Studies: No results for input(s): IRON, TIBC, TRANSFERRIN, FERRITIN in the last 72 hours. Studies/Results: No results  found.  Scheduled: . antiseptic oral rinse  7 mL Mouth Rinse q12n4p  . aspirin EC  81 mg Oral Daily  . chlorhexidine  15 mL Mouth Rinse BID  . diphenhydrAMINE  25 mg Oral QHS  . docusate sodium  100 mg Oral Daily  . enoxaparin (LOVENOX) injection  30 mg Subcutaneous Q24H  . ezetimibe-simvastatin  1 tablet Oral Daily  . fenofibrate  54 mg Oral Daily  . furosemide  160 mg Oral BID  . latanoprost  1 drop Both Eyes QHS  . pantoprazole  40 mg Oral QHS  . potassium chloride  40 mEq Oral Daily    LOS: 8 days   Lavanya Roa C 05/29/2015,7:05 AM

## 2015-05-29 NOTE — Progress Notes (Signed)
VASCULAR LAB PRELIMINARY  ARTERIAL  ABI completed: Bilateral waveforms appeared and sound normal. 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     RIGHT    LEFT    PRESSURE WAVEFORM  PRESSURE WAVEFORM  BRACHIAL 146 Normal BRACHIAL 142 Normal  DP 100 triphasic DP 120 Triphasic   PT 108 biphasic PT 113 Triphasic  PER   PER    GREAT TOE  NA GREAT TOE  NA    RIGHT LEFT  ABI 0.74 0.82     Delorise Hunkele D, RVT 05/29/2015, 3:24 PM

## 2015-05-29 NOTE — Progress Notes (Signed)
Utilization review completed.  

## 2015-05-29 NOTE — Progress Notes (Signed)
Physical Therapy Treatment Patient Details Name: Robert [name] MRN: [id number] DOB: 1950-11-17 Today's Date: 05/29/2015    History of Present Illness S/P ABF, B ileofemoral EA, L femoral BPA and repair of umbilical hernia POD 3. PMH:  HLD, CKD, HTN, glaucoma, PVD. s/p insertion of Lft IJ tunneled diaylsis catheter 2/10.    PT Comments    Pt performed flight of stair to fullfill POC requirement.  Pt progressing well with ambulation will not require HHPT at d/c ( supervising PT informed).  Pt requiring min guard with stair training with noticeable DOE after negotiation of flight of stairs.  Will attempt stairs x 1 more visit to advance pt on stair training.    Follow Up Recommendations  No PT follow up     Equipment Recommendations  Rolling walker with 5" wheels    Recommendations for Other Services       Precautions / Restrictions Precautions Precautions: Fall Restrictions Weight Bearing Restrictions: No    Mobility  Bed Mobility Overal bed mobility:  (Pt recieved ambulating in halls without assistance.  )                Transfers Overall transfer level:  (Pt recieved ambulating in halls without assistance.  )                  Ambulation/Gait Ambulation/Gait assistance: Modified independent (Device/Increase time) Ambulation Distance (Feet):  (unlimited pt observed ambulating halls without assistance, gait goal met.  ) Assistive device: Rolling walker (2 wheeled) Gait Pattern/deviations: WFL(Within Functional Limits)         Stairs Stairs: Yes Stairs assistance: Min guard   Number of Stairs: 12 General stair comments: Pt performed stair training with unilateral rail support to simulate home environment.  Pt required cues for safety with use of rail and sequencing to maintain safety.  Pt fatigued post stair training.  Please attempt stairs one more time before d/c as d/c plans remains to be in 2-3 days.    Wheelchair Mobility    Modified Rankin  (Stroke Patients Only)       Balance     Sitting balance-Leahy Scale: Good       Standing balance-Leahy Scale: Good                      Cognition Arousal/Alertness: Awake/alert Behavior During Therapy: WFL for tasks assessed/performed Overall Cognitive Status: Within Functional Limits for tasks assessed                      Exercises      General Comments        Pertinent Vitals/Pain Pain Score: 3  Pain Location: lower abdomen Pain Intervention(s): Monitored during session    Home Living                      Prior Function            PT Goals (current goals can now be found in the care plan section) Acute Rehab PT Goals Patient Stated Goal: walk and get home Potential to Achieve Goals: Good Progress towards PT goals: Progressing toward goals (Pt will require update stair goal.  )    Frequency  Min 3X/week (see one more time for stair training then d/c)    PT Plan Current plan remains appropriate    Co-evaluation             End of Session Equipment Utilized During  Treatment: Gait belt Activity Tolerance: Patient tolerated treatment well Patient left:  (left patient ambulating in halls.  )     Time: 0221-7981 PT Time Calculation (min) (ACUTE ONLY): 9 min  Charges:  $Gait Training: 8-22 mins                    G Codes:      Cristela Blue 10-Jun-2015, 1:24 PM  Governor Rooks, PTA pager [phone]

## 2015-05-29 NOTE — Progress Notes (Addendum)
[facility] [place]  Subjective  - He feels well over all, he has walked and is tolerating PO intake without N/V.     Objective 140/45 86 97.8 F (36.6 C) (Oral) 18 100%  Intake/Output Summary (Last 24 hours) at 05/29/15 0754 Last data filed at 05/29/15 0356  Gross per 24 hour  Intake    840 ml  Output    500 ml  Net    340 ml    Incision healing well Feet warm well perfused Abdomin soft   Assessment/Planning: POD # 7 PROCEDURE: 1. Aortobifemoral bypass (Dacron 14 mm x 8 mm) 2. Bilateral iliofemoral endarterectomy 3. Left iliofemoral bovine patch angioplasty 4. Umbilical hernia repair 1. ARF on CKD 3 (baseline Scr 1.5) due to ATN from hypotension in face of ARB  Plan per nephrology Hold potassium and lasix, no HD today, D/C foley trial void   Clinton [facility] 05/29/2015 7:54 AM --  Laboratory Lab Results:  Recent Labs  05/26/15 0800  WBC 10.4  HGB 8.8*  HCT 26.9*  PLT 225   BMET  Recent Labs  05/28/15 0545 05/29/15 0350  NA 139 137  K 3.7 4.6  CL 100* 101  CO2 24 23  GLUCOSE 108* 115*  BUN 62* 84*  CREATININE 5.43* 5.99*  CALCIUM 9.0 9.3    COAG Lab Results  Component Value Date   INR 1.18 05/21/2015   INR 1.08 05/09/2015   No results found for: PTT    Addendum  I have independently interviewed and examined the patient, and I agree with the physician assistant's findings.  Foley out and Lasix off.   Hopefully renal recovery is occuring.  Pt in holding pattern in hospital until then.  Leonides Sake, MD [facility] [place] Office: [phone] Pager: [phone]  05/29/2015, 1:12 PM

## 2015-05-30 LAB — RENAL FUNCTION PANEL
ALBUMIN: 2.6 g/dL — AB (ref 3.5–5.0)
Anion gap: 15 (ref 5–15)
BUN: 93 mg/dL — AB (ref 6–20)
CHLORIDE: 98 mmol/L — AB (ref 101–111)
CO2: 23 mmol/L (ref 22–32)
CREATININE: 5.23 mg/dL — AB (ref 0.61–1.24)
Calcium: 8.9 mg/dL (ref 8.9–10.3)
GFR, EST AFRICAN AMERICAN: 12 mL/min — AB (ref >60)
GFR, EST NON AFRICAN AMERICAN: 11 mL/min — AB (ref >60)
Glucose, Bld: 131 mg/dL — ABNORMAL HIGH (ref 65–99)
PHOSPHORUS: 6.2 mg/dL — AB (ref 2.5–4.6)
POTASSIUM: 4.3 mmol/L (ref 3.5–5.1)
Sodium: 136 mmol/L (ref 135–145)

## 2015-05-30 NOTE — Progress Notes (Addendum)
   Daily Progress Note  Assessment/Planning: POD #9 s/p ABF, B iliofem EA, L fem BPA, POD #7 TDC placement  Acute renal failure   Marked improvement in UOP overnight  Cr still elevated.  Keep watching for renal recovery  All incisions intact.  Staples out early next week  ABI reflect improved flow in both legs (L>>R)  Subjective  - 7 Days Post-Op  Minimal incision pain, moving more, more urine  Objective Filed Vitals:   05/29/15 0500 05/29/15 1031 05/29/15 2002 05/30/15 0429  BP: 140/45 134/49 136/48 132/44  Pulse: 86 79 93 82  Temp: 97.8 F (36.6 C) 97.5 F (36.4 C) 98.2 F (36.8 C) 97.6 F (36.4 C)  TempSrc: Oral Oral Oral Oral  Resp: Height:      Weight:    198 lb 12.8 oz (90.175 kg)  SpO2: 100% 99% 97% 99%    Intake/Output Summary (Last 24 hours) at 05/30/15 0800 Last data filed at 05/30/15 0433  Gross per 24 hour  Intake    720 ml  Output   1150 ml  Net   -430 ml    PULM  CTAB CV  RRR GI  soft, appropriate TTP, -G/R, inc c/d/i, staples in place VASC  B groins c/d/i, staples in place, warm feet BLE ABI   RIGHT    LEFT    PRESSURE WAVEFORM  PRESSURE WAVEFORM  BRACHIAL 146 Normal BRACHIAL 142 Normal  DP 100 triphasic DP 120 Triphasic   PT 108 biphasic PT 113 Triphasic  PER   PER    GREAT TOE  NA GREAT TOE  NA    RIGHT LEFT  ABI 0.74 0.82        Laboratory CBC    Component Value Date/Time   WBC 10.4 05/26/2015 0800   HGB 8.8* 05/26/2015 0800   HCT 26.9* 05/26/2015 0800   PLT 225 05/26/2015 0800    BMET    Component Value Date/Time   NA 136 05/30/2015 0400   K 4.3 05/30/2015 0400   CL 98* 05/30/2015 0400   CO2 23 05/30/2015 0400   GLUCOSE 131* 05/30/2015 0400   BUN 93* 05/30/2015 0400   CREATININE 5.23* 05/30/2015 0400   CALCIUM 8.9 05/30/2015 0400   GFRNONAA 11* 05/30/2015 0400   GFRAA 12* 05/30/2015 0400    Leonides Sake, MD [facility]  [place] Office: [phone] Pager: [phone]  05/30/2015, 8:00 AM

## 2015-05-30 NOTE — Progress Notes (Signed)
Expand All Collapse All   HPI: 64yo WM with hx CKD 3 who is SP Aortobifem bypass and bilat iliofemoral endarterectomy on 05/21/15. Since surgery UO has progressively decreased and Scr has increased from 1.5. Had periods of hypotension yest with SBP dropping to upper 80's to low 90's. K 6.1 this AM. Given Ca and bicarb. + hx HTN x 9yrs and "pre diabetes". Was on Valsartan prior to surgery. Renal US shows Rt 9cm Lt 10cm no hydro, Nl echogenicity. Assessment: 1. ARF on CKD 3 (baseline Scr 1.5) due to ATN from hypotension in face of ARB, recovery phase 2. SP aortobifem 3. Hx HTN 4. Hyperkalemia, resolved  Plan: Monitor renal fct off diuretics       Subjective: Interval History: Voiding w/ foley out  Objective: Vital signs in last 24 hours: Temp:  [97.5 F (36.4 C)-98.2 F (36.8 C)] 97.6 F (36.4 C) (02/17 0429) Pulse Rate:  [79-93] 82 (02/17 0429) Resp:  [16-18] 16 (02/17 0429) BP: (132-136)/(44-49) 132/44 mmHg (02/17 0429) SpO2:  [97 %-99 %] 99 % (02/17 0429) Weight:  [90.175 kg (198 lb 12.8 oz)] 90.175 kg (198 lb 12.8 oz) (02/17 0429) Weight change: 1.406 kg (3 lb 1.6 oz)  Intake/Output from previous day: 02/16 0701 - 02/17 0700 In: 960 [P.O.:960] Out: 1150 [Urine:1150] Intake/Output this shift: Total I/O In: 240 [P.O.:240] Out: 400 [Urine:400]  General appearance: alert and cooperative Extremities: edema tr  Lab Results: No results for input(s): WBC, HGB, HCT, PLT in the last 72 hours. BMET:  Recent Labs  05/29/15 0350 05/30/15 0400  NA 137 136  K 4.6 4.3  CL 101 98*  CO2 23 23  GLUCOSE 115* 131*  BUN 84* 93*  CREATININE 5.99* 5.23*  CALCIUM 9.3 8.9   No results for input(s): PTH in the last 72 hours. Iron Studies: No results for input(s): IRON, TIBC, TRANSFERRIN, FERRITIN in the last 72 hours. Studies/Results: No results found.  Scheduled: . antiseptic oral rinse  7 mL Mouth Rinse q12n4p  . aspirin EC  81 mg Oral Daily  . chlorhexidine  15 mL  Mouth Rinse BID  . diphenhydrAMINE  25 mg Oral QHS  . docusate sodium  100 mg Oral Daily  . enoxaparin (LOVENOX) injection  30 mg Subcutaneous Q24H  . ezetimibe-simvastatin  1 tablet Oral Daily  . fenofibrate  54 mg Oral Daily  . latanoprost  1 drop Both Eyes QHS  . pantoprazole  40 mg Oral QHS    LOS: 9 days   Correne Lalani C 05/30/2015,6:38 AM

## 2015-05-31 LAB — RENAL FUNCTION PANEL
ALBUMIN: 2.6 g/dL — AB (ref 3.5–5.0)
Anion gap: 12 (ref 5–15)
BUN: 96 mg/dL — AB (ref 6–20)
CALCIUM: 8.9 mg/dL (ref 8.9–10.3)
CO2: 23 mmol/L (ref 22–32)
Chloride: 99 mmol/L — ABNORMAL LOW (ref 101–111)
Creatinine, Ser: 4.28 mg/dL — ABNORMAL HIGH (ref 0.61–1.24)
GFR calc Af Amer: 16 mL/min — ABNORMAL LOW (ref >60)
GFR calc non Af Amer: 13 mL/min — ABNORMAL LOW (ref >60)
GLUCOSE: 115 mg/dL — AB (ref 65–99)
PHOSPHORUS: 5.8 mg/dL — AB (ref 2.5–4.6)
Potassium: 4.1 mmol/L (ref 3.5–5.1)
Sodium: 134 mmol/L — ABNORMAL LOW (ref 135–145)

## 2015-05-31 NOTE — Progress Notes (Addendum)
  AAA Progress Note    05/31/2015 10:20 AM 8 Days Post-Op  Subjective:  C/o left groin pain  Afebrile HR 80's-90's NSR 140's-150's systolic 100% RA  Filed Vitals:   05/30/15 2009 05/31/15 0558  BP: 140/52 152/45  Pulse: 91 85  Temp: 97.9 F (36.6 C) 97.5 F (36.4 C)  Resp: 16 17    Physical Exam: Cardiac:  regular Lungs:  Non labored Abdomen:  Soft, NT/ND Incisions:  Midline incision and bilateral groins is clean and dry with staples in tact Extremities:  Palpable right PT pulse; palpable left DP pulse  CBC    Component Value Date/Time   WBC 10.4 05/26/2015 0800   RBC 2.95* 05/26/2015 0800   HGB 8.8* 05/26/2015 0800   HCT 26.9* 05/26/2015 0800   PLT 225 05/26/2015 0800   MCV 91.2 05/26/2015 0800   MCH 29.8 05/26/2015 0800   MCHC 32.7 05/26/2015 0800   RDW 14.1 05/26/2015 0800    BMET    Component Value Date/Time   NA 134* 05/31/2015 0248   K 4.1 05/31/2015 0248   CL 99* 05/31/2015 0248   CO2 23 05/31/2015 0248   GLUCOSE 115* 05/31/2015 0248   BUN 96* 05/31/2015 0248   CREATININE 4.28* 05/31/2015 0248   CALCIUM 8.9 05/31/2015 0248   GFRNONAA 13* 05/31/2015 0248   GFRAA 16* 05/31/2015 0248    INR    Component Value Date/Time   INR 1.18 05/21/2015 1830     Intake/Output Summary (Last 24 hours) at 05/31/15 1020 Last data filed at 05/30/15 1711  Gross per 24 hour  Intake    480 ml  Output    350 ml  Net    130 ml     Assessment/Plan:  65 y.o. male is s/p  1. Aortobifemoral bypass (Dacron 14 mm x 8 mm) 2. Bilateral iliofemoral endarterectomy 3. Left iliofemoral bovine patch angioplasty 4. Umbilical hernia repair 10 Days Post-Op And  Left IJ TDC placement 8 Days Post-Op  -pt doing well this morning -his creatinine continues to improve and he is having good UOP per pt as output was not recorded. -palpable right PT and palpable left DP -continue to mobilize -hopefully home in the next couple of days-home when ok with renal  service -staples out at discharge. -if his renal function continues to improve-may be able to get [facility] out prior to discharge.   Doreatha Massed, PA-C [facility] [phone] 05/31/2015 10:20 AM   Addendum  I have independently interviewed and examined the patient, and I agree with the physician assistant's findings.  Cr improving, pt continues to urinate, I/O not accurate as PM urine not measures.  Hopefully pt can go home soon.  Will D/C staples on D/C from hospital  Leonides Sake, MD [facility] [place] Office: [phone] Pager: [phone]  05/31/2015, 10:26 AM

## 2015-05-31 NOTE — Progress Notes (Signed)
HPI: 65yo WM with hx CKD 3 who is SP Aortobifem bypass and bilat iliofemoral endarterectomy on 05/21/15. Since surgery UO has progressively decreased and Scr has increased from 1.5. Had periods of hypotension yest with SBP dropping to upper 80's to low 90's. K 6.1 this AM. Given Ca and bicarb. + hx HTN x 50yrs and "pre diabetes". Was on Valsartan prior to surgery. Renal US shows Rt 9cm Lt 10cm no hydro, Nl echogenicity. Assessment: 1. ARF on CKD 3 (baseline Scr 1.5) due to ATN from hypotension in face of ARB, recovery phase & improving 2. SP aortobifem 3. Hx HTN Plan: Monitor renal fct off diuretics and ARB  Subjective: Interval History: none.  Objective: Vital signs in last 24 hours: Temp:  [97.5 F (36.4 C)-98.9 F (37.2 C)] 97.5 F (36.4 C) (02/18 0558) Pulse Rate:  [85-91] 85 (02/18 0558) Resp:  [16-17] 17 (02/18 0558) BP: (140-152)/(45-55) 152/45 mmHg (02/18 0558) SpO2:  [98 %-100 %] 100 % (02/18 0558) Weight:  [90.13 kg (198 lb 11.2 oz)] 90.13 kg (198 lb 11.2 oz) (02/18 0558) Weight change: -0.045 kg (-1.6 oz)  Intake/Output from previous day: 02/17 0701 - 02/18 0700 In: 720 [P.O.:720] Out: 650 [Urine:650] Intake/Output this shift:    General appearance: alert and cooperative Extremities: edema tr -1+ on r  Lab Results: No results for input(s): WBC, HGB, HCT, PLT in the last 72 hours. BMET:  Recent Labs  05/30/15 0400 05/31/15 0248  NA 136 134*  K 4.3 4.1  CL 98* 99*  CO2 23 23  GLUCOSE 131* 115*  BUN 93* 96*  CREATININE 5.23* 4.28*  CALCIUM 8.9 8.9   No results for input(s): PTH in the last 72 hours. Iron Studies: No results for input(s): IRON, TIBC, TRANSFERRIN, FERRITIN in the last 72 hours. Studies/Results: No results found.  Scheduled: . antiseptic oral rinse  7 mL Mouth Rinse q12n4p  . aspirin EC  81 mg Oral Daily  . chlorhexidine  15 mL Mouth Rinse BID  . diphenhydrAMINE  25 mg Oral QHS  . docusate sodium  100 mg Oral Daily  .  enoxaparin (LOVENOX) injection  30 mg Subcutaneous Q24H  . ezetimibe-simvastatin  1 tablet Oral Daily  . fenofibrate  54 mg Oral Daily  . latanoprost  1 drop Both Eyes QHS  . pantoprazole  40 mg Oral QHS     LOS: 10 days   Robert [name] C 05/31/2015,9:45 AM

## 2015-06-01 LAB — RENAL FUNCTION PANEL
ALBUMIN: 2.7 g/dL — AB (ref 3.5–5.0)
ANION GAP: 11 (ref 5–15)
BUN: 85 mg/dL — ABNORMAL HIGH (ref 6–20)
CALCIUM: 8.8 mg/dL — AB (ref 8.9–10.3)
CO2: 23 mmol/L (ref 22–32)
Chloride: 101 mmol/L (ref 101–111)
Creatinine, Ser: 3.02 mg/dL — ABNORMAL HIGH (ref 0.61–1.24)
GFR, EST AFRICAN AMERICAN: 24 mL/min — AB (ref >60)
GFR, EST NON AFRICAN AMERICAN: 21 mL/min — AB (ref >60)
GLUCOSE: 118 mg/dL — AB (ref 65–99)
PHOSPHORUS: 4.1 mg/dL (ref 2.5–4.6)
Potassium: 4.2 mmol/L (ref 3.5–5.1)
SODIUM: 135 mmol/L (ref 135–145)

## 2015-06-01 MED ORDER — TRAMADOL HCL 50 MG PO TABS: 50.0000 mg | ORAL_TABLET | Freq: Four times a day (QID) | ORAL | Status: DC | PRN

## 2015-06-01 NOTE — Progress Notes (Signed)
Rn received order to "remove staples from abdominal midline and bilateral groins and place dry gauze on groins afterward" at 0933.  RN removed staples from abdominal wound and attached steri strips to the bottom of the wound.  RN then proceeded to withdraw staples from R groin.  After two staples removed, wound started to become un approximated.  RN immediately stopped removing staples and called for [employer], Consulting civil engineer. This RN paged Doreatha Massed, PA.    Rhyne came to room approximately 1000 and assess wound.  Rhyne called MD Imogene Burn who stated to continue to remove staples and that he would reassess this afternoon when he would be out of surgery.    1130 PA Rhyne has removed staples from both groin sites and applied a WTD dressing to R and L groin sites.  Patient was instructed not to lay flat for 4hrs due to removal of TDC.  RN was instructed to wait for discharge until [facility] or dressing changes could be arranged for home.  RN will continue to monitor.

## 2015-06-01 NOTE — Care Management Note (Addendum)
Case Management Note  Patient Details  Name: NADIR VASQUES MRN: [id number] Date of Birth: 1950-11-02  Subjective/Objective:                  s/p ABF complicated by ARF sec. ARB/hypotension   Action/Plan: Cm spoke to the patient at the bedside who is up walking around. Patient agreeable to [facility] for daily dressing changes. CM offered patient choice and he chose [facility]. CM called Tiffany with [facility] to advise and referral accepted. Patient said that he is able to afford medications and has no home DME needs. CM remains available should additional needs arise.   Expected Discharge Date:                  Expected Discharge Plan:  Home w Home Health Services  In-House Referral:     Discharge planning Services  CM Consult  Post Acute Care Choice:  Home Health Choice offered to:  Patient  DME Arranged:    DME Agency:     HH Arranged:  RN HH Agency:  [facility]  Status of Service:  Completed, signed off  Medicare Important Message Given:    Date Medicare IM Given:    Medicare IM give by:    Date Additional Medicare IM Given:    Additional Medicare Important Message give by:     If discussed at Long Length of Stay Meetings, dates discussed:    Additional Comments:  Darcel Smalling, RN 06/01/2015, 3:34 PM

## 2015-06-01 NOTE — Progress Notes (Signed)
Rn removed patients IV, discontinued from telemetry.  No s/s of infection where IV removed.  Patient and RN with wife and daughter at bedside reviewed AVS summary.  Patient given paper prescriptions.  RN escorted patient to discharge.

## 2015-06-01 NOTE — Discharge Summary (Signed)
AAA Discharge Summary    Robert [name] 02-Aug-1950 65 y.o. male  [id number]  Admission Date: 05/21/2015  Discharge Date: 06/01/15  Physician: Fransisco Hertz, MD  Admission Diagnosis: Peripheral vascular disease with bilateral lower extremity claudication I70.213 Acute renal insufficiency N28.9   HPI:   This is a 64 y.o. male who presents for postoperative follow-up from procedure on Date: 03/13/15: L brachial cannulation, Ao, BRo . 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 The patient was sent to cardiology for risk stratification and has been found to be low risk.  Hospital Course:  The patient was admitted to the hospital and taken to the operating room on 05/21/2015 and underwent:  Aortobifemoral bypass (Dacron 14 mm x 8 mm)  Bilateral iliofemoral endarterectomy  Left iliofemoral bovine patch angioplasty  Umbilical hernia repair    The pt tolerated the procedure well and was transported to the PACU in good condition.   By POD 1, the NGT was kept to keep eye on abd wall given the attenuated fascia in this patient found intraop.  He did have a bump in his creatinine, but continued to have UOP > 30cc/hr and clear.  It was thought at the time the bump in Cr likely related to intraop hypotension. As there was no thrombus at the aortic neck, I doubt crossclamp resulted in embolization.  By POD 2, his UOP had decreased.  On exam, +voluntary guarding without rebound tenderness. Exam non-diagnostic. Will recheck CBC and BMP. May need to obtain CT abd/pelvis vs sigmoidoscopy if concerns of bowel ischemia arise.  He did have a CT scan and Dr. Imogene Burn reviewed the CT abd/pelvis. The patient has progression of contrast into the small bowel consistent with GI motility. There is a significant amount of gas and stool but no big hematoma. There is no evidence of hydronephrosis. All findings on the CT are consistent expected post-operative  findings.    A nephrology consult was obtained.  He did have a TDC placed on 05/23/15.  And he had a dialysis session that afternoon.  He did have acute blood loss anemia with hgb of 8.2.  His NGT remained in place since his abdomen was a little distended with hypoactive BS.  His hyperkalemia was resolved.  It is thought his ARF on CKD 3 (baseline Scr 1.5) d/t ATN from hypotension in face of ARB.  He did have an other HD session on 05/24/15.  By POD 4, is acute blood loss anemia was improving.  His renal artery duplex shows that the renal arteries are patent.  He was transferred to 3 south.   He had also started making some urine also. He also had another HD treatment.  By POD 6, HD was held to see what his renal function would do given the improvement in his UOP (which worsens with dialysis fluid removal).  IV diuretics were d/c'd and po started.  ARB/HCT was also discontinued.  By POD 5, his NGT was removed and his diet was started slowly.  He was transferred to 2 west.    By POD 7, he did have some diarrhea, but this was expected given the po contrast load given by radiology.  His C diff was negative.  His foley catheter was removed given he was making good urine.  Lasix and K were held.  Postoperative ABI's on 05/29/15:  RIGHT    LEFT    PRESSURE WAVEFORM  PRESSURE WAVEFORM  BRACHIAL 146 Normal BRACHIAL  142 Normal  DP 100 triphasic DP 120 Triphasic   PT 108 biphasic PT 113 Triphasic  PER   PER    GREAT TOE  NA GREAT TOE  NA    RIGHT LEFT  ABI 0.74 0.82       By POD 9, he had marked improvement in his UOP overnight.  His creatinine was improved but still elevated.  Incision healing nicely with staples in tact.  By POD 10, his creatinine had improved to 3.02, which was significantly improved.  Discussed with Dr. Lowell Guitar and will discharge pt today and follow up labs on Tuesday 06/03/15 with PCP and weekly thereafter to make sure his kidneys  continue to recover.   Staples removed before discharge and groin wound care discussed in length with pt.  The remainder of the hospital course consisted of increasing mobilization and increasing intake of solids without difficulty.  CBC    Component Value Date/Time   WBC 10.4 05/26/2015 0800   RBC 2.95* 05/26/2015 0800   HGB 8.8* 05/26/2015 0800   HCT 26.9* 05/26/2015 0800   PLT 225 05/26/2015 0800   MCV 91.2 05/26/2015 0800   MCH 29.8 05/26/2015 0800   MCHC 32.7 05/26/2015 0800   RDW 14.1 05/26/2015 0800    BMET    Component Value Date/Time   NA 135 06/01/2015 0224   K 4.2 06/01/2015 0224   CL 101 06/01/2015 0224   CO2 23 06/01/2015 0224   GLUCOSE 118* 06/01/2015 0224   BUN 85* 06/01/2015 0224   CREATININE 3.02* 06/01/2015 0224   CALCIUM 8.8* 06/01/2015 0224   GFRNONAA 21* 06/01/2015 0224   GFRAA 24* 06/01/2015 0224     Discharge Instructions    ABDOMINAL PROCEDURE/ANEURYSM REPAIR/AORTO-BIFEMORAL BYPASS:  Call MD for increased abdominal pain; cramping diarrhea; nausea/vomiting    Complete by:  As directed      Call MD for:  redness, tenderness, or signs of infection (pain, swelling, bleeding, redness, odor or green/yellow discharge around incision site)    Complete by:  As directed      Call MD for:  severe or increased pain, loss or decreased feeling  in affected limb(s)    Complete by:  As directed      Call MD for:  temperature >100.5    Complete by:  As directed      Discharge wound care:    Complete by:  As directed   Wash the groin wound with soap and water daily and pat dry. (No tub bath-only shower)  Then put a dry gauze or washcloth there to keep this area dry daily and as needed.  Do not use Vaseline or neosporin on your incisions.  Only use soap and water on your incisions and then protect and keep dry.     Driving Restrictions    Complete by:  As directed   No driving for 2 weeks     Lifting restrictions    Complete by:  As directed   No lifting for 4  weeks     Resume previous diet    Complete by:  As directed            Discharge Diagnosis:  Peripheral vascular disease with bilateral lower extremity claudication I70.213 Acute renal insufficiency N28.9  Secondary Diagnosis: Patient Active Problem List   Diagnosis Date Noted  . Aortic stenosis 05/21/2015  . 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 Atherosclerosis of native artery of right  lower extremity with intermittent claudication (HCC) 04/18/2015  . 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 Hypertension 04/10/2015  . Hyperlipidemia 04/10/2015  . Hypertriglyceridemia 04/10/2015  . CKD (chronic kidney disease) stage 3, GFR 30-59 ml/min 04/10/2015  . Glaucoma 04/10/2015  . Atherosclerosis of native arteries of extremity with intermittent claudication (HCC) 11/01/2014   Past Medical History  Diagnosis Date  . Hyperlipidemia     takes Tricor and Vytorin daily  . Chronic kidney disease     Stage III  Moderate  . Glaucoma     uses eye drops nightly  . Hypertension     takes Diovan HCT daily  . Hypertension 04/10/2015  . Peripheral vascular disease (HCC)   . History of bronchitis as a child   . Chronic back pain     ruptured disc  . Nocturia   . Cataracts, bilateral     immature       Medication List    STOP taking these medications        valsartan-hydrochlorothiazide 320-25 MG tablet  Commonly known as:  DIOVAN-HCT      TAKE these medications        aspirin 81 MG tablet  Take 81 mg by mouth daily.     ciclopirox 0.77 % cream  Commonly known as:  LOPROX  Apply 1 application topically 2 (two) times daily as needed. Gel     Ciclopirox 0.77 % gel     diphenhydrAMINE 25 mg capsule  Commonly known as:  BENADRYL  Take 25 mg by mouth at bedtime.     ezetimibe-simvastatin 10-40 MG tablet  Commonly known as:  VYTORIN  Take 1 tablet by mouth daily.     fenofibrate 145 MG tablet    Commonly known as:  TRICOR  Take 145 mg by mouth daily.     traMADol 50 MG tablet  Commonly known as:  ULTRAM  Take 1 tablet (50 mg total) by mouth every 6 (six) hours as needed for moderate pain. For PAIN   PRN     TRAVATAN Z 0.004 % Soln ophthalmic solution  Generic drug:  Travoprost (BAK Free)  Place 1 drop into both eyes at bedtime.     travoprost (benzalkonium) 0.004 % ophthalmic solution  Commonly known as:  TRAVATAN  Place 1 drop into both eyes at bedtime. Reported on 04/18/2015        Prescriptions given: 1.  Tramadol #30 No Refill  Instructions: 1.  Wash the groin wound with soap and water daily and pat dry. (No tub bath-only shower)  Then put a dry gauze or washcloth there to keep this area dry daily and as needed.  Do not use Vaseline or neosporin on your incisions.  Only use soap and water on your incisions and then protect and keep dry.  Disposition: home  Patient's condition: is Good  Follow up: 1. Dr. Imogene Burn in 2 weeks 2. Dr. Tiburcio Pea for BMP on 06/03/15 to check renal function and weekly thereafter.   Doreatha Massed, PA-C [facility] [phone] 06/01/2015  9:33 AM   Addendum  I have independently interviewed and examined the patient, and I agree with the physician assistant's discharge summary.  This patient underwent an aortobifemoral bypass, bilateral iliofemoral endarterectomy, and left femoral bovine patch angioplasty for life-style limiting intermittent claudication.  He underwent maximal medical management for 6 months but he felt his short distance intermittent claudication was limiting his ADLs.  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  Subsequently, I offered him aortobifemoral bypass.  He underwent that procedure on 05/21/15.  His intraoperative findings are consistent with severe calcific atherosclerosis throughout the iliofemoral arterial  segments extending into the bilateral superficial femoral arteries.  The rest of his procedure was unremarkable.  His aorta was relatively pristine without thrombus and the anastomosis was completed 2-3 cm distal to the renal arteries.  However, post-operatively his renal function deteriorated over POD #1.  Aggressive fluid resuscitation and transfusion was completed to test if a prerenal etiology was responsible.  Additionally testing including CT abd/pelvis to look for possible bowel ischemia or bleeding was completed but this did not turn out to be diagnostic.  Renal ultrasound was normal as was renal arterial duplex.  Nephrology saw the patient on POD #2 and felt that the patient likely had acute renal failure related to ARB use and hypotension.   A tunneled dialysis catheter was placed to facilitate temporary hemodialysis on POD #2.  He required a few session of hemodialysis.  This helped his kidney function recover and slowly over the next few days with the use of Lasix his urine output improved.  Eventually lasix was discontinued and the patient continued to have increased urine output without diuresis.  On POD #11, his urine output was >2.4 L overnight with decreasing to Cr 3.0.  Nephrology felt the patient was stable for continued monitoring as an outpatient.  He also had staples removed today.  The patient developed right groin superficial separation.  There was small area of separation in the superior pole of the left groin incision.  The patient was started on wet-to-dry dressing and wound care was setup for outpatient.  The groin separation is not surprise given this patient's obesity.  I gave the patient strict instruction in regards to wound care of both groins.  I will arrange to have the patient's wet-to-dry dressing completed in my office tomorrow until I have verified wound care is arranged for the patient at home.  He will follow up in the office weekly to check on the groins  incisions.   Leonides Sake, MD [facility] [place] Office: [phone] Pager: [phone]  06/01/2015, 12:52 PM     - For VQI Registry use ---   Post-op:  Time to Extubation:  In OR,  < 12 hrs,  12-24 hrs,  >=24 hrs Vasopressors Req. Post-op: No ICU Stay: 3 day in ICU Transfusion: Yes  If yes, 2 units given MI: No,  Troponin only,  EKG or Clinical New Arrhythmia: No  Complications: CHF: No Resp failure: No,  Pneumonia,  Ventilator Chg in renal function: Yes, [x ] Inc. Cr > 0.5, [x ] Temp. Dialysis,  Permanent dialysis Leg ischemia: No, no Surgery needed,  Yes, Surgery needed,  Amputation Bowel ischemia: No,  Medical Rx,  Surgical Rx Wound complication: No,  Superficial separation/infection,  Return to OR Return to OR: No  Return to OR for bleeding: No Stroke: No,  Minor,  Major  Discharge medications: Statin use:  Yes If No:  For Medical reasons,  Non-compliant ASA use:  Yes  If No:  For Medical reasons,  Non-compliant Plavix use:  No If No:  For Medical reasons,  Non-compliant Beta blocker use:  No If No:  For Medical reasons,  Non-compliant  ACEI use:  No ARB use:  No (discontinued in hospital due to renal function)

## 2015-06-01 NOTE — Progress Notes (Signed)
   Daily Progress Note  Assessment/Planning: POD #11 s/p ABF complicated by ARF sec. ARB/hypotension    Renal ok with d/c with PCP follow up  D/C staples today  D/C TDC   Follow up in office in 2 weeks  Subjective  - 9 Days Post-Op  No complaints, peeing more  Objective Filed Vitals:   05/31/15 0558 05/31/15 1458 05/31/15 2037 06/01/15 0505  BP: 152/45 142/54 141/43 139/60  Pulse: 85 87 85 88  Temp: 97.5 F (36.4 C) 97.5 F (36.4 C) 98.1 F (36.7 C) 98.7 F (37.1 C)  TempSrc: Oral Oral Oral Oral  Resp: Height:      Weight: 198 lb 11.2 oz (90.13 kg)   199 lb 9.6 oz (90.538 kg)  SpO2: 100% 100% 100% 99%    Intake/Output Summary (Last 24 hours) at 06/01/15 1004 Last data filed at 06/01/15 0900  Gross per 24 hour  Intake    240 ml  Output   2450 ml  Net  -2210 ml    PULM  CTAB CV  RRR GI  soft, NTND VASC  All incisions c/d/i, palpable pedal pulses  Laboratory CBC    Component Value Date/Time   WBC 10.4 05/26/2015 0800   HGB 8.8* 05/26/2015 0800   HCT 26.9* 05/26/2015 0800   PLT 225 05/26/2015 0800    BMET    Component Value Date/Time   NA 135 06/01/2015 0224   K 4.2 06/01/2015 0224   CL 101 06/01/2015 0224   CO2 23 06/01/2015 0224   GLUCOSE 118* 06/01/2015 0224   BUN 85* 06/01/2015 0224   CREATININE 3.02* 06/01/2015 0224   CALCIUM 8.8* 06/01/2015 0224   GFRNONAA 21* 06/01/2015 0224   GFRAA 24* 06/01/2015 0224    Leonides Sake, MD [facility] [place] Office: [phone] Pager: [phone]  06/01/2015, 10:04 AM

## 2015-06-01 NOTE — Progress Notes (Signed)
Staples removed from midline and bilateral groins.  Right groin with separation of wound edges and left groin with mild separation of proximal wound.  Wet to dry dressing placed until evaluated by Dr. Imogene Burn.  He most likely will need wet to dry dressing changes.  I have talked to Tobi Bastos with CM and she will start working on setting up [employer].  Haskell Riling, [facility] 06/01/2015 11:44 AM

## 2015-06-01 NOTE — Progress Notes (Signed)
Assessment: 1. ARF on CKD 3 (baseline Scr 1.5) due to ATN from hypotension in face of ARB, recovery phase & improving 2. SP aortobifem 3. Hx HTN Plan: Monitor renal fct off diuretics and ARB F/u with his PCP with labs Tuesday and then weekly or PRN until back to baseline .Julieanne Hadsall C

## 2015-06-01 NOTE — Progress Notes (Signed)
  Catheter Removal Procedure Note    Diagnosis: ESRD  Plan:  Remove left diatek catheter  Consent signed:  Yes.   Time out completed:  Yes.   Coumadin:  No. PT/INR (if applicable):   Other labs:  Procedure: 1.  Sterile prepping and draping over catheter area 2. 0 ml 2% lidocaine plain instilled at removal site. 3.  left catheter removed in its entirety with cuff in tact. 4.  Complications:  None 5. Tip of catheter sent for culture:  No.   Patient tolerated procedure well:  Yes.   Pressure held, no bleeding noted, dressing applied Instructions given to the pt regarding wound care and bleeding.   Doreatha Massed, PA-C 06/01/2015 11:44 AM

## 2015-06-02 ENCOUNTER — Ambulatory Visit: Payer: 59 | Admitting: *Deleted

## 2015-06-02 ENCOUNTER — Telehealth: Payer: Self-pay | Admitting: *Deleted

## 2015-06-02 ENCOUNTER — Encounter: Payer: Self-pay | Admitting: *Deleted

## 2015-06-02 ENCOUNTER — Encounter: Payer: Self-pay | Admitting: Vascular Surgery

## 2015-06-02 VITALS — BP 125/55 | HR 98 | Temp 98.5°F | Resp 18 | Ht 66.0 in | Wt 200.0 lb

## 2015-06-02 DIAGNOSIS — T8131XD Disruption of external operation (surgical) wound, not elsewhere classified, subsequent encounter: Secondary | ICD-10-CM

## 2015-06-02 NOTE — Telephone Encounter (Signed)
Patient is post op aortobifemoral BP on 05-21-15 by Dr. Imogene Burn. When he discharged yesterday and the floor nurse took his staples out, both of his groin wounds dehisced. Dr. Imogene Burn ordered daily wound dressing changes to be done by [facility] nurse.   I spoke to Robert [name], [facility] nurse supervisor at [facility] office of [facility], and she did have the referral called in from yesterday by case Manager, Robert Gates, RN, for daily dressing changes. Robert [name] said that they were going to start this care tomorrow because of the short notice, they didn't have any nurse available today.  Per Dr. Nicky Pugh order, I will be doing the wound dressing changes today.  Per Robert [name], there is no family or friend able to do these dressing changes in rotation with the [facility] nurse. He knows the importance of doing this daily and will make arrangements to come to our office if he has to get this done in rotation with the [facility] nurse going to see him 3 times a week.

## 2015-06-02 NOTE — Progress Notes (Signed)
Patient came in today for dressing changes to both groin wounds as per Dr. Nicky Pugh verbal order. He is post Aortobifemoral BPG and bilateral femoral endarterectomies on 05-21-15. Patient had ARF and had temporary HD cath while in hospital. His abdomen and groin Incisions were clean and dry with 1.5 cm erythema on each side of each  incision. Patient's VS are stable and he reports a pain level of 4 (aching type pain); no fever or chills noted at home.  He has some crusting at the umbilicus (pt also had umbilical herniorrhaphy). No graft is exposed, just fat layer observed in wound bed. I cleaned both of his groin wounds (slight dehiscence mainly on right groin incision) and redressed them using sterile 2x2 lightly packed in wound and covered with sterile 4X4's. His HH nurse will be doing dressing change tomorrow and will report to me whether or not they can do daily home visits. If [facility] nurse can only go out 3 times per week, the patient will come back to our office the alternate days. Patient and wife understand the importance of keeping his wounds clean and dry.

## 2015-06-04 ENCOUNTER — Telehealth: Payer: Self-pay | Admitting: Vascular Surgery

## 2015-06-04 ENCOUNTER — Encounter: Payer: Self-pay | Admitting: Vascular Surgery

## 2015-06-04 NOTE — Telephone Encounter (Signed)
-----   Message from Sharee Pimple, RN sent at 06/02/2015  9:50 AM EST ----- Regarding: schedule appt I am doing the Home Health   ----- Message -----    From: Fransisco Hertz, MD    Sent: 06/01/2015   1:06 PM      To: [facility] [address]  Robert [name] [id number] 11/25/1950   Pt needs: 1.  Wet-to-dry dressing to both groin incisions Monday (tomorrow) with hydrogel. 2.  Confirm his wound care is setup for home 3.  Schedule him for week wound check in office on Friday

## 2015-06-04 NOTE — Telephone Encounter (Signed)
Spoke with patient, dpm °

## 2015-06-06 ENCOUNTER — Ambulatory Visit (INDEPENDENT_AMBULATORY_CARE_PROVIDER_SITE_OTHER): Payer: 59 | Admitting: Vascular Surgery

## 2015-06-06 ENCOUNTER — Encounter: Payer: Self-pay | Admitting: Vascular Surgery

## 2015-06-06 VITALS — BP 144/71 | HR 92 | Temp 97.9°F | Ht 66.0 in | Wt 204.6 lb

## 2015-06-06 DIAGNOSIS — Z95828 Presence of other vascular implants and grafts: Secondary | ICD-10-CM

## 2015-06-06 DIAGNOSIS — I70212 Atherosclerosis of native arteries of extremities with intermittent claudication, left leg: Secondary | ICD-10-CM

## 2015-06-06 MED ORDER — CEPHALEXIN 500 MG PO CAPS: 500.0000 mg | ORAL_CAPSULE | Freq: Two times a day (BID) | ORAL | Status: DC

## 2015-06-06 MED ORDER — COLLAGENASE 250 UNIT/GM EX OINT: 1.0000 "application " | TOPICAL_OINTMENT | Freq: Every day | CUTANEOUS | Status: DC

## 2015-06-06 NOTE — Progress Notes (Signed)
POST OPERATIVE OFFICE NOTE    CC:  F/u for surgery  HPI:  This is a 65 y.o. male who is s/p Aortobifemoral bypass grafting with bilateral iliofemoral endarterectomy who developed ATN from hypotension in face of ARB.  He did receive 2-3 HD sessions in the hospital.  His renal function is recovering and his creatinine this past Tuesday was 1.7.  He states that the right groin has had more drainage than the left.  It has not been purulent.  They have been doing daily wet to dry dressing changes.  He states his PCP is wanting him to keep a log of his BP to determine medication.  He also complains of swelling in his scrotal area.   He states he is eating well and having regular bowel movements.  He is voiding without difficulty.   No Known Allergies  Current Outpatient Prescriptions  Medication Sig Dispense Refill  . aspirin 81 MG tablet Take 81 mg by mouth daily.    . ciclopirox (LOPROX) 0.77 % cream Apply 1 application topically 2 (two) times daily as needed. Gel    . Ciclopirox 0.77 % gel     . diphenhydrAMINE (BENADRYL) 25 mg capsule Take 25 mg by mouth at bedtime.    Marland Kitchen ezetimibe-simvastatin (VYTORIN) 10-40 MG per tablet Take 1 tablet by mouth daily.    . fenofibrate (TRICOR) 145 MG tablet Take 145 mg by mouth daily.    . traMADol (ULTRAM) 50 MG tablet Take 1 tablet (50 mg total) by mouth every 6 (six) hours as needed for moderate pain. For PAIN   PRN 30 tablet 0  . Travoprost, BAK Free, (TRAVATAN Z) 0.004 % SOLN ophthalmic solution Place 1 drop into both eyes at bedtime.     . travoprost, benzalkonium, (TRAVATAN) 0.004 % ophthalmic solution Place 1 drop into both eyes at bedtime. Reported on 04/18/2015     No current facility-administered medications for this visit.     ROS:  See HPI  Physical Exam:  Vital signs: Filed Vitals:   06/06/15 1403  BP: 149/78  Pulse: 92  Temp: 97.9 F (36.6 C)  Weight:  204.6 lbs   Incision:  right groin with superficial separation of incision with  fibrinous exudate; left groin with mild superficial separation of proximal portion of incision  Abdomen:  Soft, NT/ND; +BM  Assessment/Plan:  This is a 65 y.o. male who is s/p:  Aortobifemoral bypass (Dacron 14 mm x 8 mm)  Bilateral iliofemoral endarterectomy  Left iliofemoral bovine patch angioplasty  Umbilical hernia repair And Insertion of TDC   -pt's creatinine continues to improve and was 1.7 when it was checked on Tuesday. -he is eating well and having regular BM's  -his wounds look good and still remain superficial with fibrinous exudate.  Will begin with Santyl twice daily with wet to dry dressing.   -given that he has drainage from the right groin, which is most likely seroma, will give Keflex since he does have synthetic material in the groin.  -His creatinine was 1.7 on Tuesday and his Keflex has been adjusted appropriately by Dr. Imogene Burn after GFR and CrCl was calculated. -scrotal edema-suspect this will improve as his kidneys recover and he is able to get rid of more fluid.   -continue to log blood pressure  -f/u in one week for wound checks. -if his creatinine increases at his next check next week, he will let us know and we can readjust his Keflex.   Doreatha Massed, PA-C [facility] [phone]  Clinic MD:  Pt seen and examined with Dr. Imogene Burn   Addendum  I have independently interviewed and examined the patient, and I agree with the physician assistant's findings.  Pt's kidney are recovering with good UOP and Cr down to 1.7 most recently.  Both groin have superficial dehiscence with desiccated fat, no surprise given his obesity.  I recommend: Santyl ointment B groin BID with wet-to-dry dressing BID on top.  He will continue to follow up weekly for wound checks.  As he notes some serous drainage from the right groin, I recommended: Keflex 500 mg PO BID x 10 days(renal adjusted for GFR <50).  This is a precaution against seeding for a seroma in the  right groin.  Leonides Sake, MD [facility] [place] Office: [phone] Pager: [phone]  06/06/2015, 7:13 PM

## 2015-06-08 ENCOUNTER — Encounter: Payer: Self-pay | Admitting: Vascular Surgery

## 2015-06-12 ENCOUNTER — Encounter: Payer: Self-pay | Admitting: Vascular Surgery

## 2015-06-13 ENCOUNTER — Ambulatory Visit (INDEPENDENT_AMBULATORY_CARE_PROVIDER_SITE_OTHER): Payer: 59 | Admitting: Vascular Surgery

## 2015-06-13 ENCOUNTER — Encounter: Payer: Self-pay | Admitting: Vascular Surgery

## 2015-06-13 VITALS — BP 148/72 | HR 88 | Temp 99.0°F | Resp 18 | Ht 66.0 in | Wt 200.0 lb

## 2015-06-13 DIAGNOSIS — I745 Embolism and thrombosis of iliac artery: Secondary | ICD-10-CM

## 2015-06-13 DIAGNOSIS — I70212 Atherosclerosis of native arteries of extremities with intermittent claudication, left leg: Secondary | ICD-10-CM

## 2015-06-13 DIAGNOSIS — T8131XD Disruption of external operation (surgical) wound, not elsewhere classified, subsequent encounter: Secondary | ICD-10-CM

## 2015-06-13 MED ORDER — CEPHALEXIN 500 MG PO CAPS: 500.0000 mg | ORAL_CAPSULE | Freq: Two times a day (BID) | ORAL | Status: DC

## 2015-06-13 NOTE — Progress Notes (Addendum)
    Post-operative Open AAA Repair  History of Present Illness  Robert [name] is a 65 y.o. male who presents post-op s/p ABF complicated by ARF sec. ARB/hypotension, UHR, L iliofem BPA (Date: 05/21/15).  The patient has had no back or abdominal pain.  The patient has had increased serous drainage from right groin, now requiring TID dressing changes.  The left groin appears to be stable.   For VQI Use Only  PRE-ADM LIVING: Home  AMB STATUS: Ambulatory   Physical Examination  Filed Vitals:   06/13/15 1552  BP: 148/72  Pulse: 88  Temp: 99 F (37.2 C)  TempSrc: Oral  Resp: 18  Height: 5\' 6"  (1.676 m)  Weight: 200 lb (90.719 kg)  SpO2: 99%    Gastrointestinal: soft, NTND, -G/R, - HSM, - masses, - CVAT B, inc c/d/i, staples out  B groins: R with increasing separation and viable fat, L with small 1 cm ulcer with viable fat  VASC: feet warm with faintly palpable pulses in feet   Medical Decision Making  Robert [name] is a 65 y.o. male who presents s/p ABF complicated by ARF sec. ARB/hypotension, UHR, L iliofem BPA    Will add VAC dsg to right groin x3/week.  Continue current wound care regimen in L groin.  Follow in one week for wound check.  Continue Keflex while skin open.  Thank you for allowing us to participate in this patient's care.  Leonides SakeBrian Lorrin Nawrot, MD [facility] [place] Office: [phone] Pager: [phone]  06/13/2015, 4:21 PM

## 2015-06-13 NOTE — Addendum Note (Signed)
Addended by: Sharee PimpleMCCHESNEY, MARILYN K on: 06/13/2015 05:04 PM   Modules accepted: Orders

## 2015-06-16 ENCOUNTER — Encounter: Payer: Self-pay | Admitting: Vascular Surgery

## 2015-06-19 ENCOUNTER — Encounter: Payer: Self-pay | Admitting: Vascular Surgery

## 2015-06-19 ENCOUNTER — Ambulatory Visit (INDEPENDENT_AMBULATORY_CARE_PROVIDER_SITE_OTHER): Payer: Self-pay | Admitting: Vascular Surgery

## 2015-06-19 VITALS — BP 146/75 | HR 97 | Temp 98.6°F | Resp 18 | Ht 66.0 in | Wt 197.2 lb

## 2015-06-19 DIAGNOSIS — I70212 Atherosclerosis of native arteries of extremities with intermittent claudication, left leg: Secondary | ICD-10-CM

## 2015-06-19 NOTE — Progress Notes (Signed)
    Post-operative Open AAA Repair  History of Present Illness  Robert [name] is a 65 y.o. (11/11/1950) male  who presents post-op s/p ABF complicated by ARF sec. ARB/hypotension, UHR, L iliofem BPA (Date: 05/21/15). The patient has had no back or abdominal pain. The patient is getting wound VAC to right groin and wet-to-dry dressing to L groin.  The wife thinks left groin incision is contracting.   For VQI Use Only  PRE-ADM LIVING: Home  AMB STATUS: Ambulatory   Physical Examination Filed Vitals:   06/19/15 1455 06/19/15 1458  BP: 153/76 146/75  Pulse: 97   Temp: 98.6 F (37 C)   TempSrc: Oral   Resp: 18   Height: 5\' 6"  (1.676 m)   Weight: 197 lb 3.2 oz (89.449 kg)   SpO2: 96%     Gastrointestinal: soft, NTND, -G/R, - HSM, - masses, - CVAT B, inc c/d/i, staples out  B groins: peripheral development of granulation, viable fat centrally,  L with small 1 cm ulcer with viable fat  VASC: feet warm with faintly palpable pulses in feet   Medical Decision Making  Robert [name] is a 65 y.o. (11/11/1950) male  who presents s/p ABF complicated by ARF sec. ARB/hypotension, UHR, L iliofem BPA    Follow in one week for wound check.  Thank you for allowing us to participate in this patient's care.  Leonides SakeBrian Denajah Farias, MD, FACS [facility] [place] Office: [phone] Pager: [phone]  06/19/2015, 3:34 PM

## 2015-06-23 ENCOUNTER — Encounter: Payer: Self-pay | Admitting: Vascular Surgery

## 2015-06-26 NOTE — Progress Notes (Signed)
    Post-operative Open AAA Repair  History of Present Illness  Robert [name] is a 65 y.o. (15-Mar-1951) male who presents post-op s/p ABF complicated by ARF sec. ARB/hypotension, UHR, L iliofem BPA (Date: 05/21/15). The patient has had no back or abdominal pain. Pt continues to do VAC dressing to R groin and L wet-to-dry dressing to L groin.  For VQI Use Only  PRE-ADM LIVING: Home  AMB STATUS: Ambulatory   Physical Examination Filed Vitals:   06/27/15 1541 06/27/15 1543  BP: 155/81 160/81  Pulse: 99   Temp: 98.9 F (37.2 C)   TempSrc: Oral   Height: 5\' 6"  (1.676 m)   Weight: 196 lb (88.905 kg)   SpO2: 97%     R groin: +granulation with some venous bleeding,  L groin: small <1 cm ulcer, clean,   VASC: feet warm with faintly palpable pulses in feet   Medical Decision Making  Robert [name] is a 65 y.o. (15-Mar-1951) male who presents s/p ABF complicated by ARF sec. ARB/hypotension, UHR, L iliofem BPA    Follow in 2 weeks for wound check.  Thank you for allowing us to participate in this patient's care.  Leonides SakeBrian Tasneem Cormier, MD, FACS [facility] [place] Office: [phone] Pager: [phone]  06/26/2015, 3:56 PM

## 2015-06-27 ENCOUNTER — Encounter: Payer: Self-pay | Admitting: Vascular Surgery

## 2015-06-27 ENCOUNTER — Ambulatory Visit (INDEPENDENT_AMBULATORY_CARE_PROVIDER_SITE_OTHER): Payer: Self-pay | Admitting: Vascular Surgery

## 2015-06-27 VITALS — BP 160/81 | HR 99 | Temp 98.9°F | Ht 66.0 in | Wt 196.0 lb

## 2015-06-27 DIAGNOSIS — I70211 Atherosclerosis of native arteries of extremities with intermittent claudication, right leg: Secondary | ICD-10-CM

## 2015-07-07 ENCOUNTER — Encounter: Payer: Self-pay | Admitting: Vascular Surgery

## 2015-07-07 NOTE — Telephone Encounter (Signed)
Yes please continue with this until you are seen with Dr. Imogene Burnhen.

## 2015-07-10 ENCOUNTER — Encounter: Payer: Self-pay | Admitting: Vascular Surgery

## 2015-07-11 ENCOUNTER — Ambulatory Visit (INDEPENDENT_AMBULATORY_CARE_PROVIDER_SITE_OTHER): Payer: 59 | Admitting: Vascular Surgery

## 2015-07-11 ENCOUNTER — Encounter: Payer: Self-pay | Admitting: Vascular Surgery

## 2015-07-11 VITALS — BP 157/77 | HR 105 | Temp 98.2°F | Ht 66.0 in | Wt 196.0 lb

## 2015-07-11 DIAGNOSIS — I70211 Atherosclerosis of native arteries of extremities with intermittent claudication, right leg: Secondary | ICD-10-CM

## 2015-07-11 NOTE — Progress Notes (Signed)
    Post-operative Open AAA Repair  History of Present Illness  Robert [name] is a 65 y.o. (January 03, 1951) male  who presents post-op s/p ABF complicated by ARF sec. ARB/hypotension, UHR, L iliofem BPA (Date: 05/21/15). The patient has had no back or abdominal pain. Pt continues to do VAC dressing to R groin.  L groin has healed.   For VQI Use Only  PRE-ADM LIVING: Home  AMB STATUS: Ambulatory   Physical Examination  Filed Vitals:   07/11/15 1130 07/11/15 1151  BP: 150/73 157/77  Pulse: 105   Temp: 98.2 F (36.8 C)   TempSrc: Oral   Height: 5\' 6"  (1.676 m)   Weight: 196 lb (88.905 kg)   SpO2: 98%    R groin: +granulation throughout, inferior half of wound is contracting  L groin: healed  VASC: feet warm with faintly palpable pulses in feet   Medical Decision Making  Robert [name] is a 65 y.o. (January 03, 1951) male  who presents s/p ABF complicated by ARF sec. ARB/hypotension, UHR, L iliofem BPA    Cont VAC to R groin  Follow in 4 weeks for wound check.  Thank you for allowing us to participate in this patient's care.  Leonides SakeBrian Chen, MD, FACS [facility] [place] Office: [phone] Pager: [phone]  07/11/2015, 1:09 PM

## 2015-07-14 ENCOUNTER — Telehealth: Payer: Self-pay | Admitting: Vascular Surgery

## 2015-07-14 NOTE — Telephone Encounter (Signed)
Patient called to let us know that he cancelled the urology appointment we scheduled for him 08/20/15 at 9am. He refused the appointment.

## 2015-08-01 ENCOUNTER — Telehealth: Payer: Self-pay | Admitting: *Deleted

## 2015-08-01 NOTE — Telephone Encounter (Signed)
Jorge NyLyndsey, nurse with [facility] called stating that wound was so superficial that  the vac should not be reapplied . The size is 2.5 cm long x 1 cm and 0.2 cm deep.  A saline wet to dry dressing will be applied and appointment to see Dr Imogene Burnhen is scheduled 08/08/2015.

## 2015-08-05 ENCOUNTER — Encounter: Payer: Self-pay | Admitting: Vascular Surgery

## 2015-08-08 ENCOUNTER — Encounter: Payer: Self-pay | Admitting: Vascular Surgery

## 2015-08-08 ENCOUNTER — Ambulatory Visit (INDEPENDENT_AMBULATORY_CARE_PROVIDER_SITE_OTHER): Payer: Self-pay | Admitting: Vascular Surgery

## 2015-08-08 VITALS — BP 159/73 | HR 89 | Resp 18 | Ht 66.0 in | Wt 194.0 lb

## 2015-08-08 DIAGNOSIS — I70211 Atherosclerosis of native arteries of extremities with intermittent claudication, right leg: Secondary | ICD-10-CM

## 2015-08-08 NOTE — Progress Notes (Signed)
    Post-operative Open AAA Repair  History of Present Illness  Robert DeutscherRobert [name] is a 65 y.o. ([birth year]) male who presents post-op s/p ABF complicated by ARF sec. ARB/hypotension, UHR, L iliofem BPA (Date: 05/21/15). The patient has had no back or abdominal pain. VAC dressing to R groin were just recently discontinued due to improvement in this groin. L groin has healed.   For VQI Use Only  PRE-ADM LIVING: Home  AMB STATUS: Ambulatory   Physical Examination  Filed Vitals:   08/08/15 0942 08/08/15 0943  BP: 161/76 159/73  Pulse: 88 89  Resp: 18   Height: 5\' 6"  (1.676 m)   Weight: 194 lb (87.998 kg)   SpO2: 96%    R groin: excellent subcutaneous tissue recovery, with >90% contracture of wound, granulation tissue at level of skin  L groin: healed  VASC: feet warm with faintly palpable pulses in feet   Medical Decision Making  Robert [name] is a 65 y.o. ([birth year]) male who presents s/p ABF complicated by ARF sec. ARB/hypotension, UHR, L iliofem BPA    Gave instructions for wet to dry dressing to R groin  Follow in 4 weeks for wound check.  Thank you for allowing us to participate in this patient's care.   Leonides SakeBrian Prentis Langdon, MD, FACS [facility] [place] Office: [phone] Pager: [phone]  08/08/2015, 8:25 AM

## 2015-08-08 NOTE — Progress Notes (Signed)
Filed Vitals:   08/08/15 0942 08/08/15 0943  BP: 161/76 159/73  Pulse: 88 89  Resp: 18   Height: 5\' 6"  (1.676 m)   Weight: 194 lb (87.998 kg)   SpO2: 96%

## 2015-08-26 ENCOUNTER — Encounter: Payer: Self-pay | Admitting: Vascular Surgery

## 2015-09-09 ENCOUNTER — Encounter: Payer: Self-pay | Admitting: Vascular Surgery

## 2015-09-10 NOTE — Progress Notes (Signed)
    Post-operative Open AAA Repair  History of Present Illness  Clide DeutscherRobert W Worrel is a 65 y.o. ([birth year]) male who presents post-op s/p ABF complicated by ARF sec. ARB/hypotension, UHR, L iliofem BPA (Date: 05/21/15). The patient has had no back or abdominal pain. L groin has healed.  R groin has healed.   For VQI Use Only  PRE-ADM LIVING: Home  AMB STATUS: Ambulatory   Physical Examination  Filed Vitals:   09/12/15 0919  BP: 140/70  Pulse: 80  Temp: 97.3 F (36.3 C)  TempSrc: Oral  Resp: 16  Height: 5\' 6"  (1.676 m)  Weight: 191 lb (86.637 kg)  SpO2: 95%    R groin: healed  L groin: healed  VASC: feet warm with faintly palpable pulses in feet   Medical Decision Making  Clide DeutscherRobert W Deliz is a 65 y.o. ([birth year]) male who presents s/p ABF complicated by ARF sec. ARB/hypotension, UHR, L iliofem BPA    Follow up in one year of aortoiliac duplex and BLE ABI  Thank you for allowing us to participate in this patient's care.   Leonides SakeBrian Chen, MD, FACS [facility] [place] Office: [phone] Pager: [phone]  09/10/2015, 8:31 AM

## 2015-09-12 ENCOUNTER — Ambulatory Visit (INDEPENDENT_AMBULATORY_CARE_PROVIDER_SITE_OTHER): Payer: Self-pay | Admitting: Vascular Surgery

## 2015-09-12 ENCOUNTER — Encounter: Payer: Self-pay | Admitting: Vascular Surgery

## 2015-09-12 VITALS — BP 140/70 | HR 80 | Temp 97.3°F | Resp 16 | Ht 66.0 in | Wt 191.0 lb

## 2015-09-12 DIAGNOSIS — I70213 Atherosclerosis of native arteries of extremities with intermittent claudication, bilateral legs: Secondary | ICD-10-CM

## 2015-09-12 NOTE — Addendum Note (Signed)
Addended by: Fransisco HertzHEN, Urie Loughner L on: 09/12/2015 01:53 PM   Modules accepted: Orders

## 2016-01-22 DIAGNOSIS — H2512 Age-related nuclear cataract, left eye: Secondary | ICD-10-CM | POA: Diagnosis not present

## 2016-02-23 IMAGING — CT CT ABD-PELV W/O CM
2 of 5 series · 15 of 46 positions shown, 17 images · non-contrast
Comparison: CT, 06/19/2007.

CLINICAL DATA: IP WHO HAD A RENAL BIOFEM W/ GRAFT YESTERDAY NOW
HAVING SEVERE ABD PAIN RADIATING INTO TESTICLESPT IS IN ACUTE RENAL
FAILURE SO IV CONTRAST WAS NOT GIVEN? AORTAILIAC OBSTRUCTION

EXAM:
CT ABDOMEN AND PELVIS WITHOUT CONTRAST
TECHNIQUE: Multidetector CT imaging of the abdomen and pelvis was performed
following the standard protocol without IV contrast.

[Series 2: a/p w/o 5mm · axial · non-contrast · 0.81mm/px · z∈[+32,+517]mm · 12 of 107 slices shown, 14 images]
[im 5/107  soft-tissue]
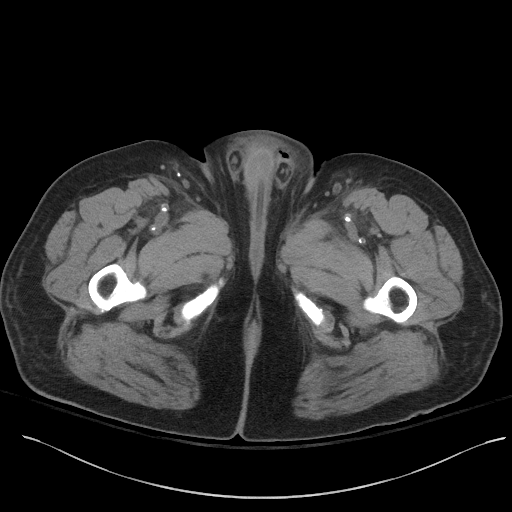
[im 5/107  bone]
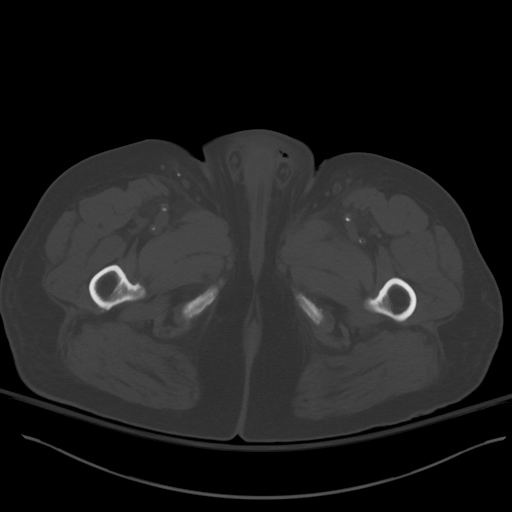
[im 15/107  soft-tissue]
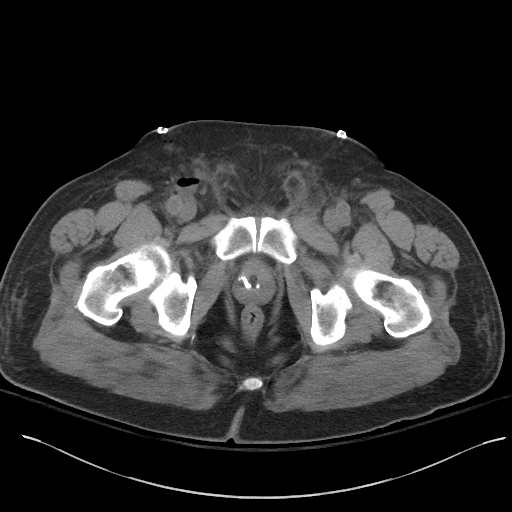
[im 25/107  soft-tissue]
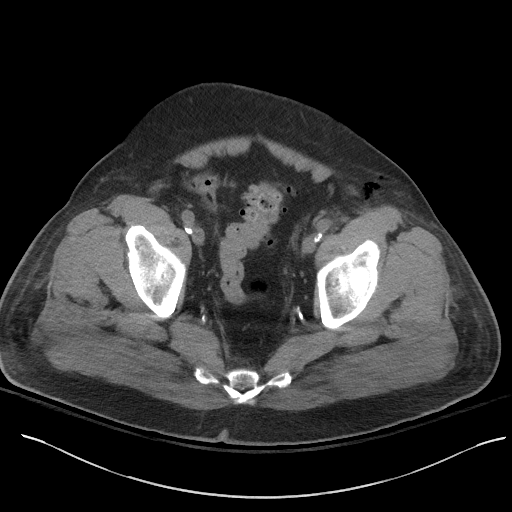
[im 34/107  soft-tissue]
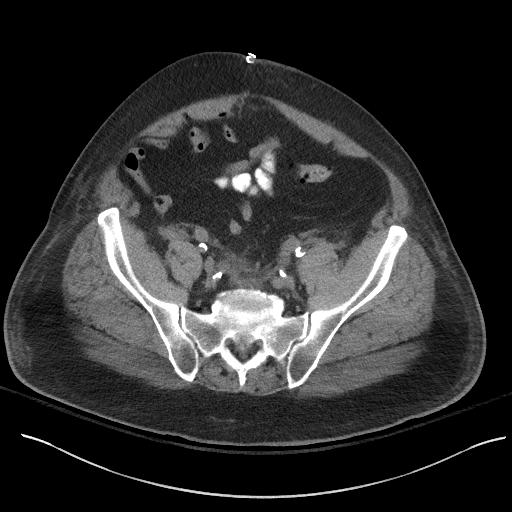
[im 39/107  soft-tissue]
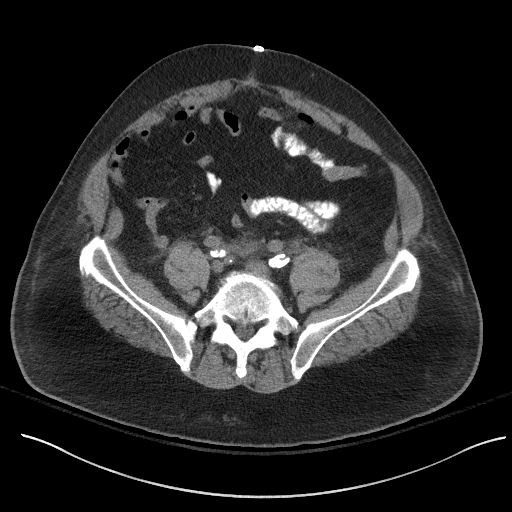
[im 49/107  soft-tissue]
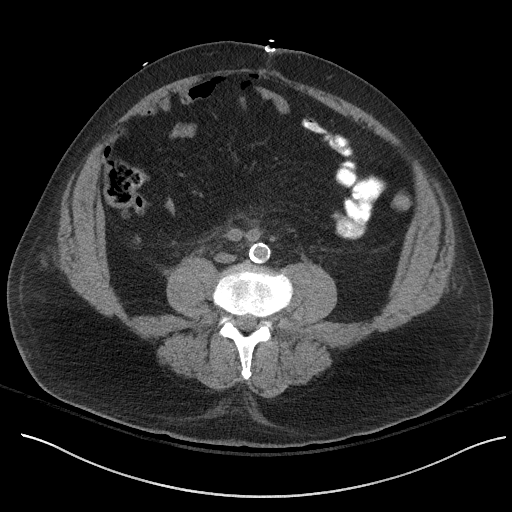
[im 58/107  soft-tissue]
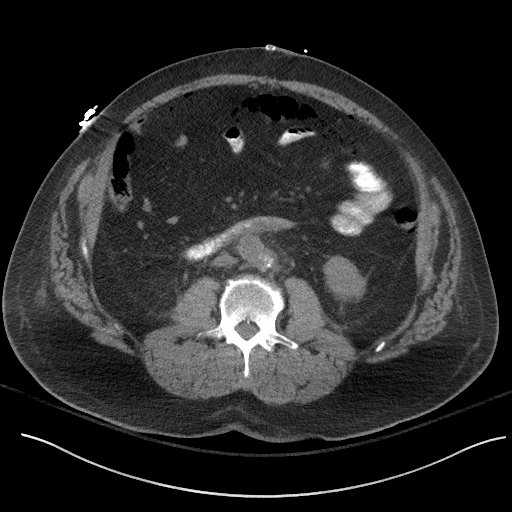
[im 68/107  soft-tissue]
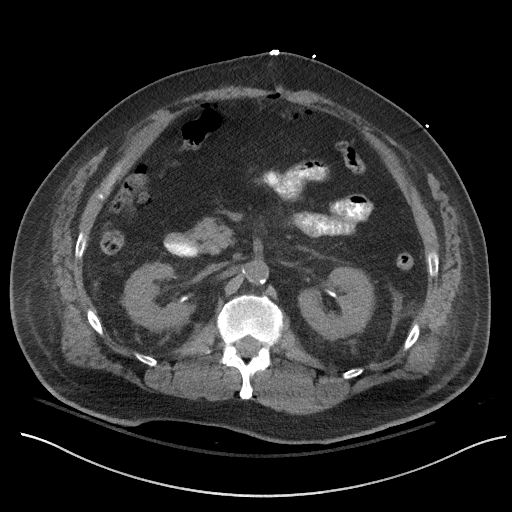
[im 73/107  soft-tissue]
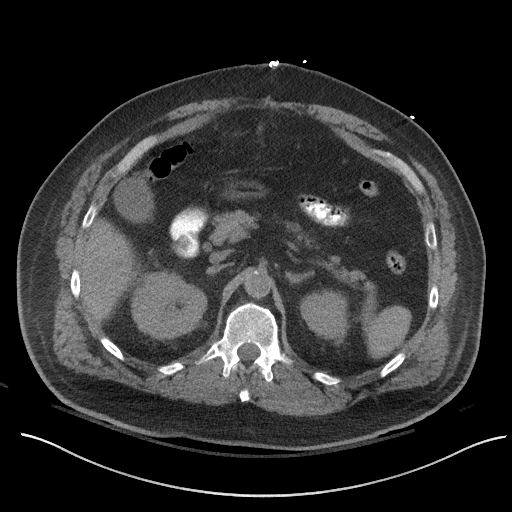
[im 73/107  bone]
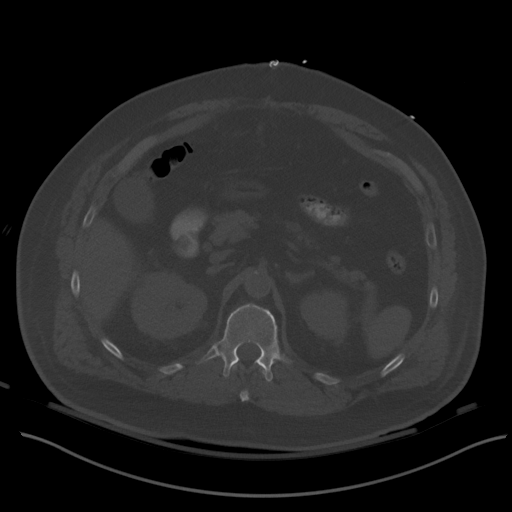
[im 82/107  soft-tissue]
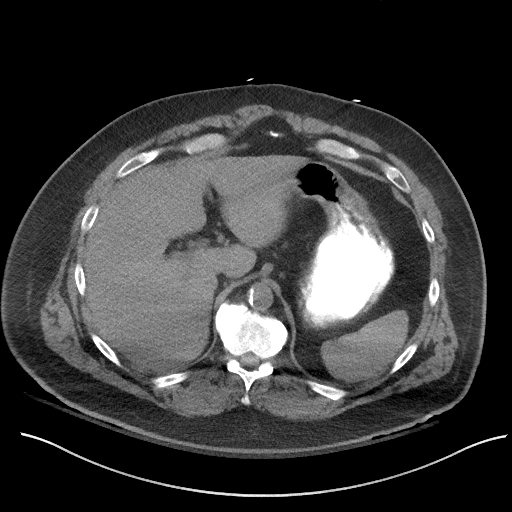
[im 92/107  soft-tissue]
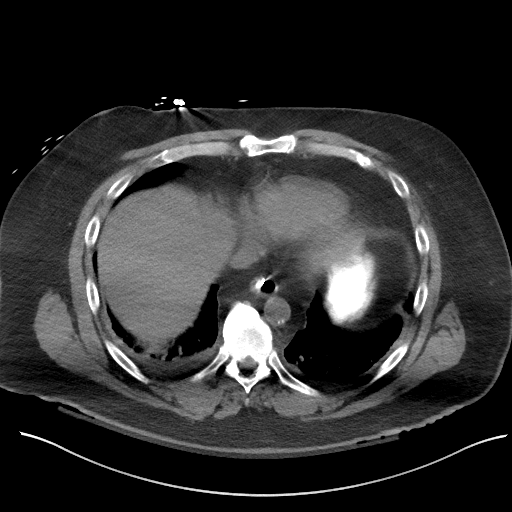
[im 102/107  soft-tissue]
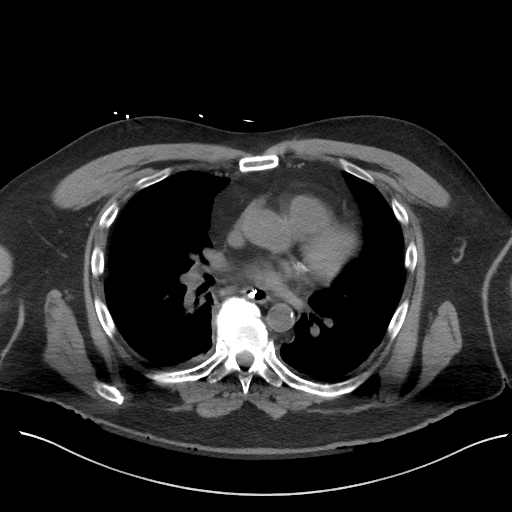

[Series 7: a/p w/o cor · coronal · non-contrast · 0.87mm/px · 3 of 151 slices shown]
[im 51/151  soft-tissue]
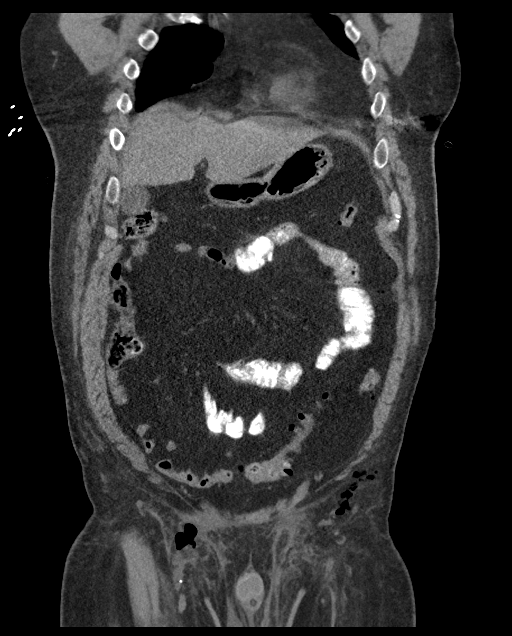
[im 67/151  soft-tissue]
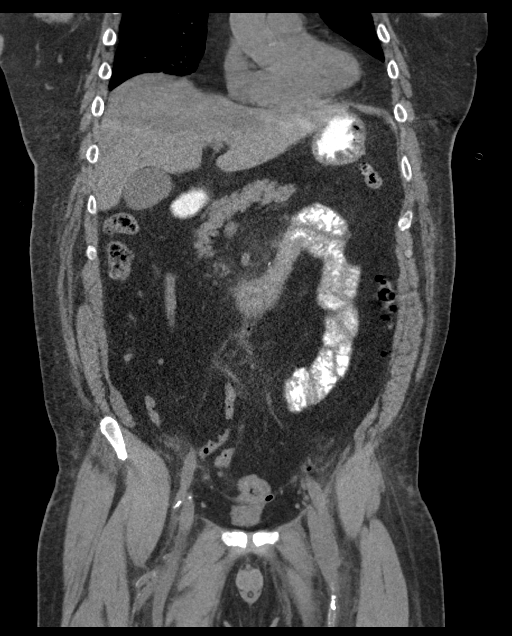
[im 84/151  soft-tissue]
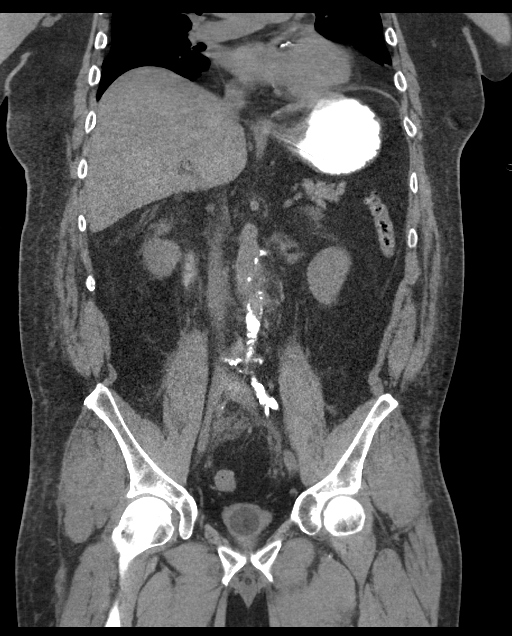

[15 of 46 positions shown; findings below may reference images not displayed]

FINDINGS: Lung bases: Minimal pleural effusions. Mild dependent subsegmental
atelectasis. Heart shows dense coronary artery calcifications.

Hepatobiliary: Mild fatty infiltration of the liver. No mass or
focal lesion. 2 small dependent gallstones. No acute cholecystitis.
No bile duct dilation.

Spleen, pancreas and adrenal glands:  Unremarkable.

Kidneys, ureters, bladder: No renal masses. Bilateral renal vascular
calcifications. No hydronephrosis. Ureters normal course and caliber
with no stones. Bladder is decompressed with a Foley catheter.

Vascular: An aortobifemoral graft has been placed. It extends from
the infrarenal abdominal aorta just below the renal arteries to
anastomose with the common femoral arteries. There is some fluid
attenuation as well as air within the right inguinal soft tissues
extending to the common femoral vessels. There is a smaller amount
of air and inflammatory change in the right inguinal region. Fluid
attenuation and inflammatory type stranding tracks along the
aortoiliac/bifemoral graft, without a focal defined fluid collection
to suggest an abscess. There is no evidence of a hematoma within the
abdomen or pelvis. These findings are all consistent with the
expected day 2 postoperative appearance.

Lymph nodes:  No pathologically enlarged lymph nodes.

Ascites:  None.

Gastrointestinal: No evidence of bowel obstruction, diffuse adynamic
ileus or of bowel inflammation. There are scattered colonic
diverticula without diverticulitis. Normal appendix is visualized.

Peritoneal cavity: There is a small amount of free intraperitoneal
air consistent with postoperative intraperitoneal air.

Abdominal wall: Anterior abdominal wall incision. No hernia or fluid
collection.
IMPRESSION: 1. The patency of the aortoiliac-bifemoral artery graft cannot be
assessed on this unenhanced study.
2. Stranding along the graft as well as some fluid attenuation,
which is more notable in the inguinal regions, right greater than
left, bilateral anterior inguinal soft tissue air, as well as the
small amount of intraperitoneal air, is all consistent with the
expected postsurgical appearance on postop day 2. No evidence of a
postoperative abscess.
3. No evidence of a ureteral stone or obstructive uropathy.
4. Minimal pleural effusions with dependent subsegmental
atelectasis.
5. Small gallstones.  No acute cholecystitis.

## 2016-02-25 IMAGING — CR DG ABD PORTABLE 1V
1 series · 1 of 1 positions shown · non-contrast
Comparison: CT of the abdomen and pelvis performed 05/22/2015

CLINICAL DATA: Nasogastric tube placement.  Initial encounter.

EXAM:
PORTABLE ABDOMEN - 1 VIEW

[AP]
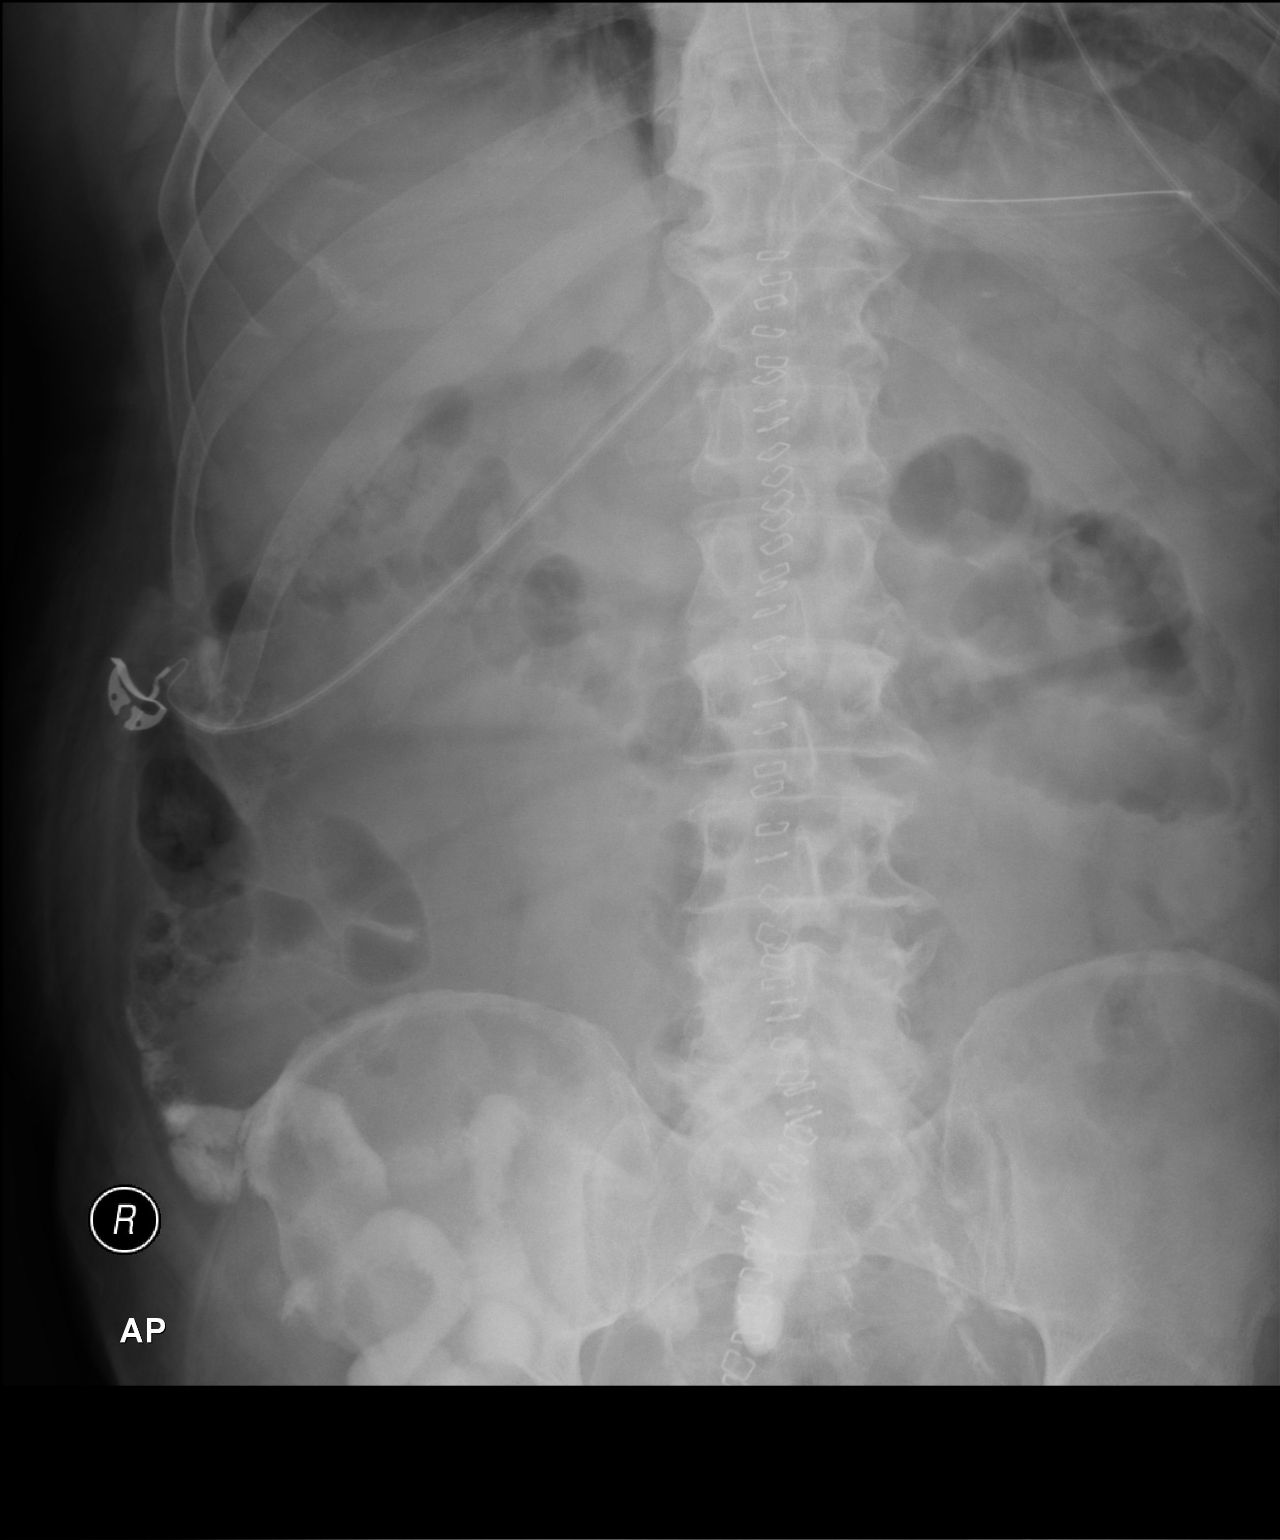

[1 of 1 positions shown; findings below may reference images not displayed]

FINDINGS: The patient's enteric tube is noted ending overlying the body of the
stomach, with the side port about the gastroesophageal junction.

The visualized bowel gas pattern is unremarkable. Scattered air and
stool filled loops of colon are seen; no abnormal dilatation of
small bowel loops is seen to suggest small bowel obstruction.
Residual contrast is noted at the right lower quadrant. No free
intra-abdominal air is identified, though evaluation for free air is
limited on a single supine view.

The visualized osseous structures are within normal limits; the
sacroiliac joints are unremarkable in appearance. Skin staples are
noted along the midline anterior abdominal wall.
IMPRESSION: 1. Enteric tube noted ending overlying the body of the stomach, with
the side port about the gastroesophageal junction.
2. Unremarkable bowel gas pattern; no free intra-abdominal air seen.

## 2016-02-25 IMAGING — CR DG CHEST 1V PORT
1 series · 1 of 1 positions shown · non-contrast
Comparison: 05/22/2015 ; 05/21/2015; CT abdomen pelvis -05/22/2015

CLINICAL DATA: Shortness of breath earlier today.

EXAM:
PORTABLE CHEST 1 VIEW

[AP]
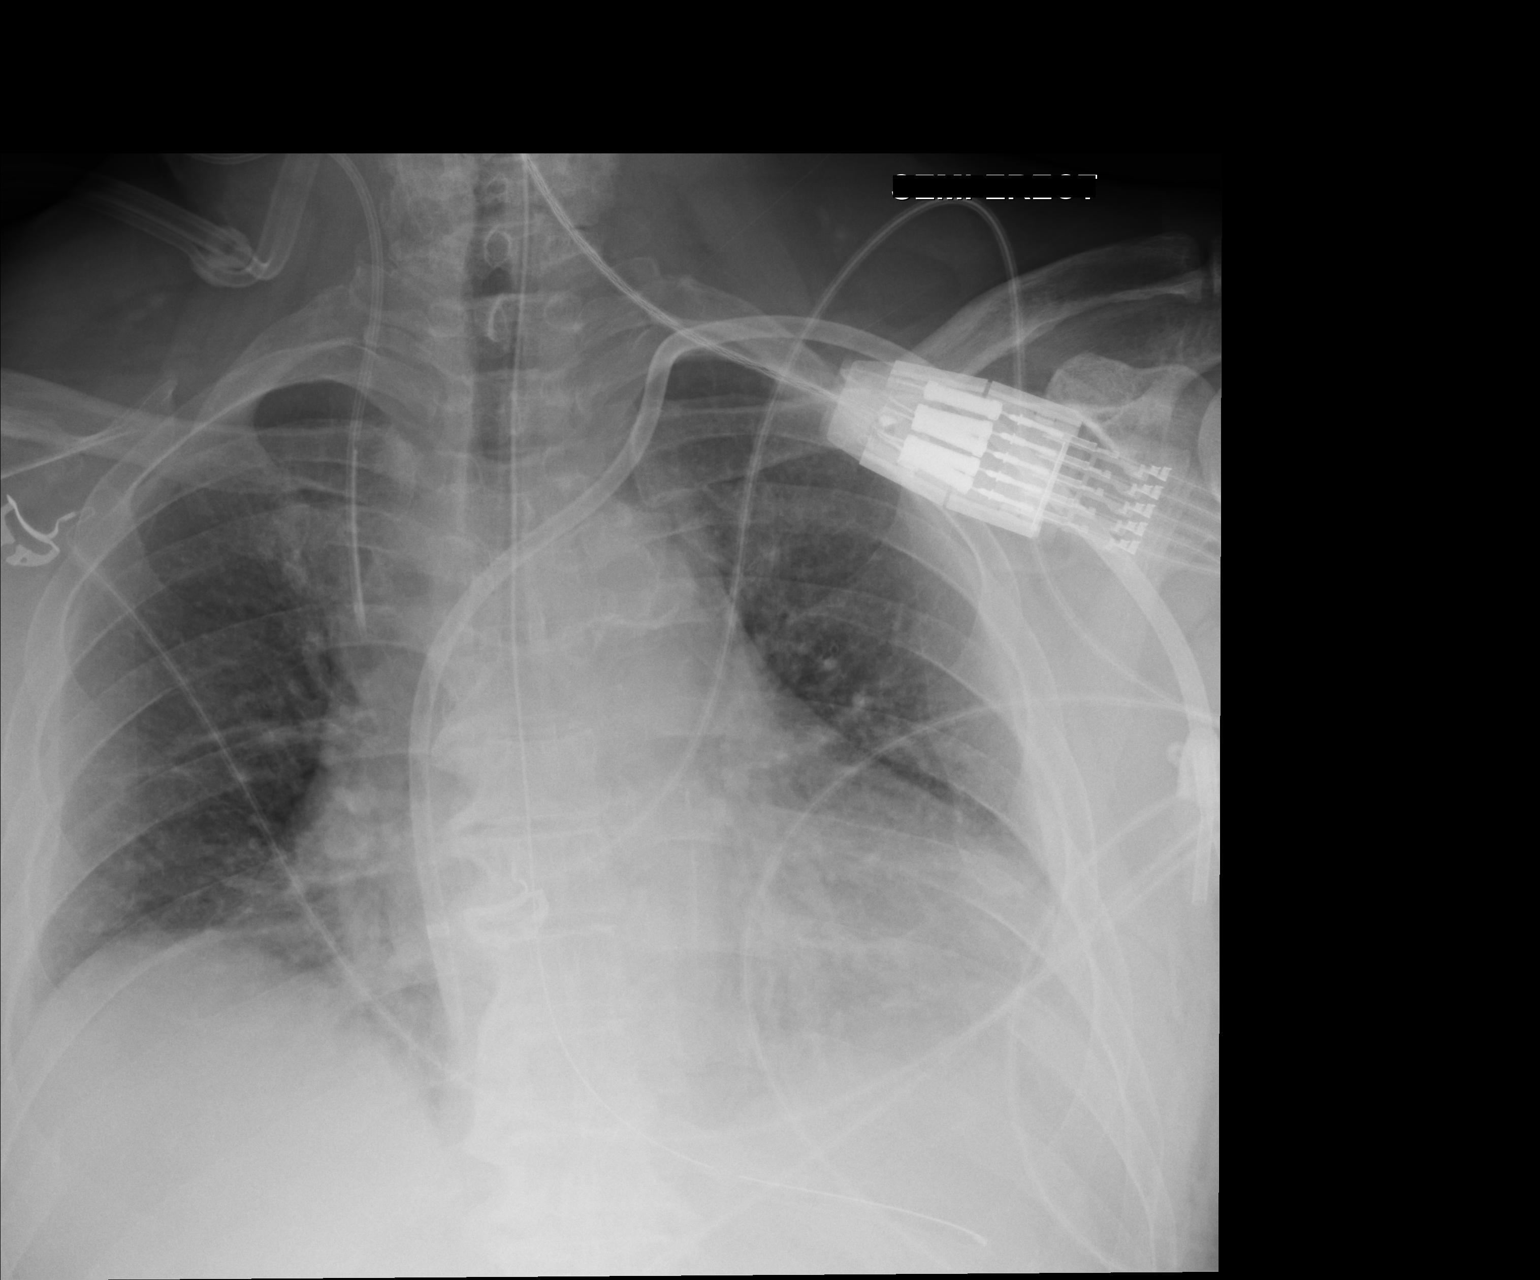

[1 of 1 positions shown; findings below may reference images not displayed]

FINDINGS: Grossly unchanged enlarged cardiac silhouette and mediastinal
contours. Interval placement of left jugular approach dialysis
catheter with tip projected over the superior aspect the right
atrium. Otherwise, stable position of support apparatus. Lung
volumes remain reduced with grossly unchanged bibasilar opacities,
left greater than right. Pulmonary vascular peer slight Siau Long less
distinct than present examination with cephalization of flow. Trace
bilateral effusions are not excluded. No pneumothorax. Unchanged
bones.
IMPRESSION: 1. Interval placement of left jugular approach dialysis catheter
with tip projected over the superior aspect the right atrium.
Otherwise, stable position of support apparatus. No pneumothorax.
2. Suspected mildly worsened pulmonary edema.
3. Similar findings of hypoventilation and bibasilar opacities,
likely atelectasis.

## 2016-08-26 ENCOUNTER — Telehealth: Payer: Self-pay | Admitting: *Deleted

## 2016-08-26 NOTE — Telephone Encounter (Signed)
Dr. Foy GuadalajaraFried called me back and said that there is an area in his left groin that is sore and somewhat red. There is no drainage and Mr. Robert [name]'s fever is back up to 101.2. He is going to start him on some Augmentin today and asked that we see him tomorrow because he can't rule out if this is just superficial or is communicating with the graft. I made patient an appt with our NP at 10:15 tomorrow, Dr. Imogene Burnhen is also in the office seeing morning patient before going to the OR, (he is on call this weekend). Dr. Foy GuadalajaraFried will instruct the patient to pick up Augmentin and come tomorrow.

## 2016-08-26 NOTE — Telephone Encounter (Signed)
Mr. Robert [name] called earlier this morning to triage to report that he had a small area that was sore to the touch around the lower part of his Aortobifem incision. He had a 101 temp last night and it was 99 this morning. He is not having any N&V, or bowel / bladder issues.  I asked him to contact his PCP and call me back afterwards.

## 2016-08-27 ENCOUNTER — Encounter (HOSPITAL_COMMUNITY): Payer: Self-pay

## 2016-08-27 ENCOUNTER — Inpatient Hospital Stay (HOSPITAL_COMMUNITY): Payer: 59

## 2016-08-27 ENCOUNTER — Inpatient Hospital Stay (HOSPITAL_COMMUNITY)
Admission: AD | Admit: 2016-08-27 | Discharge: 2016-09-08 | DRG: 982 | Disposition: A | Discharge status: Home Health | Payer: 59 | Source: Ambulatory Visit | Attending: Vascular Surgery | Admitting: Vascular Surgery

## 2016-08-27 ENCOUNTER — Ambulatory Visit (INDEPENDENT_AMBULATORY_CARE_PROVIDER_SITE_OTHER): Payer: 59 | Admitting: Family

## 2016-08-27 ENCOUNTER — Encounter: Payer: Self-pay | Admitting: Family

## 2016-08-27 VITALS — BP 146/72 | HR 92 | Temp 99.1°F | Resp 16 | Ht 66.0 in | Wt 194.0 lb

## 2016-08-27 DIAGNOSIS — N179 Acute kidney failure, unspecified: Secondary | ICD-10-CM | POA: Diagnosis present

## 2016-08-27 DIAGNOSIS — L089 Local infection of the skin and subcutaneous tissue, unspecified: Secondary | ICD-10-CM

## 2016-08-27 DIAGNOSIS — E669 Obesity, unspecified: Secondary | ICD-10-CM | POA: Diagnosis present

## 2016-08-27 DIAGNOSIS — R1030 Lower abdominal pain, unspecified: Secondary | ICD-10-CM | POA: Diagnosis present

## 2016-08-27 DIAGNOSIS — Z95828 Presence of other vascular implants and grafts: Secondary | ICD-10-CM

## 2016-08-27 DIAGNOSIS — I739 Peripheral vascular disease, unspecified: Secondary | ICD-10-CM

## 2016-08-27 DIAGNOSIS — I129 Hypertensive chronic kidney disease with stage 1 through stage 4 chronic kidney disease, or unspecified chronic kidney disease: Secondary | ICD-10-CM | POA: Diagnosis present

## 2016-08-27 DIAGNOSIS — E781 Pure hyperglyceridemia: Secondary | ICD-10-CM | POA: Diagnosis present

## 2016-08-27 DIAGNOSIS — Z6831 Body mass index (BMI) 31.0-31.9, adult: Secondary | ICD-10-CM | POA: Diagnosis not present

## 2016-08-27 DIAGNOSIS — N183 Chronic kidney disease, stage 3 (moderate): Secondary | ICD-10-CM | POA: Diagnosis present

## 2016-08-27 DIAGNOSIS — M549 Dorsalgia, unspecified: Secondary | ICD-10-CM | POA: Diagnosis present

## 2016-08-27 DIAGNOSIS — E1151 Type 2 diabetes mellitus with diabetic peripheral angiopathy without gangrene: Secondary | ICD-10-CM | POA: Diagnosis present

## 2016-08-27 DIAGNOSIS — E1122 Type 2 diabetes mellitus with diabetic chronic kidney disease: Secondary | ICD-10-CM | POA: Diagnosis present

## 2016-08-27 DIAGNOSIS — Z87891 Personal history of nicotine dependence: Secondary | ICD-10-CM | POA: Diagnosis not present

## 2016-08-27 DIAGNOSIS — L02214 Cutaneous abscess of groin: Secondary | ICD-10-CM | POA: Diagnosis not present

## 2016-08-27 DIAGNOSIS — H269 Unspecified cataract: Secondary | ICD-10-CM | POA: Diagnosis present

## 2016-08-27 DIAGNOSIS — G8929 Other chronic pain: Secondary | ICD-10-CM | POA: Diagnosis present

## 2016-08-27 DIAGNOSIS — Y832 Surgical operation with anastomosis, bypass or graft as the cause of abnormal reaction of the patient, or of later complication, without mention of misadventure at the time of the procedure: Secondary | ICD-10-CM | POA: Diagnosis present

## 2016-08-27 DIAGNOSIS — Z7982 Long term (current) use of aspirin: Secondary | ICD-10-CM | POA: Diagnosis not present

## 2016-08-27 DIAGNOSIS — H409 Unspecified glaucoma: Secondary | ICD-10-CM | POA: Diagnosis present

## 2016-08-27 DIAGNOSIS — I350 Nonrheumatic aortic (valve) stenosis: Secondary | ICD-10-CM | POA: Diagnosis present

## 2016-08-27 DIAGNOSIS — I745 Embolism and thrombosis of iliac artery: Secondary | ICD-10-CM | POA: Diagnosis not present

## 2016-08-27 DIAGNOSIS — I959 Hypotension, unspecified: Secondary | ICD-10-CM | POA: Diagnosis present

## 2016-08-27 DIAGNOSIS — T827XXA Infection and inflammatory reaction due to other cardiac and vascular devices, implants and grafts, initial encounter: Secondary | ICD-10-CM

## 2016-08-27 DIAGNOSIS — Z79899 Other long term (current) drug therapy: Secondary | ICD-10-CM

## 2016-08-27 DIAGNOSIS — A491 Streptococcal infection, unspecified site: Secondary | ICD-10-CM

## 2016-08-27 DIAGNOSIS — T148XXA Other injury of unspecified body region, initial encounter: Secondary | ICD-10-CM

## 2016-08-27 LAB — CBC
HCT: 36.2 % — ABNORMAL LOW (ref 39.0–52.0)
Hemoglobin: 11.8 g/dL — ABNORMAL LOW (ref 13.0–17.0)
MCH: 29.7 pg (ref 26.0–34.0)
MCHC: 32.6 g/dL (ref 30.0–36.0)
MCV: 91.2 fL (ref 78.0–100.0)
PLATELETS: 190 10*3/uL (ref 150–400)
RBC: 3.97 MIL/uL — ABNORMAL LOW (ref 4.22–5.81)
RDW: 12.8 % (ref 11.5–15.5)
WBC: 18.7 10*3/uL — ABNORMAL HIGH (ref 4.0–10.5)

## 2016-08-27 LAB — COMPREHENSIVE METABOLIC PANEL
ALBUMIN: 3.7 g/dL (ref 3.5–5.0)
ALT: 16 U/L — ABNORMAL LOW (ref 17–63)
ANION GAP: 12 (ref 5–15)
AST: 24 U/L (ref 15–41)
Alkaline Phosphatase: 43 U/L (ref 38–126)
BILIRUBIN TOTAL: 0.8 mg/dL (ref 0.3–1.2)
BUN: 15 mg/dL (ref 6–20)
CO2: 24 mmol/L (ref 22–32)
Calcium: 9.4 mg/dL (ref 8.9–10.3)
Chloride: 99 mmol/L — ABNORMAL LOW (ref 101–111)
Creatinine, Ser: 1.59 mg/dL — ABNORMAL HIGH (ref 0.61–1.24)
GFR calc Af Amer: 51 mL/min — ABNORMAL LOW (ref >60)
GFR, EST NON AFRICAN AMERICAN: 44 mL/min — AB (ref >60)
GLUCOSE: 141 mg/dL — AB (ref 65–99)
POTASSIUM: 3.5 mmol/L (ref 3.5–5.1)
Sodium: 135 mmol/L (ref 135–145)
TOTAL PROTEIN: 7.2 g/dL (ref 6.5–8.1)

## 2016-08-27 LAB — URINALYSIS, ROUTINE W REFLEX MICROSCOPIC
Bilirubin Urine: NEGATIVE
GLUCOSE, UA: NEGATIVE mg/dL
HGB URINE DIPSTICK: NEGATIVE
Ketones, ur: NEGATIVE mg/dL
Leukocytes, UA: NEGATIVE
NITRITE: NEGATIVE
PH: 5 (ref 5.0–8.0)
Protein, ur: 30 mg/dL — AB
SPECIFIC GRAVITY, URINE: 1.017 (ref 1.005–1.030)
Squamous Epithelial / LPF: NONE SEEN

## 2016-08-27 LAB — VAS US LOWER EXTREMITY ARTERIAL DUPLEX
Left super femoral prox sys PSV: 245 cm/s
RIGHT ANT DIST TIBAL SYS PSV: 29 cm/s
RIGHT ANT MID TIBIAL SYS PSV: 43 cm/s
RIGHT PERO MID SYS: 28 cm/s
RIGHT POST TIB MID SYS: 66 cm/s
RPOPDPSV: 66 cm/s
RPOPPPSV: 75 cm/s
RSFMPSV: -181 cm/s
RSFPPSV: 252 cm/s
RTIBDISTSYS: 89 cm/s
Right ant tibeal sys PSV: 45 cm/s
Right peroneal sys PSV: -41 cm/s
Right post tibial sys PSV: 78 cm/s
Right super femoral dist sys PSV: -151 cm/s

## 2016-08-27 LAB — PROTIME-INR
INR: 1.22
PROTHROMBIN TIME: 15.4 s — AB (ref 11.4–15.2)

## 2016-08-27 MED ORDER — POTASSIUM CHLORIDE CRYS ER 20 MEQ PO TBCR
20.0000 meq | EXTENDED_RELEASE_TABLET | Freq: Once | ORAL | Status: AC
  Administered 2016-08-27: 20 meq via ORAL
  Filled 2016-08-27: qty 1

## 2016-08-27 MED ORDER — HYDROCHLOROTHIAZIDE 25 MG PO TABS
25.0000 mg | ORAL_TABLET | Freq: Every morning | ORAL | Status: DC
  Administered 2016-08-29 – 2016-09-08 (×10): 25 mg via ORAL
  Filled 2016-08-27 (×10): qty 1

## 2016-08-27 MED ORDER — PANTOPRAZOLE SODIUM 40 MG PO TBEC
40.0000 mg | DELAYED_RELEASE_TABLET | Freq: Every day | ORAL | Status: DC
  Administered 2016-08-27 – 2016-09-08 (×11): 40 mg via ORAL
  Filled 2016-08-27 (×11): qty 1

## 2016-08-27 MED ORDER — SODIUM CHLORIDE 0.9 % IV SOLN
INTRAVENOUS | Status: DC
  Administered 2016-08-27 – 2016-08-28 (×2): via INTRAVENOUS

## 2016-08-27 MED ORDER — VANCOMYCIN HCL 10 G IV SOLR
1500.0000 mg | Freq: Once | INTRAVENOUS | Status: AC
  Administered 2016-08-27: 1500 mg via INTRAVENOUS
  Filled 2016-08-27: qty 1500

## 2016-08-27 MED ORDER — HEPARIN SODIUM (PORCINE) 5000 UNIT/ML IJ SOLN
5000.0000 [IU] | Freq: Three times a day (TID) | INTRAMUSCULAR | Status: DC
  Administered 2016-08-27 – 2016-09-07 (×30): 5000 [IU] via SUBCUTANEOUS
  Filled 2016-08-27 (×30): qty 1

## 2016-08-27 MED ORDER — LORAZEPAM 1 MG PO TABS: 1.0000 mg | ORAL_TABLET | Freq: Once | ORAL | Status: DC

## 2016-08-27 MED ORDER — GUAIFENESIN-DM 100-10 MG/5ML PO SYRP: 15.0000 mL | ORAL_SOLUTION | ORAL | Status: DC | PRN

## 2016-08-27 MED ORDER — LABETALOL HCL 5 MG/ML IV SOLN: 10.0000 mg | INTRAVENOUS | Status: DC | PRN

## 2016-08-27 MED ORDER — ONDANSETRON HCL 4 MG/2ML IJ SOLN
4.0000 mg | Freq: Four times a day (QID) | INTRAMUSCULAR | Status: DC | PRN
Start: 2016-08-27 — End: 2016-09-08

## 2016-08-27 MED ORDER — PHENOL 1.4 % MT LIQD: 1.0000 | OROMUCOSAL | Status: DC | PRN

## 2016-08-27 MED ORDER — FENOFIBRATE 160 MG PO TABS
160.0000 mg | ORAL_TABLET | Freq: Every day | ORAL | Status: DC
  Administered 2016-08-29 – 2016-09-08 (×10): 160 mg via ORAL
  Filled 2016-08-27 (×10): qty 1

## 2016-08-27 MED ORDER — MORPHINE SULFATE (PF) 4 MG/ML IV SOLN: 2.0000 mg | INTRAVENOUS | Status: DC | PRN

## 2016-08-27 MED ORDER — VANCOMYCIN HCL IN DEXTROSE 750-5 MG/150ML-% IV SOLN
750.0000 mg | Freq: Two times a day (BID) | INTRAVENOUS | Status: DC
  Administered 2016-08-28 – 2016-08-30 (×5): 750 mg via INTRAVENOUS
  Filled 2016-08-27 (×6): qty 150

## 2016-08-27 MED ORDER — HYDRALAZINE HCL 20 MG/ML IJ SOLN: 5.0000 mg | INTRAMUSCULAR | Status: DC | PRN

## 2016-08-27 MED ORDER — PIPERACILLIN-TAZOBACTAM 3.375 G IVPB
3.3750 g | Freq: Three times a day (TID) | INTRAVENOUS | Status: DC
  Administered 2016-08-27 – 2016-08-30 (×9): 3.375 g via INTRAVENOUS
  Filled 2016-08-27 (×10): qty 50

## 2016-08-27 MED ORDER — ALUM & MAG HYDROXIDE-SIMETH 200-200-20 MG/5ML PO SUSP: 15.0000 mL | ORAL | Status: DC | PRN

## 2016-08-27 MED ORDER — TRAMADOL HCL 50 MG PO TABS
50.0000 mg | ORAL_TABLET | Freq: Four times a day (QID) | ORAL | Status: DC | PRN
  Administered 2016-08-27 – 2016-09-07 (×16): 50 mg via ORAL
  Filled 2016-08-27 (×16): qty 1

## 2016-08-27 MED ORDER — ASPIRIN EC 81 MG PO TBEC
81.0000 mg | DELAYED_RELEASE_TABLET | Freq: Every day | ORAL | Status: DC
  Administered 2016-08-29 – 2016-09-08 (×10): 81 mg via ORAL
  Filled 2016-08-27 (×10): qty 1

## 2016-08-27 MED ORDER — DIPHENHYDRAMINE HCL 25 MG PO CAPS
25.0000 mg | ORAL_CAPSULE | Freq: Every day | ORAL | Status: DC
  Administered 2016-08-27 – 2016-09-07 (×12): 25 mg via ORAL
  Filled 2016-08-27 (×12): qty 1

## 2016-08-27 MED ORDER — EZETIMIBE-SIMVASTATIN 10-40 MG PO TABS
1.0000 | ORAL_TABLET | Freq: Every day | ORAL | Status: DC
  Administered 2016-08-27 – 2016-09-08 (×11): 1 via ORAL
  Filled 2016-08-27 (×13): qty 1

## 2016-08-27 MED ORDER — METOPROLOL TARTRATE 5 MG/5ML IV SOLN: 2.0000 mg | INTRAVENOUS | Status: DC | PRN

## 2016-08-27 NOTE — Progress Notes (Addendum)
Pharmacy Antibiotic Note  Robert DeutscherRobert [name] is a 66 y.o. 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  Pharmacy has been consulted for vanc/zosyn dosing. No recent labs, CBC, BMET, blood cultures are pending. Noted pt with CKD.   Plan: Vancomycin 1500 mg IV x 1 F/u renal function labs to order maintenance dose. Goal trough 15-20 mcg/mL. Zosyn 3.375g IV Q 8 hrs F/u cultures.   Height: 5' 5.98" (167.6 cm) Weight: 193 lb (87.5 kg) IBW/kg (Calculated) : 63.76  Temp (24hrs), Avg:98.9 F (37.2 C), Min:98.7 F (37.1 C), Max:99.1 F (37.3 C)  No results for input(s): WBC, CREATININE, LATICACIDVEN, VANCOTROUGH, VANCOPEAK, VANCORANDOM, GENTTROUGH, GENTPEAK, GENTRANDOM, TOBRATROUGH, TOBRAPEAK, TOBRARND, AMIKACINPEAK, AMIKACINTROU, AMIKACIN in the last 168 hours.  CrCl cannot be calculated (Patient's most recent lab result is older than the maximum 21 days allowed.).    Allergies  Allergen Reactions  . Valsartan-Hydrochlorothiazide Other (See Comments)    Renal failure    Antimicrobials this admission: Vancomycin 5/18 >>  Zosyn 5/18 >>   Dose adjustments this admission:   Microbiology results: 5/18 BCx:    Thank you for allowing pharmacy to be a part of this patient's care.  Bayard HuggerMei Bell, PharmD, BCPS  Clinical Pharmacist  Pager: [phone]   08/27/2016 1:31 PM    Addendum -SCr 1.59, eCrCl -Vancomycin 750/12h  Robert [name], Robert [name]  08/27/2016 5:37 PM

## 2016-08-27 NOTE — Progress Notes (Signed)
[facility] [place]   CC: Follow up peripheral artery occlusive disease  History of Present Illness Robert [name] is a 66 y.o. male who is s/p ABF bypass graft complicated by ARF sec. ARB/hypotension, UHR, L iliofem BPA (Date: 05/21/15) by Dr. Imogene Burn.   He returns today at request of Dr. Foy Guadalajara. Dr. Foy Guadalajara spoke with [facility] triage nurse, said that there is an area in his left groin that is sore and somewhat red. There is no drainage and [name] fever is back up to 101.2. He is going to start him on some Augmentin today (yesterday), Rocephin 1 gm given IM in [place] walk in in clinic yesterday; he asked that we see him tomorrow because he can't rule out if this is just superficial or is communicating with the graft.  Dr. Imogene Burn last evaluated pt on 09-12-15. At that time both groins were healed and feet were warm with faintly palpable pulses in feet. Dr. Imogene Burn advised follow up in one year of aortoiliac duplex and BLE ABI.  Pt denies any claudication symptoms in his legs with walking, denies any history of stroke or TIA.   Pt Diabetic: pre diabetes Pt smoker: former smoker  Pt meds include: Statin :Yes Betablocker: No ASA: Yes Other anticoagulants/antiplatelets: no  Past Medical History:  Diagnosis Date  . Cataracts, bilateral    immature  . Chronic back pain    ruptured disc  . Chronic kidney disease    Stage III  Moderate  . Glaucoma    uses eye drops nightly  . History of bronchitis as a child   . Hyperlipidemia    takes Tricor and Vytorin daily  . Hypertension    takes Diovan HCT daily  . Hypertension 04/10/2015  . Nocturia   . Peripheral vascular disease [facility])     Social History Social History  Substance Use Topics  . Smoking status: Former Games developer  . Smokeless tobacco: Never Used     Comment: quit smoking 11 yrs ago  . Alcohol use 0.0 oz/week     Comment: couple of beers a day    Family History Family History  Problem Relation Age of Onset  .  Hyperlipidemia Father   . Peripheral vascular disease Father        amputation  . Cancer Brother        colon/bowel cancer  . Hyperlipidemia Brother   . Cancer Mother   . Liver disease Mother   . Cancer Maternal Grandmother   . Heart disease Paternal Grandmother   . Diabetes Paternal Grandmother     Past Surgical History:  Procedure Laterality Date  . AORTA - BILATERAL FEMORAL ARTERY BYPASS GRAFT N/A 05/21/2015   Procedure: AORTOBIFEMORAL BYPASS GRAFT USING HEMASHIELD GOLD 14X8MM X 40CM VASCULAR GRAFT;  Surgeon: Fransisco Hertz, MD;  Location: [facility] OR;  Service: Vascular;  Laterality: N/A;  . ENDARTERECTOMY FEMORAL Bilateral 05/21/2015   Procedure: ENDARTERECTOMY BILATERAL FEMORAL ARTERIES;  Surgeon: Fransisco Hertz, MD;  Location: [facility] OR;  Service: Vascular;  Laterality: Bilateral;  . INSERTION OF DIALYSIS CATHETER N/A 05/23/2015   Procedure: INSERTION OF DIALYSIS CATHETER left internal jugular;  Surgeon: Chuck Hint, MD;  Location: [facility] OR;  Service: Vascular;  Laterality: N/A;  . PATCH ANGIOPLASTY Left 05/21/2015   Procedure: LEFT FEMORAL ARTERY PATCH ANGIOPLASTY USING Livia Snellen BIOLOGIC PATCH;  Surgeon: Fransisco Hertz, MD;  Location: [facility] OR;  Service: Vascular;  Laterality: Left;  . PERIPHERAL VASCULAR CATHETERIZATION N/A 03/13/2015   Procedure:  Abdominal Aortogram;  Surgeon: Fransisco HertzBrian L Chen, MD;  [facility];  Service: Cardiovascular;  Laterality: N/A;    Allergies  Allergen Reactions  . Valsartan-Hydrochlorothiazide Other (See Comments)    Renal failure    Current Outpatient Prescriptions  Medication Sig Dispense Refill  . aspirin 81 MG tablet Take 81 mg by mouth daily.    . ciclopirox (LOPROX) 0.77 % cream Apply 1 application topically 2 (two) times daily as needed. Gel    . diphenhydrAMINE (BENADRYL) 25 mg capsule Take 25 mg by mouth at bedtime.    Marland Kitchen. ezetimibe-simvastatin (VYTORIN) 10-40 MG per tablet Take 1 tablet by mouth daily.    . fenofibrate (TRICOR) 145 MG  tablet Take 145 mg by mouth daily.    . hydrochlorothiazide (HYDRODIURIL) 25 MG tablet Take 25 mg by mouth every morning.  11  . traMADol (ULTRAM) 50 MG tablet Take 1 tablet (50 mg total) by mouth every 6 (six) hours as needed for moderate pain. For PAIN   PRN 30 tablet 0   No current facility-administered medications for this visit.     ROS: See HPI for pertinent positives and negatives.   Physical Examination  Vitals:   08/27/16 1008 08/27/16 1010  BP: (!) 149/73 (!) 146/72  Pulse: 84 92  Resp: 16 16  Temp:  99.1 F (37.3 C)  TempSrc:  Oral  SpO2: 96% 98%  Weight: 194 lb (88 kg) 194 lb (88 kg)  Height: 5\' 6"  (1.676 m) 5\' 6"  (1.676 m)   Body mass index is 31.31 kg/m.  General: A&O x 3, WDWN obese male. Gait: normal Eyes: PERRLA. Pulmonary: Respirations are non labored, CTAB, good air movement Cardiac: regular Rhythm, no detected murmur.         Carotid Bruits Right Left   Negative Negative  Aorta is not palpable. Radial pulses: palpable bilaterally                           VASCULAR EXAM: Extremities without ischemic changes, without Gangrene; without open wounds.                                                                                                          LE Pulses Right Left       FEMORAL  3+ palpable  3+ palpable        POPLITEAL  1+ palpable   1+ palpable       POSTERIOR TIBIAL  2+ palpable   2+ palpable        DORSALIS PEDIS      ANTERIOR TIBIAL faintly palpable  2+ palpable    Abdomen: soft, NT; erythema, moderate swelling, and mild tenderness at right groin. Swelling and erythema extend into pubis Skin: no rashes, no ulcers noted. Musculoskeletal: no muscle wasting or atrophy.  Neurologic: A&O X 3; Appropriate Affect ; SENSATION: normal; MOTOR FUNCTION:  moving all extremities equally, motor strength 5/5 throughout. Speech is fluent/normal. CN 2-12 intact.     ASSESSMENT: Robert DeutscherRobert [name] is  a 66 y.o. male who presents with pain,  swelling, and erythema right groin and pubis for 3 days, with fever. He is s/p aortobifemoral bypass graft on 05/21/15. WBC was 17 yesterday from his PCP's office.  Dr. Imogene Burn spoke with and examined pt.  PLAN:  Admit to 2W, Dr. Imogene Burn, possible infected aortobifemoral bypass graft. CTA abd/pelvis. Antibiotics to be managed by pharmacy.  Preoperative labs to be drawn.    Charisse March, RN, MSN, FNP-C [facility] [place] Phone: [phone]  Clinic MD: Imogene Burn  08/27/16 11:03 AM

## 2016-08-27 NOTE — Progress Notes (Signed)
Daily Progress Note  This patient presents with right fluid collection and low grade fevers at home.  He previous underwent ABF with bilateral iliofemoral EA and L BPA.  Due to his obesity, he had some right groin wound separation that was managed successful with a VAC.  He reportedly started having some right groin discomfort on Tuesday which been increasing.  On exam, he has some erythema and ballotable fluid collection in the right groin.  Labs are pending  Radiology   Ct Abdomen Pelvis Wo Contrast  Result Date: 08/27/2016 CLINICAL DATA:  Wound infection. 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 EXAM: CT ABDOMEN AND PELVIS WITHOUT CONTRAST TECHNIQUE: Multidetector CT imaging of the abdomen and pelvis was performed following the standard protocol without IV contrast. COMPARISON:  05/22/2015 and 06/19/2007 FINDINGS: Lower chest: Lung bases are within normal. Hepatobiliary: Mild cholelithiasis. Liver and biliary tree are within normal. Pancreas: Within normal. Spleen: Within normal. Adrenals/Urinary Tract: Adrenal glands are normal. Kidneys are normal size without hydronephrosis or nephrolithiasis. Several renal vascular calcification are present over the renal pelvic/hilar vessels. Bladder and ureters are normal. Stomach/Bowel: The stomach and small bowel are within normal. Appendix is within normal. Mild moderate diverticulosis throughout the colon. Vascular/Lymphatic: Calcified plaque over the abdominal aorta. There is evidence of patient's aortobifemoral bypass graft be changeable the renal arteries extending to the inguinal regions. Graft is not changed. Graft patency cannot be assessed due to lack of intravenous contrast. The touchdown site over the left inguinal region is within normal. The touchdown site over the right inguinal region demonstrates increase in size of a an oval focal fluid collection immediately superficial to the distal right iliac graft/ touchdown site measuring 2.5  x 3.9 x 5.0 cm in transverse, AP and craniocaudal dimensions. This may represent a postoperative infected versus noninfected collection. Minimal subcutaneous edema adjacent this fluid collection. Cannot completely evaluate its relationship with the immediately adjacent deep vessels on this noncontrast study. No adenopathy. Reproductive: Within normal. Other: No significant free peritoneal fluid. Postsurgical change over the midline anterior abdominal wall. Musculoskeletal: Degenerate change of the spine and hips. Moderate disc space narrowing at the L4-5 level. 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 Graft patency cannot be assessed due to lack of intravenous contrast. There is worsening of a focal oval fluid collection immediately superficial to and abutting the distal right iliac graft/ touchdown site measuring 2.5 x 3.9 x 5.0 cm with mild adjacent subcutaneous edema. This may represent an infected versus noninfected postoperative collection as cannot accurately assess its relationship to the immediately adjacent deep vessels. Consider ultrasound for further evaluation. Mild cholelithiasis. Diverticulosis of the colon. Electronically Signed   By: Elberta Fortisaniel  Boyle M.D.   On: 08/27/2016 14:01   Based on my review of the CT, the patient has fluid collection superificial to the right aortobifemoral limb.  Most of the graft demonstrates no evidence of infection on this CT.  - Pt admitted to hospital for presumed possible infection of R aortobifemoral limb - Unfortunately, pt given some food upon arrival so can't go to OR immediately - IV abx: Vanc/Zosyn, will get ID's involvement tomorrow - Arterial duplex of R leg to determine if AK popliteal artery adequate target for R ax-pop - Preop Labs including blood cultures pending at this point - Will plan on: right groin exploration, incision and drainage of right groin fluid collection, possible excision of right  aortofemoral graft, possible right axillopopliteal bypass, VAC placement. - We had a frank discussion  in regards the 50-100% mortality rate of infected aortic graft. -  The risk, benefits, and alternative for the proposed operations were discussed with the patient.   -  The patient is aware the risks include but are not limited to: bleeding, infection, myocardial infarction, stroke, limb loss, nerve damage, need for additional procedures in the future, wound complications, limited patency of such bypasses and inability to complete the bypass.  - The patient is aware of these risks and agreed to proceed.   Leonides Sake, MD, FACS [facility] [place] Office: [phone] Pager: [phone]  08/27/2016, 4:01 PM

## 2016-08-27 NOTE — Progress Notes (Addendum)
*  PRELIMINARY RESULTS* Vascular Ultrasound Lower Extremity Arterial Duplex has been completed.  There is mixed plaque noted throughout the right lower extremity arteries.  There is evidence of elevated velocities in the right femoral artery suggestive of high range 30-49% versus low range 50-74% stenosis. The left common femoral artery appears to be dilated versus aneurysmal, measuring 1.8cm.  The left proximal femoral artery exhibits elevated velocities suggesive of  high range 30-49% versus low range 50-74% stenosis. Incidental finding: there is an anechoic area of the right groin measuring 4.7cm, suggestive of possible fluid collection.   Preliminary results discussed with Dr. Imogene Burnhen.  08/27/2016 5:20 PM Gertie FeyMichelle Taquita Demby, BS, RVT, RDCS, RDMS

## 2016-08-27 NOTE — H&P (Signed)
   History and Physical  See my NP's progress note (08/27/16)   Leonides SakeBrian Kassadie Pancake, MD, FACS [facility] [place] Office: [phone] Pager: [phone]  08/27/2016, 3:58 PM

## 2016-08-28 ENCOUNTER — Encounter (HOSPITAL_COMMUNITY)
Admission: AD | Disposition: A | Discharge status: Home Health | Payer: Self-pay | Source: Ambulatory Visit | Attending: Vascular Surgery

## 2016-08-28 ENCOUNTER — Inpatient Hospital Stay (HOSPITAL_COMMUNITY): Payer: 59 | Admitting: Anesthesiology

## 2016-08-28 ENCOUNTER — Encounter (HOSPITAL_COMMUNITY): Payer: Self-pay

## 2016-08-28 DIAGNOSIS — L02214 Cutaneous abscess of groin: Secondary | ICD-10-CM

## 2016-08-28 HISTORY — PX: APPLICATION OF WOUND VAC: SHX5189

## 2016-08-28 HISTORY — PX: AXILLARY-FEMORAL BYPASS GRAFT: SHX894

## 2016-08-28 LAB — CBC
HEMATOCRIT: 33.8 % — AB (ref 39.0–52.0)
HEMOGLOBIN: 11.1 g/dL — AB (ref 13.0–17.0)
MCH: 29.8 pg (ref 26.0–34.0)
MCHC: 32.8 g/dL (ref 30.0–36.0)
MCV: 90.9 fL (ref 78.0–100.0)
Platelets: 203 10*3/uL (ref 150–400)
RBC: 3.72 MIL/uL — AB (ref 4.22–5.81)
RDW: 12.8 % (ref 11.5–15.5)
WBC: 14.7 10*3/uL — ABNORMAL HIGH (ref 4.0–10.5)

## 2016-08-28 LAB — BASIC METABOLIC PANEL
ANION GAP: 9 (ref 5–15)
BUN: 14 mg/dL (ref 6–20)
CHLORIDE: 100 mmol/L — AB (ref 101–111)
CO2: 26 mmol/L (ref 22–32)
Calcium: 8.9 mg/dL (ref 8.9–10.3)
Creatinine, Ser: 1.63 mg/dL — ABNORMAL HIGH (ref 0.61–1.24)
GFR calc non Af Amer: 43 mL/min — ABNORMAL LOW (ref >60)
GFR, EST AFRICAN AMERICAN: 50 mL/min — AB (ref >60)
Glucose, Bld: 107 mg/dL — ABNORMAL HIGH (ref 65–99)
POTASSIUM: 3.5 mmol/L (ref 3.5–5.1)
SODIUM: 135 mmol/L (ref 135–145)

## 2016-08-28 LAB — SURGICAL PCR SCREEN
MRSA, PCR: NEGATIVE
STAPHYLOCOCCUS AUREUS: POSITIVE — AB

## 2016-08-28 LAB — HIV ANTIBODY (ROUTINE TESTING W REFLEX): HIV SCREEN 4TH GENERATION: NONREACTIVE

## 2016-08-28 SURGERY — CREATION, BYPASS, ARTERIAL, AXILLARY TO BILATERAL FEMORAL, USING GRAFT
Anesthesia: General | Site: Groin | Laterality: Right

## 2016-08-28 MED ORDER — NON FORMULARY
Status: DC | PRN
  Administered 2016-08-28: 6 mL via SURGICAL_CAVITY

## 2016-08-28 MED ORDER — MIDAZOLAM HCL 5 MG/5ML IJ SOLN
INTRAMUSCULAR | Status: DC | PRN
  Administered 2016-08-28: 2 mg via INTRAVENOUS

## 2016-08-28 MED ORDER — OXYCODONE HCL 5 MG PO TABS: 5.0000 mg | ORAL_TABLET | Freq: Once | ORAL | Status: DC | PRN

## 2016-08-28 MED ORDER — MIDAZOLAM HCL 2 MG/2ML IJ SOLN
INTRAMUSCULAR | Status: AC
  Filled 2016-08-28: qty 2

## 2016-08-28 MED ORDER — PROPOFOL 10 MG/ML IV BOLUS
INTRAVENOUS | Status: DC | PRN
  Administered 2016-08-28: 110 mg via INTRAVENOUS

## 2016-08-28 MED ORDER — FENTANYL CITRATE (PF) 100 MCG/2ML IJ SOLN
25.0000 ug | INTRAMUSCULAR | Status: DC | PRN
  Administered 2016-08-28 (×2): 50 ug via INTRAVENOUS

## 2016-08-28 MED ORDER — SUGAMMADEX SODIUM 200 MG/2ML IV SOLN
INTRAVENOUS | Status: DC | PRN
  Administered 2016-08-28: 175 mg via INTRAVENOUS

## 2016-08-28 MED ORDER — VANCOMYCIN HCL 500 MG IV SOLR
INTRAVENOUS | Status: AC
  Filled 2016-08-28: qty 500

## 2016-08-28 MED ORDER — GENTAMICIN SULFATE 40 MG/ML IJ SOLN
1000.0000 mg | INTRAMUSCULAR | Status: AC
  Filled 2016-08-28: qty 25

## 2016-08-28 MED ORDER — VANCOMYCIN HCL 500 MG IV SOLR
INTRAVENOUS | Status: DC | PRN
  Administered 2016-08-28: 500 mg

## 2016-08-28 MED ORDER — 0.9 % SODIUM CHLORIDE (POUR BTL) OPTIME
TOPICAL | Status: DC | PRN
  Administered 2016-08-28: 1000 mL

## 2016-08-28 MED ORDER — VANCOMYCIN HCL 1000 MG IV SOLR
INTRAVENOUS | Status: AC
Start: 2016-08-28 — End: 2016-08-28
  Filled 2016-08-28: qty 2000

## 2016-08-28 MED ORDER — SODIUM CHLORIDE 0.9 % IR SOLN
Status: DC | PRN
  Administered 2016-08-28: 3000 mL

## 2016-08-28 MED ORDER — ONDANSETRON HCL 4 MG/2ML IJ SOLN
INTRAMUSCULAR | Status: DC | PRN
  Administered 2016-08-28: 4 mg via INTRAVENOUS

## 2016-08-28 MED ORDER — FENTANYL CITRATE (PF) 100 MCG/2ML IJ SOLN
INTRAMUSCULAR | Status: AC
  Administered 2016-08-28: 50 ug via INTRAVENOUS
  Filled 2016-08-28: qty 2

## 2016-08-28 MED ORDER — FENTANYL CITRATE (PF) 100 MCG/2ML IJ SOLN
INTRAMUSCULAR | Status: DC | PRN
  Administered 2016-08-28 (×2): 50 ug via INTRAVENOUS
  Administered 2016-08-28: 100 ug via INTRAVENOUS

## 2016-08-28 MED ORDER — ROCURONIUM BROMIDE 100 MG/10ML IV SOLN
INTRAVENOUS | Status: DC | PRN
  Administered 2016-08-28: 60 mg via INTRAVENOUS

## 2016-08-28 MED ORDER — TOBRAMYCIN SULFATE 1.2 G IJ SOLR
INTRAMUSCULAR | Status: AC
  Filled 2016-08-28: qty 1.2

## 2016-08-28 MED ORDER — FENTANYL CITRATE (PF) 250 MCG/5ML IJ SOLN
INTRAMUSCULAR | Status: AC
  Filled 2016-08-28: qty 5

## 2016-08-28 MED ORDER — OXYCODONE HCL 5 MG/5ML PO SOLN: 5.0000 mg | Freq: Once | ORAL | Status: DC | PRN

## 2016-08-28 MED ORDER — SODIUM CHLORIDE 0.9 % IV SOLN: INTRAVENOUS | Status: DC

## 2016-08-28 MED ORDER — SODIUM CHLORIDE 0.9 % IV SOLN
INTRAVENOUS | Status: DC | PRN
  Administered 2016-08-28 (×2): via INTRAVENOUS

## 2016-08-28 SURGICAL SUPPLY — 93 items
AGENT HMST SPONGE THK3/8 (HEMOSTASIS)
BAG ISL DRAPE 18X18 STRL (DRAPES) ×2
BAG ISOLATION DRAPE 18X18 (DRAPES) IMPLANT
BANDAGE ACE 4X5 VEL STRL LF (GAUZE/BANDAGES/DRESSINGS) IMPLANT
BANDAGE ESMARK 6X9 LF (GAUZE/BANDAGES/DRESSINGS) IMPLANT
BNDG CMPR 9X6 STRL LF SNTH (GAUZE/BANDAGES/DRESSINGS)
BNDG ESMARK 6X9 LF (GAUZE/BANDAGES/DRESSINGS)
CANISTER SUCT 3000ML PPV (MISCELLANEOUS) ×4 IMPLANT
CLIP TI MEDIUM 24 (CLIP) ×4 IMPLANT
CLIP TI WIDE RED SMALL 24 (CLIP) ×4 IMPLANT
CLOSURE WOUND 1/2 X4 (GAUZE/BANDAGES/DRESSINGS)
COVER PROBE W GEL 5X96 (DRAPES) ×6 IMPLANT
COVER SURGICAL LIGHT HANDLE (MISCELLANEOUS) ×4 IMPLANT
CUFF TOURNIQUET SINGLE 24IN (TOURNIQUET CUFF) IMPLANT
CUFF TOURNIQUET SINGLE 34IN LL (TOURNIQUET CUFF) IMPLANT
CUFF TOURNIQUET SINGLE 44IN (TOURNIQUET CUFF) IMPLANT
DRAIN CHANNEL 15F RND FF W/TCR (WOUND CARE) IMPLANT
DRAIN SNY 10X20 3/4 PERF (WOUND CARE) IMPLANT
DRAPE C-ARM 42X72 X-RAY (DRAPES) ×4 IMPLANT
DRAPE HALF SHEET 40X57 (DRAPES) IMPLANT
DRAPE INCISE IOBAN 66X45 STRL (DRAPES) ×4 IMPLANT
DRAPE ISOLATION BAG 18X18 (DRAPES) ×2
DRAPE LAPAROSCOPIC ABDOMINAL (DRAPES) IMPLANT
DRAPE WARM FLUID 44X44 (DRAPE) ×4 IMPLANT
DRSG VAC ATS LRG SENSATRAC (GAUZE/BANDAGES/DRESSINGS) IMPLANT
DRSG VAC ATS MED SENSATRAC (GAUZE/BANDAGES/DRESSINGS) IMPLANT
DRSG VAC ATS SM SENSATRAC (GAUZE/BANDAGES/DRESSINGS) ×2 IMPLANT
ELECT BLADE 4.0 EZ CLEAN MEGAD (MISCELLANEOUS) ×4
ELECT BLADE 6.5 EXT (BLADE) IMPLANT
ELECT REM PT RETURN 9FT ADLT (ELECTROSURGICAL) ×4
ELECTRODE BLDE 4.0 EZ CLN MEGD (MISCELLANEOUS) ×2 IMPLANT
ELECTRODE REM PT RTRN 9FT ADLT (ELECTROSURGICAL) ×2 IMPLANT
EVACUATOR SILICONE 100CC (DRAIN) IMPLANT
FELT TEFLON 4 X1 (MISCELLANEOUS) ×4 IMPLANT
GAUZE SPONGE 4X4 12PLY STRL (GAUZE/BANDAGES/DRESSINGS) IMPLANT
GAUZE SPONGE 4X4 16PLY XRAY LF (GAUZE/BANDAGES/DRESSINGS) IMPLANT
GLOVE BIO SURGEON STRL SZ7 (GLOVE) ×6 IMPLANT
GLOVE BIOGEL PI IND STRL 7.5 (GLOVE) ×2 IMPLANT
GLOVE BIOGEL PI INDICATOR 7.5 (GLOVE) ×4
GLOVE ECLIPSE 6.5 STRL STRAW (GLOVE) ×4 IMPLANT
GLOVE ECLIPSE 7.0 STRL STRAW (GLOVE) ×2 IMPLANT
GLOVE INDICATOR 7.5 STRL GRN (GLOVE) ×2 IMPLANT
GOWN STRL REUS W/ TWL LRG LVL3 (GOWN DISPOSABLE) ×6 IMPLANT
GOWN STRL REUS W/TWL LRG LVL3 (GOWN DISPOSABLE) ×12
HANDPIECE INTERPULSE COAX TIP (DISPOSABLE) ×4
HEMOSTAT SPONGE AVITENE ULTRA (HEMOSTASIS) IMPLANT
INSERT FOGARTY 61MM (MISCELLANEOUS) ×4 IMPLANT
INSERT FOGARTY SM (MISCELLANEOUS) ×8 IMPLANT
KIT BASIN OR (CUSTOM PROCEDURE TRAY) ×4 IMPLANT
KIT ROOM TURNOVER OR (KITS) ×4 IMPLANT
KIT STIMULAN RAPID CURE  10CC (Orthopedic Implant) ×2 IMPLANT
KIT STIMULAN RAPID CURE 10CC (Orthopedic Implant) IMPLANT
MARKER GRAFT CORONARY BYPASS (MISCELLANEOUS) IMPLANT
NS IRRIG 1000ML POUR BTL (IV SOLUTION) ×8 IMPLANT
PACK AORTA (CUSTOM PROCEDURE TRAY) ×4 IMPLANT
PACK GENERAL/GYN (CUSTOM PROCEDURE TRAY) ×4 IMPLANT
PACK PERIPHERAL VASCULAR (CUSTOM PROCEDURE TRAY) ×4 IMPLANT
PACK UNIVERSAL I (CUSTOM PROCEDURE TRAY) ×2 IMPLANT
PAD ARMBOARD 7.5X6 YLW CONV (MISCELLANEOUS) ×8 IMPLANT
PAD NEG PRESSURE SENSATRAC (MISCELLANEOUS) ×6 IMPLANT
RETAINER VISCERA MED (MISCELLANEOUS) ×4 IMPLANT
SET HNDPC FAN SPRY TIP SCT (DISPOSABLE) IMPLANT
SET MICROPUNCTURE 5F STIFF (MISCELLANEOUS) IMPLANT
STAPLER VISISTAT 35W (STAPLE) ×8 IMPLANT
STOPCOCK 4 WAY LG BORE MALE ST (IV SETS) IMPLANT
STRIP CLOSURE SKIN 1/2X4 (GAUZE/BANDAGES/DRESSINGS) IMPLANT
SUT ETHIBOND 5 LR DA (SUTURE) IMPLANT
SUT ETHILON 3 0 PS 1 (SUTURE) IMPLANT
SUT GORETEX 5 0 TT13 24 (SUTURE) IMPLANT
SUT GORETEX 6.0 TT13 (SUTURE) IMPLANT
SUT MNCRL AB 4-0 PS2 18 (SUTURE) ×12 IMPLANT
SUT PDS AB 1 TP1 96 (SUTURE) ×8 IMPLANT
SUT PROLENE 3 0 SH1 36 (SUTURE) IMPLANT
SUT PROLENE 5 0 C 1 24 (SUTURE) ×12 IMPLANT
SUT PROLENE 5 0 C 1 36 (SUTURE) IMPLANT
SUT PROLENE 6 0 BV (SUTURE) ×4 IMPLANT
SUT PROLENE 7 0 BV 1 (SUTURE) IMPLANT
SUT SILK 2 0 FS (SUTURE) IMPLANT
SUT SILK 2 0 SH CR/8 (SUTURE) ×4 IMPLANT
SUT SILK 3 0 (SUTURE)
SUT SILK 3-0 18XBRD TIE 12 (SUTURE) IMPLANT
SUT VIC AB 2-0 CT1 27 (SUTURE) ×12
SUT VIC AB 2-0 CT1 36 (SUTURE) ×8 IMPLANT
SUT VIC AB 2-0 CT1 TAPERPNT 27 (SUTURE) ×6 IMPLANT
SUT VIC AB 3-0 SH 27 (SUTURE) ×12
SUT VIC AB 3-0 SH 27X BRD (SUTURE) ×6 IMPLANT
SWAB CULTURE LIQUID MINI MALE (MISCELLANEOUS) ×2 IMPLANT
TOWEL BLUE STERILE X RAY DET (MISCELLANEOUS) ×8 IMPLANT
TOWEL GREEN STERILE FF (TOWEL DISPOSABLE) ×2 IMPLANT
TRAY FOLEY W/METER SILVER 16FR (SET/KITS/TRAYS/PACK) ×4 IMPLANT
TUBING EXTENTION W/L.L. (IV SETS) IMPLANT
UNDERPAD 30X30 (UNDERPADS AND DIAPERS) ×4 IMPLANT
WATER STERILE IRR 1000ML POUR (IV SOLUTION) ×8 IMPLANT

## 2016-08-28 NOTE — Anesthesia Procedure Notes (Signed)
Procedure Name: Intubation Date/Time: 08/28/2016 9:46 AM Performed by: Dairl PonderJIANG, Chinita Schimpf Pre-anesthesia Checklist: Patient identified, Emergency Drugs available, Suction available, Patient being monitored and Timeout performed Patient Re-evaluated:Patient Re-evaluated prior to inductionOxygen Delivery Method: Circle system utilized Preoxygenation: Pre-oxygenation with 100% oxygen Intubation Type: IV induction Ventilation: Mask ventilation without difficulty and Oral airway inserted - appropriate to patient size Laryngoscope Size: Miller and 2 Grade View: Grade II Tube type: Oral Tube size: 7.0 mm Number of attempts: 1 Airway Equipment and Method: Stylet Placement Confirmation: ETT inserted through vocal cords under direct vision,  positive ETCO2 and breath sounds checked- equal and bilateral Secured at: 23 cm Tube secured with: Tape Dental Injury: Teeth and Oropharynx as per pre-operative assessment

## 2016-08-28 NOTE — Anesthesia Postprocedure Evaluation (Addendum)
Anesthesia Post Note  Patient: Robert [name]  Procedure(s) Performed: Procedure(s) (LRB): RIGHT GROIN EXPLORATION , IRRIGATION AND DEBRIDEMENT OF RIGHT GROIN FLUID COLLECTION,  Placement of Antibiotic Beads VAC PLACEMENT (Right) APPLICATION OF WOUND VAC (Right)  Patient location during evaluation: PACU Anesthesia Type: General Level of consciousness: awake and alert Pain management: pain level controlled Vital Signs Assessment: post-procedure vital signs reviewed and stable Respiratory status: spontaneous breathing, nonlabored ventilation, respiratory function stable and patient connected to nasal cannula oxygen Cardiovascular status: stable Postop Assessment: no signs of nausea or vomiting Anesthetic complications: no       Last Vitals:  Vitals:   08/28/16 1100 08/28/16 1130  BP: (!) 146/56   Pulse:  73  Resp:  15  Temp: 36.3 C     Last Pain:  Vitals:   08/28/16 1130  TempSrc:   PainSc: 5                  Kaison Mcparland

## 2016-08-28 NOTE — Anesthesia Preprocedure Evaluation (Signed)
Anesthesia Evaluation  Patient identified by MRN, date of birth, ID band Patient awake    Reviewed: Allergy & Precautions, NPO status , Patient's Chart, lab work & pertinent test results  Airway Mallampati: II  TM Distance: >3 FB Neck ROM: Full    Dental  (+) Teeth Intact   Pulmonary neg shortness of breath, neg sleep apnea, neg COPD, neg recent URI, former smoker,    breath sounds clear to auscultation       Cardiovascular hypertension, Pt. on medications (-) angina+ Peripheral Vascular Disease   Rhythm:Regular Rate:Normal     Neuro/Psych negative neurological ROS  negative psych ROS   GI/Hepatic negative GI ROS, Neg liver ROS,   Endo/Other  Morbid obesity  Renal/GU Renal InsufficiencyRenal diseaseHistory noted. CE     Musculoskeletal   Abdominal   Peds  Hematology  (+) anemia ,   Anesthesia Other Findings   Reproductive/Obstetrics                             Anesthesia Physical Anesthesia Plan  ASA: III  Anesthesia Plan: General   Post-op Pain Management:    Induction: Intravenous  Airway Management Planned: Oral ETT  Additional Equipment: None  Intra-op Plan:   Post-operative Plan: Extubation in OR  Informed Consent: I have reviewed the patients History and Physical, chart, labs and discussed the procedure including the risks, benefits and alternatives for the proposed anesthesia with the patient or authorized representative who has indicated his/her understanding and acceptance.   Dental advisory given  Plan Discussed with: CRNA and Surgeon  Anesthesia Plan Comments:         Anesthesia Quick Evaluation

## 2016-08-28 NOTE — Transfer of Care (Signed)
Immediate Anesthesia Transfer of Care Note  Patient: Robert DeutscherRobert [name]  Procedure(s) Performed: Procedure(s): RIGHT GROIN EXPLORATION , IRRIGATION AND DEBRIDEMENT OF RIGHT GROIN FLUID COLLECTION, POSSIBLE REMOVAL OF RIGHT  AORTOFEMORAL GRAFT, POSSIBLE  RIGHT AXILLO-POPITEAL BYPASS , & VAC PLACEMENT (Right) BYPASS GRAFT FEMORAL-POPLITEAL ARTERY (Right) BYPASS GRAFT AORTA TO AORTA (Right) APPLICATION OF WOUND VAC (Right)  Patient Location: PACU  Anesthesia Type:General  Level of Consciousness: awake, alert  and oriented  Airway & Oxygen Therapy: Patient Spontanous Breathing and Patient connected to nasal cannula oxygen  Post-op Assessment: Report given to RN and Post -op Vital signs reviewed and stable  Post vital signs: Reviewed and stable  Last Vitals:  Vitals:   08/28/16 0410 08/28/16 1100  BP: (!) 135/59   Pulse: 82   Resp: 17   Temp: 37.2 C 36.3 C    Last Pain:  Vitals:   08/28/16 0410  TempSrc: Oral         Complications: No apparent anesthesia complications

## 2016-08-28 NOTE — Interval H&P Note (Signed)
   History and Physical Update   The patient was interviewed and re-examined.  The patient's previous History and Physical has been reviewed and is unchanged from my NP's consult.  Plan: right groin exploration, incision and drainage of right groin fluid collection, possible placement of antibiotic beads, possible resection of right aortobifemoral limb, possible right femoropopliteal bypass, and other indicated procedures.  -  The patient is aware the risks include but are not limited to: bleeding, infection, myocardial infarction, stroke, limb loss, nerve damage, need for additional procedures in the future, wound complications, limited patency of such bypasses and inability to complete the bypass.    Leonides SakeBrian Chen, MD, FACS [facility] [place] Office: [phone] Pager: [phone]  08/28/2016, 8:14 AM

## 2016-08-28 NOTE — H&P (View-Only) (Signed)
[facility] [place]   CC: Follow up peripheral artery occlusive disease  History of Present Illness Robert [name] is a 66 y.o. male who is s/p ABF bypass graft complicated by ARF sec. ARB/hypotension, UHR, L iliofem BPA (Date: 05/21/15) by Dr. Imogene [name].   He returns today at request of Dr. Foy [name]. Dr. Foy [name] spoke with [facility] triage nurse, said that there is an area in his left groin that is sore and somewhat red. There is no drainage and Robert [name] fever is back up to 101.2. He is going to start him on some Augmentin today (yesterday), Rocephin 1 gm given IM in [place] walk in in clinic yesterday; he asked that we see him tomorrow because he can't rule out if this is just superficial or is communicating with the graft.  Dr. Imogene [name] last evaluated pt on 09-12-15. At that time both groins were healed and feet were warm with faintly palpable pulses in feet. Dr. Imogene [name] advised follow up in one year of aortoiliac duplex and BLE ABI.  Pt denies any claudication symptoms in his legs with walking, denies any history of stroke or TIA.   Pt Diabetic: pre diabetes Pt smoker: former smoker  Pt meds include: Statin :Yes Betablocker: No ASA: Yes Other anticoagulants/antiplatelets: no  Past Medical History:  Diagnosis Date  . Cataracts, bilateral    immature  . Chronic back pain    ruptured disc  . Chronic kidney disease    Stage III  Moderate  . Glaucoma    uses eye drops nightly  . History of bronchitis as a child   . Hyperlipidemia    takes Tricor and Vytorin daily  . Hypertension    takes Diovan HCT daily  . Hypertension 04/10/2015  . Nocturia   . Peripheral vascular disease [facility])     Social History Social History  Substance Use Topics  . Smoking status: Former Games developer  . Smokeless tobacco: Never Used     Comment: quit smoking 11 yrs ago  . Alcohol use 0.0 oz/week     Comment: couple of beers a day    Family History Family History  Problem Relation Age of Onset  .  Hyperlipidemia Father   . Peripheral vascular disease Father        amputation  . Cancer Brother        colon/bowel cancer  . Hyperlipidemia Brother   . Cancer Mother   . Liver disease Mother   . Cancer Maternal Grandmother   . Heart disease Paternal Grandmother   . Diabetes Paternal Grandmother     Past Surgical History:  Procedure Laterality Date  . AORTA - BILATERAL FEMORAL ARTERY BYPASS GRAFT N/A 05/21/2015   Procedure: AORTOBIFEMORAL BYPASS GRAFT USING HEMASHIELD GOLD 14X8MM X 40CM VASCULAR GRAFT;  Surgeon: Fransisco Hertz, MD;  Location: [facility] OR;  Service: Vascular;  Laterality: N/A;  . ENDARTERECTOMY FEMORAL Bilateral 05/21/2015   Procedure: ENDARTERECTOMY BILATERAL FEMORAL ARTERIES;  Surgeon: Fransisco Hertz, MD;  Location: [facility] OR;  Service: Vascular;  Laterality: Bilateral;  . INSERTION OF DIALYSIS CATHETER N/A 05/23/2015   Procedure: INSERTION OF DIALYSIS CATHETER left internal jugular;  Surgeon: Chuck Hint, MD;  Location: [facility] OR;  Service: Vascular;  Laterality: N/A;  . PATCH ANGIOPLASTY Left 05/21/2015   Procedure: LEFT FEMORAL ARTERY PATCH ANGIOPLASTY USING Livia Snellen BIOLOGIC PATCH;  Surgeon: Fransisco Hertz, MD;  Location: [facility] OR;  Service: Vascular;  Laterality: Left;  . PERIPHERAL VASCULAR CATHETERIZATION N/A 03/13/2015   Procedure:  Abdominal Aortogram;  Surgeon: Fransisco HertzBrian L Chen, MD;  [facility];  Service: Cardiovascular;  Laterality: N/A;    Allergies  Allergen Reactions  . Valsartan-Hydrochlorothiazide Other (See Comments)    Renal failure    Current Outpatient Prescriptions  Medication Sig Dispense Refill  . aspirin 81 MG tablet Take 81 mg by mouth daily.    . ciclopirox (LOPROX) 0.77 % cream Apply 1 application topically 2 (two) times daily as needed. Gel    . diphenhydrAMINE (BENADRYL) 25 mg capsule Take 25 mg by mouth at bedtime.    Marland Kitchen. ezetimibe-simvastatin (VYTORIN) 10-40 MG per tablet Take 1 tablet by mouth daily.    . fenofibrate (TRICOR) 145 MG  tablet Take 145 mg by mouth daily.    . hydrochlorothiazide (HYDRODIURIL) 25 MG tablet Take 25 mg by mouth every morning.  11  . traMADol (ULTRAM) 50 MG tablet Take 1 tablet (50 mg total) by mouth every 6 (six) hours as needed for moderate pain. For PAIN   PRN 30 tablet 0   No current facility-administered medications for this visit.     ROS: See HPI for pertinent positives and negatives.   Physical Examination  Vitals:   08/27/16 1008 08/27/16 1010  BP: (!) 149/73 (!) 146/72  Pulse: 84 92  Resp: 16 16  Temp:  99.1 F (37.3 C)  TempSrc:  Oral  SpO2: 96% 98%  Weight: 194 lb (88 kg) 194 lb (88 kg)  Height: 5\' 6"  (1.676 m) 5\' 6"  (1.676 m)   Body mass index is 31.31 kg/m.  General: A&O x 3, WDWN obese male. Gait: normal Eyes: PERRLA. Pulmonary: Respirations are non labored, CTAB, good air movement Cardiac: regular Rhythm, no detected murmur.         Carotid Bruits Right Left   Negative Negative  Aorta is not palpable. Radial pulses: palpable bilaterally                           VASCULAR EXAM: Extremities without ischemic changes, without Gangrene; without open wounds.                                                                                                          LE Pulses Right Left       FEMORAL  3+ palpable  3+ palpable        POPLITEAL  1+ palpable   1+ palpable       POSTERIOR TIBIAL  2+ palpable   2+ palpable        DORSALIS PEDIS      ANTERIOR TIBIAL faintly palpable  2+ palpable    Abdomen: soft, NT; erythema, moderate swelling, and mild tenderness at right groin. Swelling and erythema extend into pubis Skin: no rashes, no ulcers noted. Musculoskeletal: no muscle wasting or atrophy.  Neurologic: A&O X 3; Appropriate Affect ; SENSATION: normal; MOTOR FUNCTION:  moving all extremities equally, motor strength 5/5 throughout. Speech is fluent/normal. CN 2-12 intact.     ASSESSMENT: Robert [name] is  a 66 y.o. male who presents with pain,  swelling, and erythema right groin and pubis for 3 days, with fever. He is s/p aortobifemoral bypass graft on 05/21/15. WBC was 17 yesterday from his PCP's office.  Dr. Imogene [name] spoke with and examined pt.  PLAN:  Admit to 2W, Dr. Imogene [name], possible infected aortobifemoral bypass graft. CTA abd/pelvis. Antibiotics to be managed by pharmacy.  Preoperative labs to be drawn.    Charisse March, RN, MSN, FNP-C [facility] [place] Phone: [phone]  Clinic MD: Robert [name]  08/27/16 11:03 AM

## 2016-08-28 NOTE — Op Note (Signed)
    OPERATIVE NOTE   PROCEDURE: 1. Incision and drainage of right groin abscess 2. Placement of antibiotic impregnated bead in abscess cavity 3. Negative pressure dressing  PRE-OPERATIVE DIAGNOSIS: right groin fluid collection, prior aortobifemoral graft  POST-OPERATIVE DIAGNOSIS: same as above   SURGEON: Leonides SakeBrian Chen, MD  ASSISTANT(S): Lianne CureMaureen Collins, PAC   ANESTHESIA: general  ESTIMATED BLOOD LOSS: 50 cc  FINDING(S): 1.  Densely scarred tissue superficial to the abscess cavity 2.  ~100 cc of pus adjacent to right aortobifemoral limb 3.  No evidence of compromise of right aortofemoral anastomosis  SPECIMEN(S):  Anaerobic and aerobic cultures of right groin abscess  INDICATIONS:   Robert [name] is a 66 y.o. male who presents with low grade fever and increasing mass in right groin in setting of prior aortobifemoral bypass.  On CT there appeared to be a fluid collection around the right aortofemoral limb.  I recommended:  right groin exploration, incision and drainage of right groin fluid collection, possible excision of right aortofemoral graft, possible right axillopopliteal bypass, possible placement of antibiotic beads, and possible VAC placement  The patient is aware the risks include but are not limited to: bleeding, infection, myocardial infarction, stroke, limb loss, nerve damage, need for additional procedures in the future, wound complications, limited patency of such bypasses and inability to complete the bypass. The patient is aware of these risks and agreed to proceed.  DESCRIPTION: After obtaining full informed written consent, the patient was brought back to the operating room and placed supine upon the operating table.  The patient received IV antibiotics prior to induction.  A procedure time out was completed and the correct surgical site was verified.  After obtaining adequate anesthesia, the patient was prepped and draped in the standard fashion for: right  axillopopliteal bypass and right retroperitoneal bypass.    Under Sonosite, I identified a fluid cavity deep in the groin.  I made an incision through the prior incision.  I dissected through dense scar tissue with electrocautery.  I had to dissect 3-4 cm deep to reach the abscess cavity.  The tissue superficial was remarkably benign appearing with chronic dense scar tissue.  I finally got into the abscess cavity.  There was frank pus in this cavity.  I took anaerobic and aerobic cultures.  I evacuated ~100 cc of pus from the abscess cavity.    I washed and mechanically debrided this abscess cavity with 3 L of normal saline using a Pulsavac suction-irrigator.  At the end of this process, I could clearly see the right aortofemoral graft densely scarred into adjacent tissue.  The suture line remained intact throughout.    At this point, previously prepared Stimulan antibiotic beads impregnated with 500 mg of Vancomycin and 240 mg were examined.  I placed 20 of these beads in the abscess cavity.  I then reapproximated the subcutaneous tissue immediately above the aortobifemoral limb.    I fashioned a small VAC sponge for the dimensions of the surgical incision.  I affixed this sponge with adhesive strips.  A hole was cut in the middle of the adhesive strips and the lilypad attached.  The VAC tubing was connected to the VAC pump at 125 continuous.  The VAC dressing became adherent without an air leak.   COMPLICATIONS: none  CONDITION: stable   Leonides SakeBrian Chen, MD, [facility] [place] Office: [phone] Pager: [phone]  08/28/2016, 10:53 AM

## 2016-08-29 NOTE — Progress Notes (Addendum)
     Subjective  - Doing well this am.   Objective (!) 136/55 71 97.8 F (36.6 C) (Oral) 18 97%  Intake/Output Summary (Last 24 hours) at 08/29/16 0736 Last data filed at 08/28/16 1700  Gross per 24 hour  Intake             1160 ml  Output              925 ml  Net              235 ml   Right groin with decreased erythema, wound vac to suction. Distally right foot warm, active range of motion intact, sensation intac.   Assessment/Planning: POD # 1  PROCEDURE: 1. Incision and drainage of right groin abscess 2. Placement of antibiotic impregnated bead in abscess cavity 3. Negative pressure dressing  WBC decreased 14.7, IV antibiotics Vanco and Zosyn  Pending cultures MODERATE WBC PRESENT, PREDOMINANTLY PMN  FEW GRAM POSITIVE COCCI IN CHAINS  Plan to consult ID Mon 08/30/2016 Wound vac change Mon with Dr. Amanda [name]  [name], Robert [facility] Hospital[facility]EN 08/29/2016 7:36 AM   Addendum  I have independently interviewed and examined the patient, and I agree with the physician assistant's findings.  BC x 2: NGTD, Prelim: cx of R groin fluid: few GPC in chains  - Pt on Zosyn/Vanco per Pharmacy mgmt - Vanc/Gent antibiotic beads in abscess cavity, VAC to superficial tissue - ID c/s tomorrow  - No bleeding at this point  Leonides SakeBrian Chen, MD, FACS [facility] [place] Office: [phone] Pager: [phone]  08/29/2016, 9:52 AM  --  Laboratory Lab Results:  Recent Labs  08/27/16 1554 08/28/16 0239  WBC 18.7* 14.7*  HGB 11.8* 11.1*  HCT 36.2* 33.8*  PLT 190 203   BMET  Recent Labs  08/27/16 1554 08/28/16 0239  NA 135 135  K 3.5 3.5  CL 99* 100*  CO2 24 26  GLUCOSE 141* 107*  BUN 15 14  CREATININE 1.59* 1.63*  CALCIUM 9.4 8.9    COAG Lab Results  Component Value Date   INR 1.22 08/27/2016   INR 1.18 05/21/2015   INR 1.08 05/09/2015   No results found for: PTT

## 2016-08-30 ENCOUNTER — Encounter (HOSPITAL_COMMUNITY): Payer: Self-pay | Admitting: Vascular Surgery

## 2016-08-30 LAB — BASIC METABOLIC PANEL
ANION GAP: 8 (ref 5–15)
BUN: 18 mg/dL (ref 6–20)
CALCIUM: 9 mg/dL (ref 8.9–10.3)
CO2: 27 mmol/L (ref 22–32)
CREATININE: 1.53 mg/dL — AB (ref 0.61–1.24)
Chloride: 102 mmol/L (ref 101–111)
GFR calc Af Amer: 54 mL/min — ABNORMAL LOW (ref >60)
GFR calc non Af Amer: 46 mL/min — ABNORMAL LOW (ref >60)
GLUCOSE: 143 mg/dL — AB (ref 65–99)
Potassium: 3.9 mmol/L (ref 3.5–5.1)
Sodium: 137 mmol/L (ref 135–145)

## 2016-08-30 LAB — CBC
HEMATOCRIT: 34.1 % — AB (ref 39.0–52.0)
HEMOGLOBIN: 11.2 g/dL — AB (ref 13.0–17.0)
MCH: 30 pg (ref 26.0–34.0)
MCHC: 32.8 g/dL (ref 30.0–36.0)
MCV: 91.4 fL (ref 78.0–100.0)
Platelets: 270 10*3/uL (ref 150–400)
RBC: 3.73 MIL/uL — ABNORMAL LOW (ref 4.22–5.81)
RDW: 12.9 % (ref 11.5–15.5)
WBC: 7.2 10*3/uL (ref 4.0–10.5)

## 2016-08-30 LAB — C-REACTIVE PROTEIN: CRP: 6.9 mg/dL — ABNORMAL HIGH (ref <1.0)

## 2016-08-30 LAB — SEDIMENTATION RATE: Sed Rate: 70 mm/hr — ABNORMAL HIGH (ref 0–16)

## 2016-08-30 MED ORDER — CEFAZOLIN SODIUM-DEXTROSE 2-4 GM/100ML-% IV SOLN
2.0000 g | Freq: Three times a day (TID) | INTRAVENOUS | Status: DC
  Filled 2016-08-30 (×2): qty 100

## 2016-08-30 MED ORDER — DEXTROSE 5 % IV SOLN
9.0000 10*6.[IU] | Freq: Two times a day (BID) | INTRAVENOUS | Status: DC
  Administered 2016-08-30 – 2016-09-08 (×17): 9 10*6.[IU] via INTRAVENOUS
  Filled 2016-08-30 (×22): qty 9

## 2016-08-30 MED ORDER — MUPIROCIN 2 % EX OINT
1.0000 "application " | TOPICAL_OINTMENT | Freq: Two times a day (BID) | CUTANEOUS | Status: AC
  Administered 2016-08-30 – 2016-09-04 (×9): 1 via NASAL
  Filled 2016-08-30 (×2): qty 22

## 2016-08-30 MED ORDER — CHLORHEXIDINE GLUCONATE CLOTH 2 % EX PADS
6.0000 | MEDICATED_PAD | Freq: Every day | CUTANEOUS | Status: AC
  Administered 2016-08-30 – 2016-09-02 (×4): 6 via TOPICAL

## 2016-08-30 MED FILL — Gentamicin Sulfate Inj 40 MG/ML: INTRAMUSCULAR | Qty: 240 | Status: AC

## 2016-08-30 NOTE — Consult Note (Signed)
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 male that presented to vascular surgery team with low grade fevers (100 - 101) and right swelling/erythema at a previous aortobifemoral bypass graft site. With original surgery required [facility] therapy for wound dehiscence of right groin for about 6 weeks per patient. Reports they attempted wet-to-dry dressing changes first but were not effective.   He reports that he has always had some transient pain of this site but it resolves normally. Noticed worsening pain, swelling and erythema of the groin with headache, fever and decreased appetite last tuesday. He notified vascular surgery and sought care with PCP initially - he was started on Augmentin 5/17, received Ceftriaxone 1 gm x 1 outpatient at [facility] walk in clinic 5/17 and was admitted 5/18 for evaluation of graft infection and started on vancomycin/zosyn. Presented to hospital with leukocytosis (WBC 18).   On 5/19 he underwent right exploration I&D of right groin of infected ABF bypass graft by Dr. Bridgett Larsson; ~100cc pus was evacuated from abscess and sent for culture. Vancomycin beads were inserted and VAC sponge placed in surgical incision.   BCx 5/18 >> 2/2 NGTD (received abx prior to) R Wound Cx 5/19 >> GBS, sens pending Nasal PCR >> MSSA   Past Medical History:  Diagnosis Date  . Cataracts, bilateral    immature  . Chronic back pain    ruptured disc  . Chronic kidney disease    Stage III  Moderate  . Glaucoma    uses eye drops nightly  . History of bronchitis as a child   . Hyperlipidemia    takes Tricor and Vytorin daily  . Hypertension    takes Diovan HCT daily  . Hypertension 04/10/2015  . Nocturia   .  Peripheral vascular disease ([place])     Allergies:  Allergies  Allergen Reactions  . Valsartan-Hydrochlorothiazide Other (See Comments)    Renal failure    Current antibiotics: Anti-infectives    Start     Dose/Rate Route Frequency Ordered Stop   08/30/16 1030  penicillin G potassium 9 Million Units in dextrose 5 % 500 mL continuous infusion     9 Million Units 41.7 mL/hr over 12 Hours Intravenous Every 12 hours 08/30/16 1024     08/30/16 0845  ceFAZolin (ANCEF) IVPB 2g/100 mL premix  Status:  Discontinued     2 g 200 mL/hr over 30 Minutes Intravenous Every 8 hours 08/30/16 0843 08/30/16 1024   08/28/16 1056  vancomycin (VANCOCIN) powder  Status:  Discontinued       As needed 08/28/16 1056 08/28/16 1103   08/28/16 0745  gentamicin (GARAMYCIN) injection 1,000 mg     1,000 mg Other To Surgery 08/28/16 0740 08/29/16 0745   08/28/16 0400  vancomycin (VANCOCIN) IVPB 750 mg/150 ml premix  Status:  Discontinued     750 mg 150 mL/hr over 60 Minutes Intravenous Every 12 hours 08/27/16 1736 08/30/16 0843   08/27/16 1400  piperacillin-tazobactam (ZOSYN) IVPB 3.375 g  Status:  Discontinued     3.375 g 12.5 mL/hr over 240 Minutes Intravenous Every 8 hours 08/27/16 1314 08/30/16 0843   08/27/16  1315  vancomycin (VANCOCIN) 1,500 mg in sodium chloride 0.9 % 500 mL IVPB     1,500 mg 250 mL/hr over 120 Minutes Intravenous  Once 08/27/16 1314 08/27/16 1816        MEDICATIONS: . aspirin EC  81 mg Oral Daily  . diphenhydrAMINE  25 mg Oral QHS  . ezetimibe-simvastatin  1 tablet Oral Daily  . fenofibrate  160 mg Oral Daily  . heparin  5,000 Units Subcutaneous Q8H  . hydrochlorothiazide  25 mg Oral q morning - 10a  . LORazepam  1 mg Oral Once  . pantoprazole  40 mg Oral Daily    Social History  Substance Use Topics  . Smoking status: Former Research scientist (life sciences)  . Smokeless tobacco: Never Used     Comment: quit smoking 11 yrs ago  . Alcohol use 1.8 oz/week    3 Cans of beer per week     Comment:  couple of beers a day    Family History  Problem Relation Age of Onset  . Hyperlipidemia Father   . Peripheral vascular disease Father        amputation  . Cancer Brother        colon/bowel cancer  . Hyperlipidemia Brother   . Cancer Mother   . Liver disease Mother   . Cancer Maternal Grandmother   . Heart disease Paternal Grandmother   . Diabetes Paternal Grandmother     Review of Systems - History obtained from chart review and the patient General ROS: positive for fevers, fatigue and decreased appetite Respiratory ROS: no cough, shortness of breath, or wheezing Cardiovascular ROS: no chest pain or dyspnea on exertion Gastrointestinal ROS: no abdominal pain, change in bowel habits, or black or bloody stools Genito-Urinary ROS: no dysuria, trouble voiding, or hematuria Musculoskeletal ROS: negative Neurological ROS: positive for headache (improved now). Negative for stroke symptoms Dermatological ROS: swelling and erythema to right groin at previous surgical incision   OBJECTIVE: Temp:  [97.8 F (36.6 C)-98.6 F (37 C)] 97.8 F (36.6 C) (05/21 0500) Pulse Rate:  [68-77] 72 (05/21 0500) Resp:  [18-20] 18 (05/21 0500) BP: (134-152)/(53-64) 137/57 (05/21 0500) SpO2:  [98 %-100 %] 98 % (05/21 0500)   General appearance: alert, cooperative and no distress Resp: clear to auscultation bilaterally Cardio: regular rate and rhythm and S1, S2 normal Pulses: 2+ and symmetric Neurologic: Alert and oriented X 3, normal strength and tone. Normal symmetric reflexes. Normal coordination and gait Incision/Wound: Right groin - VAC sponge intact and therapy with thin serosanguinous drainage in canister. Swelling noted, not tender.   LABS: Results for orders placed or performed during the hospital encounter of 08/27/16 (from the past 48 hour(s))  Basic metabolic panel     Status: Abnormal   Collection Time: 08/30/16  2:38 AM  Result Value Ref Range   Sodium 137 135 - 145 mmol/L    Potassium 3.9 3.5 - 5.1 mmol/L   Chloride 102 101 - 111 mmol/L   CO2 27 22 - 32 mmol/L   Glucose, Bld 143 (H) 65 - 99 mg/dL   BUN 18 6 - 20 mg/dL   Creatinine, Ser 1.53 (H) 0.61 - 1.24 mg/dL   Calcium 9.0 8.9 - 10.3 mg/dL   GFR calc non Af Amer 46 (L) >60 mL/min   GFR calc Af Amer 54 (L) >60 mL/min    Comment: (NOTE) The eGFR has been calculated using the CKD EPI equation. This calculation has not been validated in all clinical situations. eGFR's persistently <  60 mL/min signify possible Chronic Kidney Disease.    Anion gap 8 5 - 15  C-reactive protein     Status: Abnormal   Collection Time: 08/30/16 10:47 AM  Result Value Ref Range   CRP 6.9 (H) <1.0 mg/dL  CBC     Status: Abnormal   Collection Time: 08/30/16 10:47 AM  Result Value Ref Range   WBC 7.2 4.0 - 10.5 K/uL   RBC 3.73 (L) 4.22 - 5.81 MIL/uL   Hemoglobin 11.2 (L) 13.0 - 17.0 g/dL   HCT 34.1 (L) 39.0 - 52.0 %   MCV 91.4 78.0 - 100.0 fL   MCH 30.0 26.0 - 34.0 pg   MCHC 32.8 30.0 - 36.0 g/dL   RDW 12.9 11.5 - 15.5 %   Platelets 270 150 - 400 K/uL    MICRO: Results for orders placed or performed during the hospital encounter of 08/27/16  Culture, blood (Routine X 2) w Reflex to ID Panel     Status: None (Preliminary result)   Collection Time: 08/27/16  3:54 PM  Result Value Ref Range Status   Specimen Description BLOOD RIGHT ANTECUBITAL  Final   Special Requests   Final    BOTTLES DRAWN AEROBIC AND ANAEROBIC Blood Culture adequate volume   Culture NO GROWTH 3 DAYS  Final   Report Status PENDING  Incomplete  Culture, blood (Routine X 2) w Reflex to ID Panel     Status: None (Preliminary result)   Collection Time: 08/27/16  3:54 PM  Result Value Ref Range Status   Specimen Description BLOOD LEFT ANTECUBITAL  Final   Special Requests   Final    BOTTLES DRAWN AEROBIC AND ANAEROBIC Blood Culture adequate volume   Culture NO GROWTH 3 DAYS  Final   Report Status PENDING  Incomplete  Surgical pcr screen     Status:  Abnormal   Collection Time: 08/28/16 12:47 AM  Result Value Ref Range Status   MRSA, PCR NEGATIVE NEGATIVE Final   Staphylococcus aureus POSITIVE (A) NEGATIVE Final    Comment:        The Xpert SA Assay (FDA approved for NASAL specimens in patients over 33 years of age), is one component of a comprehensive surveillance program.  Test performance has been validated by [facility] for patients greater than or equal to 65 year old. It is not intended to diagnose infection nor to guide or monitor treatment.   Aerobic/Anaerobic Culture (surgical/deep wound)     Status: None (Preliminary result)   Collection Time: 08/28/16 11:03 AM  Result Value Ref Range Status   Specimen Description WOUND RIGHT GROIN  Final   Special Requests POF ZOSYN  Final   Gram Stain   Final    MODERATE WBC PRESENT, PREDOMINANTLY PMN FEW GRAM POSITIVE COCCI IN CHAINS    Culture   Final    FEW GROUP B STREP(S.AGALACTIAE)ISOLATED SUSCEPTIBILITIES TO FOLLOW NO ANAEROBES ISOLATED; CULTURE IN PROGRESS FOR 5 DAYS    Report Status PENDING  Incomplete     IMAGING: No results found.   Assessment/Plan:   1. 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  -Check ESR/CRP and CBC today to establish baseline markers and f/u on leukocytosis.  -Will need to have PICC placed for OPAT (IV and OPAT consults placed).  2. CKD  Janene Madeira, MSN, NP-C [facility] for [facility] [place] Cell: [phone] Pager: [phone]  08/30/2016 12:28 PM

## 2016-08-30 NOTE — Progress Notes (Signed)
Spoke with primary RN regarding PICC insertion.    Pt has a HDC and is being followed by the renal MDs.   Explained we would need permission from the renal MD for a PICC to be inserted.   They usually prefer pt go to IR for line placement.    Primary RN to notify MD.

## 2016-08-30 NOTE — Progress Notes (Addendum)
Daily Progress Note   Assessment/Planning:   POD #2 s/p R groin I&D, placement of abx beads (Vanco/Gent) for possible R aortofemoral BPG infection  Abx: IV - Vanco/Zosyn, Local - Vanco/Gent   Essential resolution of right groin cellulitis  Wound cx is growing Steptococcus agalactiae  Will ask for susceptibilities to be done given the potential severity of problem (contacted microbiology already)  Suspect this pt developed an acute infection of chronic seroma surrounding right aortofemoral graft  So far blood cultures have NOT demonstrated any bacteremia  Pt only started having sx on Tuesday so hopefully can sterilize wound with antibiotic beads and sterilize rest of blood stream with antibiotics  Will get ID's assistance with abx regimen and long-term suppression regimen  Wound looks good today  Plan for now is to continue VAC changes through the next week while wound C&S give a final result  Probably will plan on one final washout next Tuesday.  Depending on findings might get Plastics' involvement for space filling flap.  Check WBC tomorrow   Subjective  - 2 Days Post-Op   No complaints, feeling better   Objective   Vitals:   08/29/16 0454 08/29/16 1333 08/29/16 2153 08/30/16 0500  BP: (!) 136/55 134/64 (!) 152/53 (!) 137/57  Pulse: 71 77 68 72  Resp: 18 20 18 18   Temp: 97.8 F (36.6 C) 98.6 F (37 C) 98.4 F (36.9 C) 97.8 F (36.6 C)  TempSrc: Oral Oral Oral Oral  SpO2: 97% 98% 100% 98%  Weight:      Height:         Intake/Output Summary (Last 24 hours) at 08/30/16 0752 Last data filed at 08/29/16 2100  Gross per 24 hour  Intake              960 ml  Output                0 ml  Net              960 ml    PULM  CTAB  CV  RRR  GI  soft, NTND  VASC Perineal erythema resolved along with TTP, R groin: +granulation, +serous drainage at base of wound, no graft exposed  NEURO CN grossly intact, A&O x 3, Motor intact    Antibiotics    Anti-infectives    Start     Dose/Rate Route Frequency Ordered Stop   08/28/16 1056  vancomycin (VANCOCIN) powder  Status:  Discontinued       As needed 08/28/16 1056 08/28/16 1103   08/28/16 0745  gentamicin (GARAMYCIN) injection 1,000 mg     1,000 mg Other To Surgery 08/28/16 0740 08/29/16 0745   08/28/16 0400  vancomycin (VANCOCIN) IVPB 750 mg/150 ml premix     750 mg 150 mL/hr over 60 Minutes Intravenous Every 12 hours 08/27/16 1736     08/27/16 1400  piperacillin-tazobactam (ZOSYN) IVPB 3.375 g     3.375 g 12.5 mL/hr over 240 Minutes Intravenous Every 8 hours 08/27/16 1314     08/27/16 1315  vancomycin (VANCOCIN) 1,500 mg in sodium chloride 0.9 % 500 mL IVPB     1,500 mg 250 mL/hr over 120 Minutes Intravenous  Once 08/27/16 1314 08/27/16 1816      Laboratory   CBC CBC Latest Ref Rng & Units 08/28/2016 08/27/2016 05/26/2015  WBC 4.0 - 10.5 K/uL 14.7(H) 18.7(H) 10.4  Hemoglobin 13.0 - 17.0 g/dL 11.1(L) 11.8(L) 8.8(L)  Hematocrit 39.0 - 52.0 % 33.8(L) 36.2(L) 26.9(L)  Platelets 150 - 400 K/uL 203 190 225    BMET    Component Value Date/Time   NA 137 08/30/2016 0238   K 3.9 08/30/2016 0238   CL 102 08/30/2016 0238   CO2 27 08/30/2016 0238   GLUCOSE 143 (H) 08/30/2016 0238   BUN 18 08/30/2016 0238   CREATININE 1.53 (H) 08/30/2016 0238   CALCIUM 9.0 08/30/2016 0238   GFRNONAA 46 (L) 08/30/2016 0238   GFRAA 54 (L) 08/30/2016 0238    MICROBIOLOGY Results for orders placed or performed during the hospital encounter of 08/27/16  Culture, blood (Routine X 2) w Reflex to ID Panel     Status: None (Preliminary result)   Collection Time: 08/27/16  3:54 PM  Result Value Ref Range Status   Specimen Description BLOOD RIGHT ANTECUBITAL  Final   Special Requests   Final    BOTTLES DRAWN AEROBIC AND ANAEROBIC Blood Culture adequate volume   Culture NO GROWTH 2 DAYS  Final   Report Status PENDING  Incomplete  Culture, blood (Routine X 2) w Reflex to ID Panel     Status: None  (Preliminary result)   Collection Time: 08/27/16  3:54 PM  Result Value Ref Range Status   Specimen Description BLOOD LEFT ANTECUBITAL  Final   Special Requests   Final    BOTTLES DRAWN AEROBIC AND ANAEROBIC Blood Culture adequate volume   Culture NO GROWTH 2 DAYS  Final   Report Status PENDING  Incomplete  Surgical pcr screen     Status: Abnormal   Collection Time: 08/28/16 12:47 AM  Result Value Ref Range Status   MRSA, PCR NEGATIVE NEGATIVE Final   Staphylococcus aureus POSITIVE (A) NEGATIVE Final    Comment:        The Xpert SA Assay (FDA approved for NASAL specimens in patients over 34 years of age), is one component of a comprehensive surveillance program.  Test performance has been validated by [facility] for patients greater than or equal to 66 year old. It is not intended to diagnose infection nor to guide or monitor treatment.   Aerobic/Anaerobic Culture (surgical/deep wound)     Status: None (Preliminary result)   Collection Time: 08/28/16 11:03 AM  Result Value Ref Range Status   Specimen Description WOUND RIGHT GROIN  Final   Special Requests POF ZOSYN  Final   Gram Stain   Final    MODERATE WBC PRESENT, PREDOMINANTLY PMN FEW GRAM POSITIVE COCCI IN CHAINS    Culture   Final    FEW GROUP B STREP(S.AGALACTIAE)ISOLATED TESTING AGAINST S. AGALACTIAE NOT ROUTINELY PERFORMED DUE TO PREDICTABILITY OF AMP/PEN/VAN SUSCEPTIBILITY.    Report Status PENDING  Incomplete    Leonides Sake, MD, FACS [facility] [place] Office: [phone] Pager: [phone]  08/30/2016, 7:52 AM   Addendum  Pt does NOT have baseline kidney disease.  He had acute renal failure in the setting of surgical related hypotension on ACE-I/ARB.  I do not anticipate need HD anytime soon.  From my viewpoint, placement of PICC via basilic vein is a non-issue.  Given the PICC team's resistance to proceeding, will have IR place instead.  If IR resistant to such, will place a  Hickman in the OR at the next groin washout.   Leonides Sake, MD, FACS [facility] [place] Office: [phone] Pager: [phone]  08/30/2016, 6:43 PM

## 2016-08-30 NOTE — Progress Notes (Signed)
Spoke with Dr. Drue SecondSnider with Infectious Disease.  Given Strep on culture, she wants to discontinue Vanc/Zosyn and start Cefazolin 2gm IV q8h.  She will see the pt.  I will put in these orders.   Doreatha MassedSamantha Kimya Mccahill, [facility] 08/30/2016 8:41 AM

## 2016-08-30 NOTE — Progress Notes (Signed)
PHARMACY CONSULT NOTE FOR:  OUTPATIENT  PARENTERAL ANTIBIOTIC THERAPY (OPAT)  Indication: Group B Strep abscess of right ABF graft Regimen: PCN G 18 million units daily as continuous infusion End date: 10/10/2016  IV antibiotic discharge orders are pended. To discharging provider:  please sign these orders via discharge navigator,  Select New Orders & click on the button choice - Manage This Unsigned Work.     Babs BertinHaley Haroun Cotham, PharmD, BCPS Clinical Pharmacist Rx Phone # for today: [phone] After 3:30PM, please call Main Rx: [phone] 08/30/2016 11:33 AM

## 2016-08-30 NOTE — Progress Notes (Addendum)
  Will discuss with IR which central line is best for patient's renal disease and IV abtx needs  Aram BeechamCynthia B. Drue SecondSnider MD MPH [facility] [phone]

## 2016-08-31 ENCOUNTER — Encounter (HOSPITAL_COMMUNITY): Payer: Self-pay | Admitting: Interventional Radiology

## 2016-08-31 ENCOUNTER — Inpatient Hospital Stay (HOSPITAL_COMMUNITY): Payer: 59

## 2016-08-31 HISTORY — PX: IR US GUIDE VASC ACCESS RIGHT: IMG2390

## 2016-08-31 HISTORY — PX: IR FLUORO GUIDE CV LINE RIGHT: IMG2283

## 2016-08-31 LAB — CBC
HEMATOCRIT: 34.1 % — AB (ref 39.0–52.0)
Hemoglobin: 11 g/dL — ABNORMAL LOW (ref 13.0–17.0)
MCH: 29.3 pg (ref 26.0–34.0)
MCHC: 32.3 g/dL (ref 30.0–36.0)
MCV: 90.7 fL (ref 78.0–100.0)
Platelets: 314 10*3/uL (ref 150–400)
RBC: 3.76 MIL/uL — AB (ref 4.22–5.81)
RDW: 12.7 % (ref 11.5–15.5)
WBC: 9.9 10*3/uL (ref 4.0–10.5)

## 2016-08-31 MED ORDER — MIDAZOLAM HCL 2 MG/2ML IJ SOLN
INTRAMUSCULAR | Status: AC
  Filled 2016-08-31: qty 4

## 2016-08-31 MED ORDER — LIDOCAINE HCL 1 % IJ SOLN
INTRAMUSCULAR | Status: AC
Start: 2016-08-31 — End: 2016-08-31
  Filled 2016-08-31: qty 20

## 2016-08-31 MED ORDER — LIDOCAINE HCL 1 % IJ SOLN
INTRAMUSCULAR | Status: AC | PRN
Start: 2016-08-31 — End: 2016-08-31
  Administered 2016-08-31: 10 mL

## 2016-08-31 MED ORDER — FENTANYL CITRATE (PF) 100 MCG/2ML IJ SOLN
INTRAMUSCULAR | Status: AC
  Filled 2016-08-31: qty 4

## 2016-08-31 MED ORDER — LORAZEPAM 2 MG/ML IJ SOLN
INTRAMUSCULAR | Status: AC | PRN
  Administered 2016-08-31: 1 mg via INTRAVENOUS

## 2016-08-31 MED ORDER — MIDAZOLAM HCL 2 MG/2ML IJ SOLN: INTRAMUSCULAR | Status: AC | PRN

## 2016-08-31 MED ORDER — FENTANYL CITRATE (PF) 100 MCG/2ML IJ SOLN
INTRAMUSCULAR | Status: AC | PRN
  Administered 2016-08-31: 50 ug via INTRAVENOUS

## 2016-08-31 NOTE — Sedation Documentation (Signed)
Patient is resting comfortably. 

## 2016-08-31 NOTE — Progress Notes (Addendum)
[facility] [place]  Subjective  - Doing fine,waiting to see when he can go home.   Objective (!) 160/61 70 97.7 F (36.5 C) (Oral) 16 100%  Intake/Output Summary (Last 24 hours) at 08/31/16 0748 Last data filed at 08/30/16 1700  Gross per 24 hour  Intake              480 ml  Output                0 ml  Net              480 ml    Feet warm to touch Wound vac in place right groin Pending PICC placement  Assessment/Planning: POD #3 s/p R groin I&D, placement of abx beads (Vanco/Gent) for possible R aortofemoral BPG infection  Pending Dr. Drue SecondSnider speaking with IR hopefully PICC placement today for home antibiotics.  Clinton GallantCOLLINS, EMMA [facility] 08/31/2016 7:48 AM --  Laboratory Lab Results:  Recent Labs  08/30/16 1047 08/31/16 0151  WBC 7.2 9.9  HGB 11.2* 11.0*  HCT 34.1* 34.1*  PLT 270 314   BMET  Recent Labs  08/30/16 0238  NA 137  K 3.9  CL 102  CO2 27  GLUCOSE 143*  BUN 18  CREATININE 1.53*  CALCIUM 9.0    COAG Lab Results  Component Value Date   INR 1.22 08/27/2016   INR 1.18 05/21/2015   INR 1.08 05/09/2015   No results found for: PTT  Addendum  I have independently interviewed and examined the patient, and I agree with the physician assistant's findings.  IR placement of Hickman today.  Awaiting final C&S on wound cx.  VAC change tomorrow.  - will plan of washout of right groin next Tuesday, possible removal of antibiotic beads - D/C home on IV abx shortly after that depending on intraop findings  Leonides SakeBrian Denette Hass, MD, FACS [facility] [place] Office: [phone] Pager: [phone]  08/31/2016, 10:09 AM

## 2016-08-31 NOTE — Progress Notes (Signed)
[facility] for Infectious Disease    Date of Admission:  08/27/2016   Total days of antibiotics 4                                                                                      Day 2 Continuous PCN 18 million units daily    ID: Robert [name] is a 66 y.o. 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 Reports pain is minimal to right groin and swelling continues to improve. Wondering when he will get PICC placement.   Medications:  . aspirin EC  81 mg Oral Daily  . Chlorhexidine Gluconate Cloth  6 each Topical Daily  . diphenhydrAMINE  25 mg Oral QHS  . ezetimibe-simvastatin  1 tablet Oral Daily  . fenofibrate  160 mg Oral Daily  . heparin  5,000 Units Subcutaneous Q8H  . hydrochlorothiazide  25 mg Oral q morning - 10a  . LORazepam  1 mg Oral Once  . mupirocin ointment  1 application Nasal BID  . pantoprazole  40 mg Oral Daily    Objective: Vital signs in last 24 hours: Temp:  [97.6 F (36.4 C)-97.9 F (36.6 C)] 97.7 F (36.5 C) (05/22 0518) Pulse Rate:  [70-78] 70 (05/22 0518) Resp:  [16-18] 16 (05/22 0518) BP: (132-160)/(53-61) 160/61 (05/22 0518) SpO2:  [99 %-100 %] 100 % (05/22 0518)   General appearance: alert, cooperative, no distress and lying in bed, appears comfortable Resp: clear to auscultation bilaterally Cardio: regular rate and rhythm, S1, S2 normal and no murmur  GI: soft, non-tender; bowel sounds normal; no masses,  no organomegaly Neurologic: Alert and oriented X 3, normal strength and tone. Normal symmetric reflexes. Normal coordination and gait Incision/Wound: Right groin wound with VAC in place and intact. Improved soft tissue swelling. Minimal pain with palpation.   Lab Results  Recent Labs  08/30/16 0238 08/30/16 1047 08/31/16 0151  WBC  --  7.2 9.9  HGB  --  11.2*  11.0*  HCT  --  34.1* 34.1*  NA 137  --   --   K 3.9  --   --   CL 102  --   --   CO2 27  --   --   BUN 18  --   --   CREATININE 1.53*  --   --    Liver Panel No results for input(s): PROT, ALBUMIN, AST, ALT, ALKPHOS, BILITOT, BILIDIR, IBILI in the last 72 hours. Sedimentation Rate  Recent Labs  08/30/16 1047  ESRSEDRATE 70*   C-Reactive Protein  Recent Labs  08/30/16 1047  CRP 6.9*    Microbiology:  Studies/Results: No results found.   Assessment/Plan: 1. 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  -Will d/w IR for optimal PICC placement - note in chart from IVT re: patient having HD catheter was removed  last year when his kidneys recovered. Not being followed currently by nephrologist, only PCP. His nurse will reach out to [facility] to determine if they can do his PICC today vs IR (he ate this morning and will not be able to go to IR soon for placement) to assist with discharge planning.   2. CKD 3 -Feb 2017 noted with acute kidney failure (Cr 5.75). Followed medically w/o dialysis and renal recovery observed.  -Per record review seems that Valsartan + intraop bleeding/hypotension were cause of AKI. Renal US was unremarkable.  -Per patient he does not maintain follow up with nephrology outpatient.    Discharge antibiotics: Penicillin G 18 million units daily via continuous infusion  Duration: 6 weeks  End Date: October 10, 2016  [facility] Care Per Protocol:  Labs weekly while on IV antibiotics: _x_ CBC with differential _x_ BMP __ CMP _x_ CRP _x_ ESR __ Vancomycin trough  __ Please pull PIC at completion of IV antibiotics _x_ Please leave PIC in place until doctor has seen patient or been notified  Fax weekly labs to [phone]  Clinic Follow Up Appt: 6 weeks Glori Bickers, MSN, NP-C [facility]  for [place]: [phone] Pager: [phone]   08/31/2016, 9:37 AM

## 2016-08-31 NOTE — Progress Notes (Signed)
[facility]  Mr. [name] [facility].  [facility] and [facility] services for home IV ABX upon DC.  [facility] will follow pt until DC to support transition home.   If patient discharges after hours, please call [phone].   Sedalia Mutaamela S Chandler 08/31/2016, 3:10 PM

## 2016-08-31 NOTE — Sedation Documentation (Signed)
Patient denies pain and is resting comfortably.  

## 2016-08-31 NOTE — Care Management Note (Addendum)
Case Management Note Donn PieriniKristi Oriya Kettering RN, BSN Unit 2W-Case Manager [phone]  Patient Details  Name: Robert DeutscherRobert W Profitt MRN: [id number] Date of Birth: Aug 23, 1950  Subjective/Objective:   Pt admitted with vascular graft infection s/p I&D and wound VAC placement on 08/28/16             Action/Plan: PTA pt lived at home with spouse- plan to place PICC line- pt will need long term IV abx per MD notes- pt requested to speak to CM regarding d/c needs and HH services- spoke with pt at bedside- per conversation pt [facility] that [facility] with using them again as MD has mentioned using them- but pt reports concerns over insurance as he received a lettter at home stating that [facility] with [facility] at this time- discussed this with pt and options for [facility] and using other [facility]- pt states that he is ok with "whatever" as long as his services are covered- CM called Pam with [facility] to discuss pt's needs- [facility] and provide all needed services for pt- Pam to come speak with pt at bedside regarding out of pocket cost for [facility] and IV [facility]x needs. [facility]- pt states he is ok with this. CM will continue to follow- pt will need HHRN orders for home IV abx needs.   Expected Discharge Date:  09/03/16               Expected Discharge Plan:  Home w Home Health Services  In-House Referral:     Discharge planning Services  CM Consult  Post Acute Care Choice:  Home Health Choice offered to:     DME Arranged:    DME Agency:     HH Arranged:  RN, IV Antibiotics HH Agency:  [facility]  Status of Service:  In process, will continue to follow  If discussed at Long Length of Stay Meetings, dates discussed:    Discharge Disposition: Home with home health   Additional Comments:  09/01/16- 1100- Donn PieriniKristi Gean Larose RN, CM- pt has wound VAC- plan for return to OR on 5/29- with ?d/c on 5/30- may need  home wound VAC at d/c - [facility] on shadow chart for signature- if needed- will need to be approved for home wound VAC prior to discharge- Clydie BraunKaren with [facility] aware of potential need- HH orders needed for discharge - PICC has been placed  Darrold SpanWebster, Jalina Blowers Hall, RN 08/31/2016, 12:29 PM

## 2016-08-31 NOTE — Consult Note (Signed)
Chief Complaint: Patient was seen in consultation today for poor venous access  Referring Physician(s):  Robert [name]  Supervising Physician: Robert [name]  Patient Status: [facility] - In-pt  History of Present Illness: Robert [name] is a 66 y.o. male with past medical history of of HLD, HTN, PVD, and CKD Stage 3 who presented to [facility] with a vascular graft infection.  Patient has been on IV antibiotics and nearing discharge status.   IR consulted for possible PICC.  PA spoke with Dr. Bridgett [name] this AM who preferred a tunneled IJ.  Patient ate breakfast this AM.  He does have a history of poor venous access.  He currently has an IV in his upper arm.   Past Medical History:  Diagnosis Date  . Cataracts, bilateral    immature  . Chronic back pain    ruptured disc  . Chronic kidney disease    Stage III  Moderate  . Glaucoma    uses eye drops nightly  . History of bronchitis as a child   . Hyperlipidemia    takes Tricor and Vytorin daily  . Hypertension    takes Diovan HCT daily  . Hypertension 04/10/2015  . Nocturia   . Peripheral vascular disease [facility])     Past Surgical History:  Procedure Laterality Date  . AORTA - BILATERAL FEMORAL ARTERY BYPASS GRAFT N/A 05/21/2015   Procedure: AORTOBIFEMORAL BYPASS GRAFT USING HEMASHIELD GOLD 14X8MM X 40CM VASCULAR GRAFT;  Surgeon: Robert Headland, MD;  Location: [place];  Service: Vascular;  Laterality: N/A;  . APPLICATION OF WOUND VAC Right 08/28/2016   Procedure: APPLICATION OF WOUND VAC;  Surgeon: Robert [place], MD;  Location: [place];  Service: Vascular;  Laterality: Right;  . AXILLARY-FEMORAL BYPASS GRAFT Right 08/28/2016   Procedure: RIGHT GROIN EXPLORATION , IRRIGATION AND DEBRIDEMENT OF RIGHT GROIN FLUID COLLECTION,  Placement of Antibiotic Beads VAC PLACEMENT;  Surgeon: Robert Clarksburg, MD;  Location: [place];  Service: Vascular;  Laterality: Right;  . ENDARTERECTOMY FEMORAL Bilateral 05/21/2015   Procedure: ENDARTERECTOMY BILATERAL FEMORAL  ARTERIES;  Surgeon: Robert [place], MD;  Location: [place];  Service: Vascular;  Laterality: Bilateral;  . INSERTION OF DIALYSIS CATHETER N/A 05/23/2015   Procedure: INSERTION OF DIALYSIS CATHETER left internal jugular;  Surgeon: Robert Mould, MD;  Location: [place];  Service: Vascular;  Laterality: N/A;  . PATCH ANGIOPLASTY Left 05/21/2015   Procedure: LEFT FEMORAL ARTERY PATCH ANGIOPLASTY USING Robert [name] BIOLOGIC PATCH;  Surgeon: Robert Dixon, MD;  Location: [employer];  Service: Vascular;  Laterality: Left;  . PERIPHERAL VASCULAR CATHETERIZATION N/A 03/13/2015   Procedure: Abdominal Aortogram;  Surgeon: Robert [place] Randall, MD;  Location: [place] CV LAB;  Service: Cardiovascular;  Laterality: N/A;    Allergies: Valsartan-hydrochlorothiazide  Medications: Prior to Admission medications   Medication Sig Start Date End Date Taking? Authorizing Provider  aspirin 81 MG tablet Take 81 mg by mouth daily.   Yes [provider]  diphenhydrAMINE (BENADRYL) 25 mg capsule Take 25 mg by mouth at bedtime.   Yes [provider]  ezetimibe-simvastatin (VYTORIN) 10-40 MG per tablet Take 1 tablet by mouth daily.   Yes [provider]  fenofibrate (TRICOR) 145 MG tablet Take 145 mg by mouth daily.   Yes [provider]  hydrochlorothiazide (HYDRODIURIL) 25 MG tablet Take 25 mg by mouth every morning. 08/24/16  Yes [provider]  Sodium Chloride, Hypertonic, (SODIUM CHLORIDE OP) Place 1 drop into both eyes 2 (  two) times daily.   Yes [provider]  traMADol (ULTRAM) 50 MG tablet Take 1 tablet (50 mg total) by mouth every 6 (six) hours as needed for moderate pain. For PAIN   PRN 06/01/15  Yes Rhyne, Samantha J, PA-C  ciclopirox (LOPROX) 0.77 % cream Apply 1 application topically 2 (two) times daily as needed. Gel    [provider]     Family History  Problem Relation Age of Onset  . Hyperlipidemia Father   . Peripheral vascular disease Father         amputation  . Cancer Brother        colon/bowel cancer  . Hyperlipidemia Brother   . Cancer Mother   . Liver disease Mother   . Cancer Maternal Grandmother   . Heart disease Paternal Grandmother   . Diabetes Paternal Grandmother     Social History   Social History  . Marital status: Married    Spouse name: N/A  . Number of children: N/A  . Years of education: N/A   Social History Main Topics  . Smoking status: Former Research scientist (life sciences)  . Smokeless tobacco: Never Used     Comment: quit smoking 11 yrs ago  . Alcohol use 1.8 oz/week    3 Cans of beer per week     Comment: couple of beers a day  . Drug use: No  . Sexual activity: Not Asked   Other Topics Concern  . None   Social History Narrative   Epworth Sleepiness Scale = (as of 04/10/2015)    Review of Systems  Constitutional: Negative for fatigue and fever.  Respiratory: Negative for cough and shortness of breath.   Cardiovascular: Negative for chest pain.  Psychiatric/Behavioral: Negative for behavioral problems and confusion.    Vital Signs: BP (!) 160/61 (BP Location: Left Arm)   Pulse 70   Temp 97.7 F (36.5 C) (Oral)   Resp 16   Ht 5' 5.98" (1.676 m)   Wt 193 lb (87.5 kg)   SpO2 100%   BMI 31.17 kg/m   Physical Exam  Constitutional: He is oriented to person, place, and time. He appears well-developed.  Cardiovascular: Normal rate, regular rhythm and normal heart sounds.   Pulmonary/Chest: Effort normal and breath sounds normal. No respiratory distress.  Neurological: He is alert and oriented to person, place, and time.  Skin: Skin is warm and dry.  Psychiatric: He has a normal mood and affect. His behavior is normal. Judgment and thought content normal.  Nursing note and vitals reviewed.   Mallampati Score:  MD Evaluation Airway: WNL Heart: WNL Abdomen: WNL Chest/ Lungs: WNL ASA  Classification: 3 Mallampati/Airway Score: Two  Imaging: Ct Abdomen Pelvis Wo Contrast  Result Date:  08/27/2016 CLINICAL DATA:  Wound infection. 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 EXAM: CT ABDOMEN AND PELVIS WITHOUT CONTRAST TECHNIQUE: Multidetector CT imaging of the abdomen and pelvis was performed following the standard protocol without IV contrast. COMPARISON:  05/22/2015 and 06/19/2007 FINDINGS: Lower chest: Lung bases are within normal. Hepatobiliary: Mild cholelithiasis. Liver and biliary tree are within normal. Pancreas: Within normal. Spleen: Within normal. Adrenals/Urinary Tract: Adrenal glands are normal. Kidneys are normal size without hydronephrosis or nephrolithiasis. Several renal vascular calcification are present over the renal pelvic/hilar vessels. Bladder and ureters are normal. Stomach/Bowel: The stomach and small bowel are within normal. Appendix is within normal. Mild moderate diverticulosis throughout the colon. Vascular/Lymphatic: Calcified plaque over the abdominal aorta. There is evidence of patient's aortobifemoral bypass graft be  changeable the renal arteries extending to the inguinal regions. Graft is not changed. Graft patency cannot be assessed due to lack of intravenous contrast. The touchdown site over the left inguinal region is within normal. The touchdown site over the right inguinal region demonstrates increase in size of a an oval focal fluid collection immediately superficial to the distal right iliac graft/ touchdown site measuring 2.5 x 3.9 x 5.0 cm in transverse, AP and craniocaudal dimensions. This may represent a postoperative infected versus noninfected collection. Minimal subcutaneous edema adjacent this fluid collection. Cannot completely evaluate its relationship with the immediately adjacent deep vessels on this noncontrast study. No adenopathy. Reproductive: Within normal. Other: No significant free peritoneal fluid. Postsurgical change over the midline anterior abdominal wall. Musculoskeletal: Degenerate change of the spine and hips. Moderate disc  space narrowing at the L4-5 level. 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 Graft patency cannot be assessed due to lack of intravenous contrast. There is worsening of a focal oval fluid collection immediately superficial to and abutting the distal right iliac graft/ touchdown site measuring 2.5 x 3.9 x 5.0 cm with mild adjacent subcutaneous edema. This may represent an infected versus noninfected postoperative collection as cannot accurately assess its relationship to the immediately adjacent deep vessels. Consider ultrasound for further evaluation. Mild cholelithiasis. Diverticulosis of the colon. Electronically Signed   By: Marin Olp M.D.   On: 08/27/2016 14:01    Labs:  CBC:  Recent Labs  08/27/16 1554 08/28/16 0239 08/30/16 1047 08/31/16 0151  WBC 18.7* 14.7* 7.2 9.9  HGB 11.8* 11.1* 11.2* 11.0*  HCT 36.2* 33.8* 34.1* 34.1*  PLT 190 203 270 314    COAGS:  Recent Labs  08/27/16 1554  INR 1.22    BMP:  Recent Labs  08/27/16 1554 08/28/16 0239 08/30/16 0238  NA 135 135 137  K 3.5 3.5 3.9  CL 99* 100* 102  CO2 24 26 27   GLUCOSE 141* 107* 143*  BUN 15 14 18   CALCIUM 9.4 8.9 9.0  CREATININE 1.59* 1.63* 1.53*  GFRNONAA 44* 43* 46*  GFRAA 51* 50* 54*    LIVER FUNCTION TESTS:  Recent Labs  08/27/16 1554  BILITOT 0.8  AST 24  ALT 16*  ALKPHOS 43  PROT 7.2  ALBUMIN 3.7    TUMOR MARKERS: No results for input(s): AFPTM, CEA, CA199, CHROMGRNA in the last 8760 hours.  Assessment and Plan: Group B Strep deep tissue infection with concern for infected seroma surrounding R aortofemoral BPG infection.  Patient to go home with IV abx. He has a history of ARF with elevated SCr since admission. PA spoke with Dr. Bridgett [name] this AM about PICC options.  Per Dr. Bridgett [name], tunneled IJ PICC is preferred at this time.  Met with patient.  He ate breakfast this AM.  Will place NPO and plan for procedure in IR this afternoon.  Risks  and benefits discussed with the patient including, but not limited to bleeding and infection. All of the patient's questions were answered, patient is agreeable to proceed. Consent signed and in chart.  Thank you for this interesting consult.  I greatly enjoyed meeting Robert [name] and look forward to participating in their care.  A copy of this report was sent to the requesting provider on this date.  Electronically Signed: Docia Barrier, PA 08/31/2016, 10:24 AM   I spent a total of 40 Minutes    in face to face in clinical consultation, greater than 50% of which  was counseling/coordinating care for venous access.

## 2016-08-31 NOTE — Procedures (Signed)
Interventional Radiology Procedure Note  Procedure: Placement of a right IJ approach single lumen cuffed tunneled line.  Tip is positioned at the superior cavoatrial junction and catheter is ready for immediate use.  Complications: None Recommendations:  - Ok to shower tomorrow - Do not submerge  - Routine line care   Signed,  Destiney Sanabia S. Lamarco Gudiel, DO    

## 2016-09-01 LAB — CULTURE, BLOOD (ROUTINE X 2)
CULTURE: NO GROWTH
Culture: NO GROWTH
Special Requests: ADEQUATE
Special Requests: ADEQUATE

## 2016-09-01 NOTE — Progress Notes (Signed)
[facility]    Date of Admission:  08/27/2016   Total days of antibiotics 3  Day 3 Continuous PCN 18 million units daily    ID: Robert [name] is a 66 y.o. 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 Getting up and walking a little bit. Disappointed he won't be going home today.   Medications:  . aspirin EC  81 mg Oral Daily  . Chlorhexidine Gluconate Cloth  6 each Topical Daily  . diphenhydrAMINE  25 mg Oral QHS  . ezetimibe-simvastatin  1 tablet Oral Daily  . fenofibrate  160 mg Oral Daily  . heparin  5,000 Units Subcutaneous Q8H  . hydrochlorothiazide  25 mg Oral q morning - 10a  . LORazepam  1 mg Oral Once  . mupirocin ointment  1 application Nasal BID  . pantoprazole  40 mg Oral Daily    Objective: Vital signs in last 24 hours: Temp:  [98.4 F (36.9 C)-98.5 F (36.9 C)] 98.4 F (36.9 C) (05/23 0413) Pulse Rate:  [66-78] 66 (05/23 0413) Resp:  [14-18] 18 (05/23 0413) BP: (132-165)/(60-72) 132/60 (05/23 0413) SpO2:  [98 %-100 %] 99 % (05/23 0413)   General appearance: alert, cooperative, no distress and Comfortable sitting in recliner Resp: clear to auscultation bilaterally and normal effort Chest wall: right central venous catheter site unremarkable, clean and dry  Cardio: regular rate and rhythm, S1, S2 normal, no murmur, click, rub or gallop GI: soft, non-tender; bowel sounds normal; no masses,  no organomegaly  Groin Incision: Site intact with wound VAC therapy on. Minimal serosanguinous output. Non-tender  Lab Results  Recent Labs  08/30/16 0238 08/30/16 1047 08/31/16 0151  WBC  --  7.2 9.9  HGB  --  11.2* 11.0*  HCT  --  34.1* 34.1*  NA 137  --   --   K 3.9  --   --   CL 102  --    --   CO2 27  --   --   BUN 18  --   --   CREATININE 1.53*  --   --    Liver Panel No results for input(s): PROT, ALBUMIN, AST, ALT, ALKPHOS, BILITOT, BILIDIR, IBILI in the last 72 hours. Sedimentation Rate  Recent Labs  08/30/16 1047  ESRSEDRATE 70*   C-Reactive Protein  Recent Labs  08/30/16 1047  CRP 6.9*    Microbiology:  Studies/Results: Ir Fluoro Guide Cv Line Right  Result Date: 08/31/2016 INDICATION: 66 year old male with infected aortobifemoral graft. EXAM: TUNNELED PICC LINE WITH ULTRASOUND AND FLUOROSCOPIC GUIDANCE MEDICATIONS: In-hospital antibiotics. The antibiotic was given in an appropriate time interval prior to skin puncture. ANESTHESIA/SEDATION: Versed 1.0 mg IV; Fentanyl 50 mcg IV; Moderate Sedation Time:  18 The patient was continuously monitored during the procedure by the interventional radiology nurse under my direct supervision. FLUOROSCOPY TIME:  Fluoroscopy Time: 0 minutes 6 seconds (1.0 mGy). COMPLICATIONS: None PROCEDURE: After written informed consent was obtained, patient was placed in the supine position on angiographic table. Patency of the right internal jugular vein was confirmed with ultrasound with image documentation. Patient was prepped and draped in the usual sterile fashion including the right neck and right superior chest. Using ultrasound guidance, the skin and subcutaneous tissues overlying the right internal jugular vein were generously infiltrated with 1% lidocaine without  epinephrine. Using ultrasound guidance, the right internal jugular vein was punctured with a micropuncture needle, and an 018 wire was advanced into the right heart confirming venous access. A small stab incision was made with an 11 blade scalpel. Peel-away sheath was placed over the wire, and then the wire was removed, marking the wire for estimation of internal catheter length. The chest wall was then generously infiltrated with 1% lidocaine for local anesthesia along the  tissue tract. Small stab incision was made with 11 blade scalpel, and then the catheter was back tunneled to the puncture site at the right internal jugular vein. Catheter was advanced through the peel-away sheath, and the peel-away sheath was removed. Final image was stored. The catheter was anchored to the chest wall with 2 retention sutures, and Derma bond was used to seal the right internal jugular vein incision site and at the right chest wall. Patient tolerated the procedure well and remained hemodynamically stable throughout. No complications were encountered and no significant blood loss was encountered. FINDINGS: After catheter placement, the tip lies within the superior cavoatrial junction. The catheter aspirates and flushes normally and is ready for immediate use. IMPRESSION: Status post placement of right IJ cuffed tunneled catheter. Catheter ready for use. Signed, Yvone NeuJaime S. Loreta AveWagner, DO [facility] Electronically Signed   By: Gilmer MorJaime  Wagner D.O.   On: 08/31/2016 17:36   Ir Koreas Guide Vasc Access Right  Result Date: 08/31/2016 INDICATION: 66 year old male with infected aortobifemoral graft. EXAM: TUNNELED PICC LINE WITH ULTRASOUND AND FLUOROSCOPIC GUIDANCE MEDICATIONS: In-hospital antibiotics. The antibiotic was given in an appropriate time interval prior to skin puncture. ANESTHESIA/SEDATION: Versed 1.0 mg IV; Fentanyl 50 mcg IV; Moderate Sedation Time:  18 The patient was continuously monitored during the procedure by the interventional radiology nurse under my direct supervision. FLUOROSCOPY TIME:  Fluoroscopy Time: 0 minutes 6 seconds (1.0 mGy). COMPLICATIONS: None PROCEDURE: After written informed consent was obtained, patient was placed in the supine position on angiographic table. Patency of the right internal jugular vein was confirmed with ultrasound with image documentation. Patient was prepped and draped in the usual sterile fashion  including the right neck and right superior chest. Using ultrasound guidance, the skin and subcutaneous tissues overlying the right internal jugular vein were generously infiltrated with 1% lidocaine without epinephrine. Using ultrasound guidance, the right internal jugular vein was punctured with a micropuncture needle, and an 018 wire was advanced into the right heart confirming venous access. A small stab incision was made with an 11 blade scalpel. Peel-away sheath was placed over the wire, and then the wire was removed, marking the wire for estimation of internal catheter length. The chest wall was then generously infiltrated with 1% lidocaine for local anesthesia along the tissue tract. Small stab incision was made with 11 blade scalpel, and then the catheter was back tunneled to the puncture site at the right internal jugular vein. Catheter was advanced through the peel-away sheath, and the peel-away sheath was removed. Final image was stored. The catheter was anchored to the chest wall with 2 retention sutures, and Derma bond was used to seal the right internal jugular vein incision site and at the right chest wall. Patient tolerated the procedure well and remained hemodynamically stable throughout. No complications were encountered and no significant blood loss was encountered. FINDINGS: After catheter placement, the tip lies within the superior cavoatrial junction. The catheter aspirates and flushes normally and is ready for immediate use. IMPRESSION: Status post placement  of right IJ cuffed tunneled catheter. Catheter ready for use. Signed, Yvone Neu. Loreta Ave, DO [facility] Electronically Signed   By: Gilmer Mor D.O.   On: 08/31/2016 17:36     Assessment/Plan: 1. Group B Strep abscess of right ABF graft, s/p I&D -PCN G 18 million units daily with continuous infusion -Planning for 6 week duration of therapy considering the proximity to his  graft.  -Please refer to previous note for duration of orders/labs etc regarding follow up care with antibiotics.    2. CKD 3 -Feb 2017 noted with acute kidney failure (Cr 5.75). Followed medically w/o dialysis and renal recovery observed.  -Per record review seems that Valsartan + intraop bleeding/hypotension were cause of AKI. Renal US was unremarkable.  -Per patient he does not maintain follow up with nephrology outpatient.    Available as needed for discharge planning or if other issues arise.   Rexene Alberts, MSN, NP-C [facility] Cell: [phone] Pager: [phone]  .09/01/2016, 2:16 PM

## 2016-09-01 NOTE — Progress Notes (Addendum)
Daily Progress Note   Assessment/Planning:   POD #4 s/p I&D R groin   Abx: PCN IV, Abx beads: Vanco/Gent  Hickman placed yesterday  Will plan on return to OR on 29th May to remove abx beads and re-cx wound  Plan D/C on 30th May  Will change VAC later today once supplies available   Subjective  - 4 Days Post-Op   No complaints   Objective   Vitals:   08/31/16 1605 08/31/16 1610 08/31/16 2102 09/01/16 0413  BP: (!) 152/70 (!) 151/70 (!) 155/62 132/60  Pulse: 70 72 72 66  Resp: 14 18 18 18   Temp:   98.5 F (36.9 C) 98.4 F (36.9 C)  TempSrc:   Oral Oral  SpO2: 98%  98% 99%  Weight:      Height:         Intake/Output Summary (Last 24 hours) at 09/01/16 0741 Last data filed at 08/31/16 2323  Gross per 24 hour  Intake             2580 ml  Output                1 ml  Net             2579 ml    PULM  CTAB  CV  RRR  GI  soft, NTND  VASC R groin VAC adherent, minimal serosang drainage  NEURO A&O x 3    Laboratory   CBC CBC Latest Ref Rng & Units 08/31/2016 08/30/2016 08/28/2016  WBC 4.0 - 10.5 K/uL 9.9 7.2 14.7(H)  Hemoglobin 13.0 - 17.0 g/dL 11.0(L) 11.2(L) 11.1(L)  Hematocrit 39.0 - 52.0 % 34.1(L) 34.1(L) 33.8(L)  Platelets 150 - 400 K/uL 314 270 203    BMET    Component Value Date/Time   NA 137 08/30/2016 0238   K 3.9 08/30/2016 0238   CL 102 08/30/2016 0238   CO2 27 08/30/2016 0238   GLUCOSE 143 (H) 08/30/2016 0238   BUN 18 08/30/2016 0238   CREATININE 1.53 (H) 08/30/2016 0238   CALCIUM 9.0 08/30/2016 0238   GFRNONAA 46 (L) 08/30/2016 0238   GFRAA 54 (L) 08/30/2016 0238    Antibiotics Anti-infectives    Start     Dose/Rate Route Frequency Ordered Stop   08/30/16 1030  penicillin G potassium 9 Million Units in dextrose 5 % 500 mL continuous infusion     9 Million Units 41.7 mL/hr over 12 Hours Intravenous Every 12 hours 08/30/16 1024     08/30/16 0845  ceFAZolin (ANCEF) IVPB 2g/100 mL premix  Status:  Discontinued     2 g 200 mL/hr  over 30 Minutes Intravenous Every 8 hours 08/30/16 0843 08/30/16 1024   08/28/16 1056  vancomycin (VANCOCIN) powder  Status:  Discontinued       As needed 08/28/16 1056 08/28/16 1103   08/28/16 0745  gentamicin (GARAMYCIN) injection 1,000 mg     1,000 mg Other To Surgery 08/28/16 0740 08/29/16 0745   08/28/16 0400  vancomycin (VANCOCIN) IVPB 750 mg/150 ml premix  Status:  Discontinued     750 mg 150 mL/hr over 60 Minutes Intravenous Every 12 hours 08/27/16 1736 08/30/16 0843   08/27/16 1400  piperacillin-tazobactam (ZOSYN) IVPB 3.375 g  Status:  Discontinued     3.375 g 12.5 mL/hr over 240 Minutes Intravenous Every 8 hours 08/27/16 1314 08/30/16 0843   08/27/16 1315  vancomycin (VANCOCIN) 1,500 mg in sodium chloride 0.9 % 500 mL IVPB  1,500 mg 250 mL/hr over 120 Minutes Intravenous  Once 08/27/16 1314 08/27/16 1816      Leonides SakeBrian Bless Belshe, MD, FACS [facility] [place] Office: [phone] Pager: [phone]  09/01/2016, 7:41 AM    Addendum  R groin: >30% contracture of wound, +good granulation, some serous fluid at base of wound  - VAC replaced without any difficulties  Leonides SakeBrian Creta Dorame, MD, FACS [facility] [place] Office: [phone] Pager: [phone]  09/01/2016, 10:37 AM

## 2016-09-02 MED ORDER — SODIUM CHLORIDE 0.9% FLUSH
10.0000 mL | INTRAVENOUS | Status: DC | PRN
  Administered 2016-09-03: 10 mL
  Filled 2016-09-02: qty 40

## 2016-09-02 NOTE — Progress Notes (Signed)
   Daily Progress Note   Assessment/Planning:   POD #5 s/p I&D R groin abscess, VAC placement in setting ABF   VAC adherent  Pt remains asx  No evidence of bacteremia on BCx2: final culture completed  Wound C&S final: strep sensitive to PCN, cephalosporins, and Vanco  Return to OR this coming Tuesday to removed abx beads and reculture wound base  Plan home Wednesday.  Will need to set up :  Home Abx: Penicillin G 18 million units IV daily  Home VAC: M-W-F  Home labs: weeks CBC, BMP, CRP, ESR  F/U with Dr. Baxter Flattery in 6 weeks   Subjective  - 5 Days Post-Op   No complaints   Objective   Vitals:   09/01/16 0413 09/01/16 1527 09/01/16 1932 09/02/16 0534  BP: 132/60 (!) 134/58 (!) 152/59 (!) 130/56  Pulse: 66 76 83 72  Resp: _0 Temp: 98.4 F (36.9 C)  98.5 F (36.9 C) 98.1 F (36.7 C)  TempSrc: Oral  Oral Oral  SpO2: 99% 97% 96% 96%  Weight:      Height:        No intake or output data in the 24 hours ending 09/02/16 0914  PULM  CTAB  CV  RRR  GI  soft, NTND  VASC R VAC adherent, groin erythema resolved, minimal serosang drainage    Laboratory   CBC CBC Latest Ref Rng & Units 08/31/2016 08/30/2016 08/28/2016  WBC 4.0 - 10.5 K/uL 9.9 7.2 14.7(H)  Hemoglobin 13.0 - 17.0 g/dL 11.0(L) 11.2(L) 11.1(L)  Hematocrit 39.0 - 52.0 % 34.1(L) 34.1(L) 33.8(L)  Platelets 150 - 400 K/uL 314 270 203    BMET    Component Value Date/Time   NA 137 08/30/2016 0238   K 3.9 08/30/2016 0238   CL 102 08/30/2016 0238   CO2 27 08/30/2016 0238   GLUCOSE 143 (H) 08/30/2016 0238   BUN 18 08/30/2016 0238   CREATININE 1.53 (H) 08/30/2016 0238   CALCIUM 9.0 08/30/2016 0238   GFRNONAA 46 (L) 08/30/2016 0238   GFRAA 54 (L) 08/30/2016 0238     Adele Barthel, MD, FACS [facility] [place] Office: [phone] Pager: [phone]  09/02/2016, 9:14 AM

## 2016-09-03 NOTE — Progress Notes (Signed)
  Progress Note    09/03/2016 6:14 PM 6 Days Post-Op  Subjective:  No complaints  Afebrile VSS 99% RA  Vitals:   09/03/16 0531 09/03/16 1330  BP: 140/62 (!) 135/59  Pulse: 72 79  Resp: 18 20  Temp: 97.8 F (36.6 C) 98.2 F (36.8 C)    Physical Exam: Lungs:  Non labored Incisions:  Clean and dry with good granulation tissue   CBC    Component Value Date/Time   WBC 9.9 08/31/2016 0151   RBC 3.76 (L) 08/31/2016 0151   HGB 11.0 (L) 08/31/2016 0151   HCT 34.1 (L) 08/31/2016 0151   PLT 314 08/31/2016 0151   MCV 90.7 08/31/2016 0151   MCH 29.3 08/31/2016 0151   MCHC 32.3 08/31/2016 0151   RDW 12.7 08/31/2016 0151    BMET    Component Value Date/Time   NA 137 08/30/2016 0238   K 3.9 08/30/2016 0238   CL 102 08/30/2016 0238   CO2 27 08/30/2016 0238   GLUCOSE 143 (H) 08/30/2016 0238   BUN 18 08/30/2016 0238   CREATININE 1.53 (H) 08/30/2016 0238   CALCIUM 9.0 08/30/2016 0238   GFRNONAA 46 (L) 08/30/2016 0238   GFRAA 54 (L) 08/30/2016 0238    INR    Component Value Date/Time   INR 1.22 08/27/2016 1554     Intake/Output Summary (Last 24 hours) at 09/03/16 1814 Last data filed at 09/03/16 1700  Gross per 24 hour  Intake             1210 ml  Output                1 ml  Net             1209 ml     Assessment:  66 y.o. male is s/p:  I&D R groin abscess, VAC placement in setting ABF  6 Days Post-Op  Plan: -pt doing well  -incision is clean and dry with good granulation tissue -wound vac placed and has good seal  -for OR on Tuesday    Doreatha MassedSamantha Shaana Acocella, PA-C [facility] [phone] 09/03/2016 6:14 PM

## 2016-09-04 LAB — GLUCOSE, CAPILLARY: Glucose-Capillary: 119 mg/dL — ABNORMAL HIGH (ref 65–99)

## 2016-09-04 NOTE — Progress Notes (Addendum)
  Progress Note    09/04/2016 8:35 AM 7 Days Post-Op  Subjective:  Pt doing well this morning - no complaints.  Says this is healing a lot faster than last year.  Hoping it will heal as fast next week after surgery.  Afebrile HR  70's-80's  130's-140's systolic 98% RA  Vitals:   09/03/16 1955 09/04/16 0432  BP: (!) 142/59 (!) 130/59  Pulse: 83 78  Resp: 18 18  Temp: 98.4 F (36.9 C) 98 F (36.7 C)    Physical Exam: Lungs:  Non labored Incisions:  Wound vac with good seal   CBC    Component Value Date/Time   WBC 9.9 08/31/2016 0151   RBC 3.76 (L) 08/31/2016 0151   HGB 11.0 (L) 08/31/2016 0151   HCT 34.1 (L) 08/31/2016 0151   PLT 314 08/31/2016 0151   MCV 90.7 08/31/2016 0151   MCH 29.3 08/31/2016 0151   MCHC 32.3 08/31/2016 0151   RDW 12.7 08/31/2016 0151    BMET    Component Value Date/Time   NA 137 08/30/2016 0238   K 3.9 08/30/2016 0238   CL 102 08/30/2016 0238   CO2 27 08/30/2016 0238   GLUCOSE 143 (H) 08/30/2016 0238   BUN 18 08/30/2016 0238   CREATININE 1.53 (H) 08/30/2016 0238   CALCIUM 9.0 08/30/2016 0238   GFRNONAA 46 (L) 08/30/2016 0238   GFRAA 54 (L) 08/30/2016 0238    INR    Component Value Date/Time   INR 1.22 08/27/2016 1554     Intake/Output Summary (Last 24 hours) at 09/04/16 0835 Last data filed at 09/03/16 1800  Gross per 24 hour  Intake              480 ml  Output                0 ml  Net              480 ml     Assessment:  66 y.o. male is s/p:  I&D R groin abscess, VAC placement in setting ABF   7 Days Post-Op  Plan: -pt doing well this morning-afebrile -continue abx -OR Tuesday -continue mobilization -tele dc'd yesterday -DVT prophylaxis:  SQ heparin   Doreatha MassedSamantha Rhyne, PA-C [facility] [phone] 09/04/2016 8:35 AM  I have examined the patient, reviewed and agree with above.  Gretta BeganEarly, Hayde Kilgour, MD 09/04/2016 11:31 AM

## 2016-09-05 LAB — AEROBIC/ANAEROBIC CULTURE (SURGICAL/DEEP WOUND)

## 2016-09-05 LAB — AEROBIC/ANAEROBIC CULTURE W GRAM STAIN (SURGICAL/DEEP WOUND)

## 2016-09-05 NOTE — Progress Notes (Signed)
Subjective: Interval History: none..   Objective: Vital signs in last 24 hours: Temp:  [97.7 F (36.5 C)-98.4 F (36.9 C)] 97.7 F (36.5 C) (05/27 0500) Pulse Rate:  [75-80] 75 (05/27 0500) Resp:  [17-18] 18 (05/27 0500) BP: (118-149)/(60-64) 118/60 (05/27 0500) SpO2:  [98 %-99 %] 98 % (05/27 0500)  Intake/Output from previous day: 05/26 0701 - 05/27 0700 In: 600 [P.O.:600] Out: -  Intake/Output this shift: No intake/output data recorded.  Wound VAC working properly with no leak.  Lab Results: No results for input(s): WBC, HGB, HCT, PLT in the last 72 hours. BMET No results for input(s): NA, K, CL, CO2, GLUCOSE, BUN, CREATININE, CALCIUM in the last 72 hours.  Studies/Results: Ct Abdomen Pelvis Wo Contrast  Result Date: 08/27/2016 CLINICAL DATA:  Wound infection. 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 EXAM: CT ABDOMEN AND PELVIS WITHOUT CONTRAST TECHNIQUE: Multidetector CT imaging of the abdomen and pelvis was performed following the standard protocol without IV contrast. COMPARISON:  05/22/2015 and 06/19/2007 FINDINGS: Lower chest: Lung bases are within normal. Hepatobiliary: Mild cholelithiasis. Liver and biliary tree are within normal. Pancreas: Within normal. Spleen: Within normal. Adrenals/Urinary Tract: Adrenal glands are normal. Kidneys are normal size without hydronephrosis or nephrolithiasis. Several renal vascular calcification are present over the renal pelvic/hilar vessels. Bladder and ureters are normal. Stomach/Bowel: The stomach and small bowel are within normal. Appendix is within normal. Mild moderate diverticulosis throughout the colon. Vascular/Lymphatic: Calcified plaque over the abdominal aorta. There is evidence of patient's aortobifemoral bypass graft be changeable the renal arteries extending to the inguinal regions. Graft is not changed. Graft patency cannot be assessed due to lack of intravenous contrast. The touchdown site over the left inguinal  region is within normal. The touchdown site over the right inguinal region demonstrates increase in size of a an oval focal fluid collection immediately superficial to the distal right iliac graft/ touchdown site measuring 2.5 x 3.9 x 5.0 cm in transverse, AP and craniocaudal dimensions. This may represent a postoperative infected versus noninfected collection. Minimal subcutaneous edema adjacent this fluid collection. Cannot completely evaluate its relationship with the immediately adjacent deep vessels on this noncontrast study. No adenopathy. Reproductive: Within normal. Other: No significant free peritoneal fluid. Postsurgical change over the midline anterior abdominal wall. Musculoskeletal: Degenerate change of the spine and hips. Moderate disc space narrowing at the L4-5 level. 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 Graft patency cannot be assessed due to lack of intravenous contrast. There is worsening of a focal oval fluid collection immediately superficial to and abutting the distal right iliac graft/ touchdown site measuring 2.5 x 3.9 x 5.0 cm with mild adjacent subcutaneous edema. This may represent an infected versus noninfected postoperative collection as cannot accurately assess its relationship to the immediately adjacent deep vessels. Consider ultrasound for further evaluation. Mild cholelithiasis. Diverticulosis of the colon. Electronically Signed   By: Elberta Fortis M.D.   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 EXAM: TUNNELED PICC LINE WITH ULTRASOUND AND FLUOROSCOPIC GUIDANCE MEDICATIONS: In-hospital antibiotics. The antibiotic was given in an appropriate time interval prior to skin puncture. ANESTHESIA/SEDATION: Versed 1.0 mg IV; Fentanyl 50 mcg IV; Moderate Sedation Time:  18 The patient was continuously monitored during the procedure by  the interventional radiology nurse under my direct supervision. FLUOROSCOPY TIME:  Fluoroscopy Time: 0 minutes 6 seconds (1.0 mGy). COMPLICATIONS: None PROCEDURE: After written informed consent was  obtained, patient was placed in the supine position on angiographic table. Patency of the right internal jugular vein was confirmed with ultrasound with image documentation. Patient was prepped and draped in the usual sterile fashion including the right neck and right superior chest. Using ultrasound guidance, the skin and subcutaneous tissues overlying the right internal jugular vein were generously infiltrated with 1% lidocaine without epinephrine. Using ultrasound guidance, the right internal jugular vein was punctured with a micropuncture needle, and an 018 wire was advanced into the right heart confirming venous access. A small stab incision was made with an 11 blade scalpel. Peel-away sheath was placed over the wire, and then the wire was removed, marking the wire for estimation of internal catheter length. The chest wall was then generously infiltrated with 1% lidocaine for local anesthesia along the tissue tract. Small stab incision was made with 11 blade scalpel, and then the catheter was back tunneled to the puncture site at the right internal jugular vein. Catheter was advanced through the peel-away sheath, and the peel-away sheath was removed. Final image was stored. The catheter was anchored to the chest wall with 2 retention sutures, and Derma bond was used to seal the right internal jugular vein incision site and at the right chest wall. Patient tolerated the procedure well and remained hemodynamically stable throughout. No complications were encountered and no significant blood loss was encountered. FINDINGS: After catheter placement, the tip lies within the superior cavoatrial junction. The catheter aspirates and flushes normally and is ready for immediate use. IMPRESSION: Status post placement of right  IJ cuffed tunneled catheter. Catheter ready for use. Signed, Yvone Neu. Loreta Ave, DO [facility] Electronically Signed   By: Gilmer Mor D.O.   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 EXAM: TUNNELED PICC LINE WITH ULTRASOUND AND FLUOROSCOPIC GUIDANCE MEDICATIONS: In-hospital antibiotics. The antibiotic was given in an appropriate time interval prior to skin puncture. ANESTHESIA/SEDATION: Versed 1.0 mg IV; Fentanyl 50 mcg IV; Moderate Sedation Time:  18 The patient was continuously monitored during the procedure by the interventional radiology nurse under my direct supervision. FLUOROSCOPY TIME:  Fluoroscopy Time: 0 minutes 6 seconds (1.0 mGy). COMPLICATIONS: None PROCEDURE: After written informed consent was obtained, patient was placed in the supine position on angiographic table. Patency of the right internal jugular vein was confirmed with ultrasound with image documentation. Patient was prepped and draped in the usual sterile fashion including the right neck and right superior chest. Using ultrasound guidance, the skin and subcutaneous tissues overlying the right internal jugular vein were generously infiltrated with 1% lidocaine without epinephrine. Using ultrasound guidance, the right internal jugular vein was punctured with a micropuncture needle, and an 018 wire was advanced into the right heart confirming venous access. A small stab incision was made with an 11 blade scalpel. Peel-away sheath was placed over the wire, and then the wire was removed, marking the wire for estimation of internal catheter length. The chest wall was then generously infiltrated with 1% lidocaine for local anesthesia along the tissue tract. Small stab incision was made with 11 blade scalpel, and then the catheter was back tunneled to the puncture site at the right  internal jugular vein. Catheter was advanced through the peel-away sheath, and the peel-away sheath was removed. Final image was stored. The catheter was anchored to the chest wall with 2 retention sutures, and Derma bond was used  to seal the right internal jugular vein incision site and at the right chest wall. Patient tolerated the procedure well and remained hemodynamically stable throughout. No complications were encountered and no significant blood loss was encountered. FINDINGS: After catheter placement, the tip lies within the superior cavoatrial junction. The catheter aspirates and flushes normally and is ready for immediate use. IMPRESSION: Status post placement of right IJ cuffed tunneled catheter. Catheter ready for use. Signed, Yvone NeuJaime S. Loreta AveWagner, DO [facility] Electronically Signed   By: Gilmer MorJaime  Wagner D.O.   On: 08/31/2016 17:36   Anti-infectives: Anti-infectives    Start     Dose/Rate Route Frequency Ordered Stop   08/30/16 1030  penicillin G potassium 9 Million Units in dextrose 5 % 500 mL continuous infusion     9 Million Units 41.7 mL/hr over 12 Hours Intravenous Every 12 hours 08/30/16 1024     08/30/16 0845  ceFAZolin (ANCEF) IVPB 2g/100 mL premix  Status:  Discontinued     2 g 200 mL/hr over 30 Minutes Intravenous Every 8 hours 08/30/16 0843 08/30/16 1024   08/28/16 1056  vancomycin (VANCOCIN) powder  Status:  Discontinued       As needed 08/28/16 1056 08/28/16 1103   08/28/16 0745  gentamicin (GARAMYCIN) injection 1,000 mg     1,000 mg Other To Surgery 08/28/16 0740 08/29/16 0745   08/28/16 0400  vancomycin (VANCOCIN) IVPB 750 mg/150 ml premix  Status:  Discontinued     750 mg 150 mL/hr over 60 Minutes Intravenous Every 12 hours 08/27/16 1736 08/30/16 0843   08/27/16 1400  piperacillin-tazobactam (ZOSYN) IVPB 3.375 g  Status:  Discontinued     3.375 g 12.5 mL/hr over 240 Minutes Intravenous Every 8 hours 08/27/16 1314  08/30/16 0843   08/27/16 1315  vancomycin (VANCOCIN) 1,500 mg in sodium chloride 0.9 % 500 mL IVPB     1,500 mg 250 mL/hr over 120 Minutes Intravenous  Once 08/27/16 1314 08/27/16 1816      Assessment/Plan: s/p Procedure(s): RIGHT GROIN EXPLORATION , IRRIGATION AND DEBRIDEMENT OF RIGHT GROIN FLUID COLLECTION,  Placement of Antibiotic Beads VAC PLACEMENT (Right) APPLICATION OF WOUND VAC (Right) Stable overall. Continue antibiotics and wound VAC. Patient is up walking without difficulty. For debridement on Tuesday   LOS: 9 days   Krisha Beegle 09/05/2016, 11:07 AM

## 2016-09-06 NOTE — Progress Notes (Addendum)
  Progress Note    09/06/2016 9:11 AM 9 Days Post-Op  Subjective:  Sleeping-awakes easily; no ncomplaints  Afebrile HR 70's-80's  120's-140's systolic 98% RA  Vitals:   09/05/16 1954 09/06/16 0500  BP: (!) 147/61 129/61  Pulse: 80 72  Resp: 16 18  Temp: 98.5 F (36.9 C) 98.1 F (36.7 C)   CBC    Component Value Date/Time   WBC 9.9 08/31/2016 0151   RBC 3.76 (L) 08/31/2016 0151   HGB 11.0 (L) 08/31/2016 0151   HCT 34.1 (L) 08/31/2016 0151   PLT 314 08/31/2016 0151   MCV 90.7 08/31/2016 0151   MCH 29.3 08/31/2016 0151   MCHC 32.3 08/31/2016 0151   RDW 12.7 08/31/2016 0151    BMET    Component Value Date/Time   NA 137 08/30/2016 0238   K 3.9 08/30/2016 0238   CL 102 08/30/2016 0238   CO2 27 08/30/2016 0238   GLUCOSE 143 (H) 08/30/2016 0238   BUN 18 08/30/2016 0238   CREATININE 1.53 (H) 08/30/2016 0238   CALCIUM 9.0 08/30/2016 0238   GFRNONAA 46 (L) 08/30/2016 0238   GFRAA 54 (L) 08/30/2016 0238    INR    Component Value Date/Time   INR 1.22 08/27/2016 1554     Intake/Output Summary (Last 24 hours) at 09/06/16 0911 Last data filed at 09/06/16 0500  Gross per 24 hour  Intake              420 ml  Output               25 ml  Net              395 ml     Assessment:  66 y.o. male is s/p:  I&D R groin abscess, VAC placement in setting ABF  9 Days Post-Op  Plan: -pt doing well this am.  Vac with good seal.   -pt for OR tomorrow for washout and removal of abx beads and re-culture wound -npo after MN and consent (ordered)    Doreatha MassedSamantha Rhyne, PA-C [facility] [phone] 09/06/2016 9:11 AM

## 2016-09-06 NOTE — Anesthesia Preprocedure Evaluation (Signed)
Anesthesia Evaluation  Patient identified by MRN, date of birth, ID band Patient awake    Reviewed: Allergy & Precautions, NPO status , Patient's Chart, lab work & pertinent test results  Airway Mallampati: II  TM Distance: >3 FB Neck ROM: Full    Dental  (+) Teeth Intact   Pulmonary neg shortness of breath, neg sleep apnea, neg COPD, neg recent URI, former smoker,    breath sounds clear to auscultation       Cardiovascular hypertension, Pt. on medications (-) angina+ Peripheral Vascular Disease   Rhythm:Regular Rate:Normal     Neuro/Psych negative neurological ROS  negative psych ROS   GI/Hepatic negative GI ROS, Neg liver ROS,   Endo/Other  Morbid obesity  Renal/GU Renal InsufficiencyRenal diseaseHistory noted. CE     Musculoskeletal   Abdominal   Peds  Hematology  (+) anemia ,   Anesthesia Other Findings   Reproductive/Obstetrics                             Lab Results  Component Value Date   WBC 9.9 08/31/2016   HGB 11.0 (L) 08/31/2016   HCT 34.1 (L) 08/31/2016   MCV 90.7 08/31/2016   PLT 314 08/31/2016   Lab Results  Component Value Date   CREATININE 1.53 (H) 08/30/2016   BUN 18 08/30/2016   NA 137 08/30/2016   K 3.9 08/30/2016   CL 102 08/30/2016   CO2 27 08/30/2016    Anesthesia Physical  Anesthesia Plan  ASA: III  Anesthesia Plan: General   Post-op Pain Management:    Induction: Intravenous  Airway Management Planned: Oral ETT  Additional Equipment: None  Intra-op Plan:   Post-operative Plan: Extubation in OR  Informed Consent: I have reviewed the patients History and Physical, chart, labs and discussed the procedure including the risks, benefits and alternatives for the proposed anesthesia with the patient or authorized representative who has indicated his/her understanding and acceptance.   Dental advisory given  Plan Discussed with: CRNA and  Surgeon  Anesthesia Plan Comments:         Anesthesia Quick Evaluation

## 2016-09-07 ENCOUNTER — Inpatient Hospital Stay (HOSPITAL_COMMUNITY): Payer: 59 | Admitting: Anesthesiology

## 2016-09-07 ENCOUNTER — Encounter (HOSPITAL_COMMUNITY)
Admission: AD | Disposition: A | Discharge status: Home Health | Payer: Self-pay | Source: Ambulatory Visit | Attending: Vascular Surgery

## 2016-09-07 ENCOUNTER — Telehealth: Payer: Self-pay | Admitting: Vascular Surgery

## 2016-09-07 HISTORY — PX: I & D EXTREMITY: SHX5045

## 2016-09-07 LAB — BASIC METABOLIC PANEL
Anion gap: 9 (ref 5–15)
BUN: 27 mg/dL — AB (ref 6–20)
CHLORIDE: 101 mmol/L (ref 101–111)
CO2: 28 mmol/L (ref 22–32)
CREATININE: 1.59 mg/dL — AB (ref 0.61–1.24)
Calcium: 9.4 mg/dL (ref 8.9–10.3)
GFR calc Af Amer: 51 mL/min — ABNORMAL LOW (ref >60)
GFR calc non Af Amer: 44 mL/min — ABNORMAL LOW (ref >60)
GLUCOSE: 98 mg/dL (ref 65–99)
POTASSIUM: 3.8 mmol/L (ref 3.5–5.1)
SODIUM: 138 mmol/L (ref 135–145)

## 2016-09-07 LAB — PROTIME-INR
INR: 1.06
Prothrombin Time: 13.8 seconds (ref 11.4–15.2)

## 2016-09-07 LAB — CBC
HEMATOCRIT: 36.8 % — AB (ref 39.0–52.0)
Hemoglobin: 12 g/dL — ABNORMAL LOW (ref 13.0–17.0)
MCH: 29.6 pg (ref 26.0–34.0)
MCHC: 32.6 g/dL (ref 30.0–36.0)
MCV: 90.9 fL (ref 78.0–100.0)
PLATELETS: 277 10*3/uL (ref 150–400)
RBC: 4.05 MIL/uL — ABNORMAL LOW (ref 4.22–5.81)
RDW: 13 % (ref 11.5–15.5)
WBC: 10.7 10*3/uL — AB (ref 4.0–10.5)

## 2016-09-07 SURGERY — IRRIGATION AND DEBRIDEMENT EXTREMITY
Anesthesia: General | Laterality: Right

## 2016-09-07 MED ORDER — ONDANSETRON HCL 4 MG/2ML IJ SOLN
INTRAMUSCULAR | Status: DC | PRN
  Administered 2016-09-07: 4 mg via INTRAVENOUS

## 2016-09-07 MED ORDER — LACTATED RINGERS IV SOLN
INTRAVENOUS | Status: DC | PRN
  Administered 2016-09-07: 08:00:00 via INTRAVENOUS

## 2016-09-07 MED ORDER — PROPOFOL 10 MG/ML IV BOLUS
INTRAVENOUS | Status: DC | PRN
  Administered 2016-09-07: 150 mg via INTRAVENOUS

## 2016-09-07 MED ORDER — SUGAMMADEX SODIUM 200 MG/2ML IV SOLN
INTRAVENOUS | Status: AC
  Filled 2016-09-07: qty 2

## 2016-09-07 MED ORDER — 0.9 % SODIUM CHLORIDE (POUR BTL) OPTIME
TOPICAL | Status: DC | PRN
  Administered 2016-09-07: 1000 mL

## 2016-09-07 MED ORDER — PROMETHAZINE HCL 25 MG/ML IJ SOLN: 6.2500 mg | INTRAMUSCULAR | Status: DC | PRN

## 2016-09-07 MED ORDER — FENTANYL CITRATE (PF) 100 MCG/2ML IJ SOLN
INTRAMUSCULAR | Status: DC | PRN
  Administered 2016-09-07: 50 ug via INTRAVENOUS
  Administered 2016-09-07: 100 ug via INTRAVENOUS

## 2016-09-07 MED ORDER — LIDOCAINE 2% (20 MG/ML) 5 ML SYRINGE
INTRAMUSCULAR | Status: AC
  Filled 2016-09-07: qty 5

## 2016-09-07 MED ORDER — LIDOCAINE HCL (CARDIAC) 20 MG/ML IV SOLN
INTRAVENOUS | Status: DC | PRN
  Administered 2016-09-07: 100 mg via INTRAVENOUS

## 2016-09-07 MED ORDER — MIDAZOLAM HCL 5 MG/5ML IJ SOLN
INTRAMUSCULAR | Status: DC | PRN
  Administered 2016-09-07: 2 mg via INTRAVENOUS

## 2016-09-07 MED ORDER — HYDROMORPHONE HCL 1 MG/ML IJ SOLN: 0.2500 mg | INTRAMUSCULAR | Status: DC | PRN

## 2016-09-07 MED ORDER — MORPHINE SULFATE (PF) 4 MG/ML IV SOLN: 2.0000 mg | INTRAVENOUS | Status: DC | PRN

## 2016-09-07 MED ORDER — MIDAZOLAM HCL 2 MG/2ML IJ SOLN
INTRAMUSCULAR | Status: AC
  Filled 2016-09-07: qty 2

## 2016-09-07 MED ORDER — FENTANYL CITRATE (PF) 250 MCG/5ML IJ SOLN
INTRAMUSCULAR | Status: AC
  Filled 2016-09-07: qty 5

## 2016-09-07 MED ORDER — HEPARIN SODIUM (PORCINE) 5000 UNIT/ML IJ SOLN
5000.0000 [IU] | Freq: Three times a day (TID) | INTRAMUSCULAR | Status: DC
  Administered 2016-09-08: 5000 [IU] via SUBCUTANEOUS

## 2016-09-07 MED ORDER — PROPOFOL 10 MG/ML IV BOLUS
INTRAVENOUS | Status: AC
  Filled 2016-09-07: qty 20

## 2016-09-07 MED ORDER — ROCURONIUM BROMIDE 10 MG/ML (PF) SYRINGE
PREFILLED_SYRINGE | INTRAVENOUS | Status: AC
  Filled 2016-09-07: qty 5

## 2016-09-07 MED ORDER — HEMOSTATIC AGENTS (NO CHARGE) OPTIME
TOPICAL | Status: DC | PRN
  Administered 2016-09-07: 1 via TOPICAL

## 2016-09-07 MED ORDER — ONDANSETRON HCL 4 MG/2ML IJ SOLN
INTRAMUSCULAR | Status: AC
  Filled 2016-09-07: qty 2

## 2016-09-07 MED ORDER — SUGAMMADEX SODIUM 200 MG/2ML IV SOLN
INTRAVENOUS | Status: DC | PRN
Start: 2016-09-07 — End: 2016-09-07
  Administered 2016-09-07: 180 mg via INTRAVENOUS

## 2016-09-07 MED ORDER — SODIUM CHLORIDE 0.9 % IR SOLN
Status: DC | PRN
  Administered 2016-09-07: 3000 mL

## 2016-09-07 MED ORDER — ROCURONIUM BROMIDE 100 MG/10ML IV SOLN
INTRAVENOUS | Status: DC | PRN
  Administered 2016-09-07: 50 mg via INTRAVENOUS

## 2016-09-07 SURGICAL SUPPLY — 40 items
AGENT HMST SPONGE THK3/8 (HEMOSTASIS) ×1
BANDAGE ACE 4X5 VEL STRL LF (GAUZE/BANDAGES/DRESSINGS) IMPLANT
BANDAGE ACE 6X5 VEL STRL LF (GAUZE/BANDAGES/DRESSINGS) IMPLANT
BNDG GAUZE ELAST 4 BULKY (GAUZE/BANDAGES/DRESSINGS) IMPLANT
CANISTER SUCT 3000ML PPV (MISCELLANEOUS) ×2 IMPLANT
COVER SURGICAL LIGHT HANDLE (MISCELLANEOUS) ×4 IMPLANT
DRAPE HALF SHEET 40X57 (DRAPES) ×2 IMPLANT
DRAPE INCISE IOBAN 66X45 STRL (DRAPES) IMPLANT
DRAPE ORTHO SPLIT 77X108 STRL (DRAPES)
DRAPE SURG ORHT 6 SPLT 77X108 (DRAPES) IMPLANT
DRSG VAC ATS SM SENSATRAC (GAUZE/BANDAGES/DRESSINGS) ×1 IMPLANT
ELECT REM PT RETURN 9FT ADLT (ELECTROSURGICAL) ×2
ELECTRODE REM PT RTRN 9FT ADLT (ELECTROSURGICAL) ×1 IMPLANT
GAUZE SPONGE 4X4 12PLY STRL (GAUZE/BANDAGES/DRESSINGS) ×2 IMPLANT
GLOVE BIO SURGEON STRL SZ 6.5 (GLOVE) ×3 IMPLANT
GLOVE BIO SURGEON STRL SZ7 (GLOVE) ×2 IMPLANT
GLOVE BIOGEL PI IND STRL 6.5 (GLOVE) IMPLANT
GLOVE BIOGEL PI IND STRL 7.0 (GLOVE) IMPLANT
GLOVE BIOGEL PI IND STRL 7.5 (GLOVE) ×1 IMPLANT
GLOVE BIOGEL PI INDICATOR 6.5 (GLOVE) ×2
GLOVE BIOGEL PI INDICATOR 7.0 (GLOVE) ×1
GLOVE BIOGEL PI INDICATOR 7.5 (GLOVE) ×1
GLOVE SURG SS PI 6.5 STRL IVOR (GLOVE) ×1 IMPLANT
GOWN STRL REUS W/ TWL LRG LVL3 (GOWN DISPOSABLE) ×3 IMPLANT
GOWN STRL REUS W/TWL LRG LVL3 (GOWN DISPOSABLE) ×8
HEMOSTAT SPONGE AVITENE ULTRA (HEMOSTASIS) ×1 IMPLANT
KIT BASIN OR (CUSTOM PROCEDURE TRAY) ×2 IMPLANT
KIT ROOM TURNOVER OR (KITS) ×2 IMPLANT
NS IRRIG 1000ML POUR BTL (IV SOLUTION) ×2 IMPLANT
PACK GENERAL/GYN (CUSTOM PROCEDURE TRAY) IMPLANT
PAD ARMBOARD 7.5X6 YLW CONV (MISCELLANEOUS) ×4 IMPLANT
SUT ETHILON 3 0 PS 1 (SUTURE) IMPLANT
SUT MNCRL AB 4-0 PS2 18 (SUTURE) IMPLANT
SUT VIC AB 2-0 CT1 27 (SUTURE) ×2
SUT VIC AB 2-0 CT1 TAPERPNT 27 (SUTURE) IMPLANT
SUT VIC AB 2-0 CTX 36 (SUTURE) IMPLANT
SUT VIC AB 3-0 SH 27 (SUTURE) ×2
SUT VIC AB 3-0 SH 27X BRD (SUTURE) IMPLANT
WATER STERILE IRR 1000ML POUR (IV SOLUTION) ×2 IMPLANT
WND VAC CANISTER 500ML (MISCELLANEOUS) ×1 IMPLANT

## 2016-09-07 NOTE — Transfer of Care (Signed)
Immediate Anesthesia Transfer of Care Note  Patient: Robert DeutscherRobert W Mcquitty  Procedure(s) Performed: Procedure(s): RIGHT GROIN WASHOUT WITH REMOVAL BEADS AND WOUND VAC PLACEMENT (Right)  Patient Location: PACU  Anesthesia Type:General  Level of Consciousness: awake, alert , oriented and patient cooperative  Airway & Oxygen Therapy: Patient Spontanous Breathing  Post-op Assessment: Report given to RN and Post -op Vital signs reviewed and stable  Post vital signs: Reviewed and stable  Last Vitals:  Vitals:   09/06/16 2050 09/07/16 0653  BP: (!) 147/47 (!) 149/58  Pulse: 86 84  Resp: 18 18  Temp: 37 C 36.9 C    Last Pain:  Vitals:   09/07/16 0653  TempSrc: Oral  PainSc:       Patients Stated Pain Goal: 0 (09/05/16 2234)  Complications: No apparent anesthesia complications

## 2016-09-07 NOTE — Telephone Encounter (Signed)
Cxl'd lab for 09/17/16. Spoke to pt to inform him of change.

## 2016-09-07 NOTE — Progress Notes (Signed)
Pt received from PACU. Pt denies pain or needs. VSS.   Wound vac reading potential occlusion. Attempted to lower suction level and check seal, unsuccessful. Paged PA Sarita Bottom. Paged wound consult. Requested wound vac dressing change kit.   Fritz Pickerel, RN

## 2016-09-07 NOTE — Progress Notes (Signed)
Spoke with patient's wife. Very concerned r/t insurance coverage at discharge. States [employer] states they will cover IV abx materials, but not the Charity fundraiserN. Pt wife will leave message with [facility] rep Pam. Message left for CM. Will follow up during progression in morning. Pt's wife agreeable to that plan.   Leonidas Rombergaitlin S Bumbledare, RN

## 2016-09-07 NOTE — Consult Note (Addendum)
WOC Nurse wound consult note Reason for Consult: Consult requested for right groin Vac troubleshooting; machine is alarming "blockage." Wound type: Full thickness post-op wound; pt is followed by the vascular team.  There is a small amt bloody drainage in the Vac cannister which has clogged the filter.  Flushed with NS and cleared the cannister.  Removed Vac drape and track pad, there is a medium-sized blood clot underneath which was clogging the suction also.  Applied barrier ring to maintain seal to the right inner groin, then Vac drape and a new track pad.  Previous sponge was left in place in the wound bed.  Vascular PA at the bedside to assess and trouble shoot; when 100mm suction was turned back on, there was further small amt of bloody drainage underneath the track pad and in the tubing.  Discussed with PA that if the system clogs again, then Vac is probably not a good option. Bedside nurse plans to call the PA to notify if this re-occurs, and they will determine if wet-to dry dressing should be applied at that time. Please refer to vascular team for further questions. Please re-consult if further assistance is needed.  Thank-you,  Cammie Mcgeeawn Noralee Dutko MSN, RN, CWOCN, ArnoldWCN-AP, CNS [phone]

## 2016-09-07 NOTE — Anesthesia Postprocedure Evaluation (Signed)
Anesthesia Post Note  Patient: Robert [name]  Procedure(s) Performed: Procedure(s) (LRB): RIGHT GROIN WASHOUT WITH REMOVAL BEADS AND WOUND VAC PLACEMENT (Right)  Patient location during evaluation: PACU Anesthesia Type: General Level of consciousness: awake and alert Pain management: pain level controlled Vital Signs Assessment: post-procedure vital signs reviewed and stable Respiratory status: spontaneous breathing, nonlabored ventilation, respiratory function stable and patient connected to nasal cannula oxygen Cardiovascular status: blood pressure returned to baseline and stable Postop Assessment: no signs of nausea or vomiting Anesthetic complications: no       Last Vitals:  Vitals:   09/07/16 1041 09/07/16 1310  BP: (!) 152/63 (!) 147/50  Pulse: 77 95  Resp: 16 20  Temp: 36.6 C 36.8 C    Last Pain:  Vitals:   09/07/16 1310  TempSrc: Oral  PainSc:                  [name], [name]

## 2016-09-07 NOTE — Progress Notes (Signed)
Spoke with Robert [name] (VVS PA) to confirm wound vac orders for home. Confirmed that wound is 6x2x2. Dressing changes MWF beginning Friday. Wound vac to 100mm Hg.   Leonidas Rombergaitlin S Bumbledare, RN

## 2016-09-07 NOTE — Op Note (Signed)
    OPERATIVE NOTE   PROCEDURE: 1. Right groin washout and antibiotic beads removal 2. 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  15 out 20 absorbable beads removed 2.  No frank pus in seroma cavity 3.  No frank evidence of infection  SPECIMEN(S):  Anaerobic and aerobic culture of perigraft fluid   INDICATIONS:   Robert DeutscherRobert W Osland is a 66 y.o. male who presents with acute presentation of cellulitis in right groin with CT evidence of fluid collection adjacent to right aortofemoral limb.  The patient was taken back to OR previously for incision and drainage of the right groin fluid collection.  There was frank pus superficial to the aortofemoral limb.  I cultured this fluid and washed out the right groin.  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  I placed antibiotic beads in the seroma and then placed a wound VAC superficially.  After 10 days of antibiotic beads and IV antibiotics, I recommended removal of the antibiotic beads and reculture of the wound, followed by placement of VAC sponge to facilitate healing.  The patient is aware the risks included bleeding, infection, risks associated with general anesthesia, and need for additional procedures..   DESCRIPTION: After obtaining full informed written consent, the patient was brought back to the operating room and placed supine upon the operating table.  The patient received IV antibiotics prior to induction.  A procedure time out was completed and the correct surgical site was verified.  After obtaining adequate anesthesia, the patient was prepped and draped in the standard fashion for: right groin exploration.  The prior VAC dressing had been removed and the wound prepped with Betadine.  I  washed out this right groin with a Pulsavac briefly to removed the Betadine.  I then blunted dissected down to the previously closed seroma cavity.  I transected the prior Vicryl suture line and then exposed the prior seroma cavity.  There was no signs of infection with pink granulation tissue throughout.  I removed 15 out 20 antibiotic beads in variable degrees of integrity, which was expected given the absorbable nature of these beads.  I washed out the right groin seroma cavity with 3 L of sterile normal saline.  I then mechanically disrupted the seroma wall and then fulgurated the wall with some electrocautery.  I packed the wound with Avitene to control some surface bleeding.  I then reapproximated the deep tissue with interrupted stitches of 2-0 Vicryl immediately over the graft.  I then reapproximated the subcutaneous tissue with a running stitch of 3-0 Vicryl.  I fashioned a VAC sponge for the dimensions of the surgical incision.  I affixed this sponge with adhesive strips.  A hole was cut in the middle of the adhesive strips and the lilypad attached.  The VAC tubing was connected to the VAC pump at 125 continuous.  The VAC dressing became adherent without an air leak.   COMPLICATIONS: none  CONDITION: stable   Leonides SakeBrian Lovene Maret, MD, [facility] [place] Office: [phone] Pager: [phone]  09/07/2016, 9:19 AM

## 2016-09-07 NOTE — Telephone Encounter (Signed)
-----   Message from Sharee PimpleMarilyn K McChesney, RN sent at 09/07/2016 11:47 AM EDT ----- Regarding: RE: June 8 appt Just change the wording on Dr. Nicky Pughhen's appt to read postop groin exploration. He had vascular lab while in the hospital this time.    ----- Message ----- From: Jena Gaussoczniak, Michele A Sent: 09/07/2016  11:32 AM To: Sharee PimpleMarilyn K McChesney, RN Subject: RE: June 8 appt                                Shall I cxl the 09/17/16 appt?  ----- Message ----- From: Sharee PimpleMcChesney, Marilyn K, RN Sent: 09/07/2016   9:49 AM To: Donita BrooksVvs[facility] Admin Pool Subject: June 8 appt                                      ----- Message ----- From: Fransisco Hertzhen, Brian L, MD Sent: 09/07/2016   9:29 AM To: Vvs Charge [address]  Clide DeutscherRobert W Laino [id number] Sep 14, 1950   PROCEDURE: 1. Right groin washout and antibiotic beads removal 2. Negative pressure dressing placement  Follow-up: 8 JUN 18 for wound check

## 2016-09-07 NOTE — Progress Notes (Signed)
Wound vac alarming "occlusion". Spoke with Lester KinsmanSam Ellington VVS PA. Given verbal order to discontinue wound vac and begin wet to dry dressing, change PRN. VVS team will re-evaluate in the morning. Pt updated as to plan of care, verbalized understanding.   Leonidas Rombergaitlin S Bumbledare, RN

## 2016-09-07 NOTE — Anesthesia Procedure Notes (Signed)
Procedure Name: Intubation Date/Time: 09/07/2016 8:28 AM Performed by: Rosiland OzMEYERS, Abreanna Drawdy Pre-anesthesia Checklist: Patient identified, Emergency Drugs available, Suction available, Timeout performed and Patient being monitored Patient Re-evaluated:Patient Re-evaluated prior to inductionOxygen Delivery Method: Circle system utilized Preoxygenation: Pre-oxygenation with 100% oxygen Intubation Type: IV induction Ventilation: Mask ventilation without difficulty Laryngoscope Size: Miller and 3 Grade View: Grade I Tube type: Oral Tube size: 7.5 mm Number of attempts: 1 Airway Equipment and Method: Stylet Placement Confirmation: ETT inserted through vocal cords under direct vision,  positive ETCO2 and breath sounds checked- equal and bilateral Secured at: 22 cm Tube secured with: Tape Dental Injury: Teeth and Oropharynx as per pre-operative assessment

## 2016-09-07 NOTE — Interval H&P Note (Signed)
History and Physical Interval Note:  09/07/2016 8:08 AM  Robert [name]  has presented today for surgery, with the diagnosis of right groin abscess  The various methods of treatment have been discussed with the patient and family. After consideration of risks, benefits and other options for treatment, the patient has consented to  Procedure(s): RIGHT GROIN WASHOUT WITH REMOVAL BEADS AND WOUND VAC PLACEMENT (Right) as a surgical intervention .  The patient's history has been reviewed, patient examined, no change in status, stable for surgery.  I have reviewed the patient's chart and labs.  Questions were answered to the patient's satisfaction.     Leonides SakeBrian Cortnee Steinmiller

## 2016-09-08 ENCOUNTER — Encounter (HOSPITAL_COMMUNITY): Payer: Self-pay | Admitting: Vascular Surgery

## 2016-09-08 MED ORDER — TRAMADOL HCL 50 MG PO TABS: 50.0000 mg | ORAL_TABLET | Freq: Four times a day (QID) | ORAL | 0 refills | Status: AC | PRN

## 2016-09-08 MED ORDER — PENICILLIN G POTASSIUM IV (FOR PTA / DISCHARGE USE ONLY): 18.0000 10*6.[IU] | Freq: Every day | INTRAVENOUS | 0 refills | Status: AC

## 2016-09-08 NOTE — Discharge Summary (Signed)
AAA Discharge Summary    Robert [name] September 24, 1950 66 y.o. 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 male who is s/p ABF bypass graft complicated by ARF sec. ARB/hypotension, UHR, L iliofem BPA (Date: 05/21/15) by Dr. Bridgett Larsson.   He returns today at request of Dr. Maceo Pro. Dr. Maceo Pro spoke with VVS triage nurse, said that there is an area in his left groin that is sore and somewhat red. There is no drainage and Mr. Milanes fever is back up to 101.2. He is going to start him on some Augmentin today (yesterday), Rocephin 1 gm given IM in [place] walk in in clinic yesterday; he asked that we see him tomorrow because he can't rule out if this is just superficial or is communicating with the graft.  Dr. Bridgett Larsson last evaluated pt on 09-12-15. At that time both groins were healed and feet were warm with faintly palpable pulses in feet. Dr. Bridgett Larsson advised follow up in one year of aortoiliac duplex and BLE ABI.  Pt denies any claudication symptoms in his legs with walking, denies any history of stroke or TIA.   Hospital Course:  The patient was admitted to the hospital and taken to the operating room on 08/27/2016 and underwent: 1. Incision and drainage of right groin abscess 2. Placement of antibiotic impregnated bead in abscess cavity Negative pressure dressing    The pt tolerated the procedure well and was transported to the PACU in good condition.   Intraoperative findings as follows: 1.  Densely scarred tissue superficial to the abscess cavity 2.  ~100 cc of pus adjacent to right aortobifemoral limb 3.  No evidence of compromise of right aortofemoral anastomosis  Pt was placed on Vancomycin/Zosyn per pharmacy.  His gram stain revealed frew GPC in chains.  His wound cx grew Steptococcus agalactiae.      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 So far blood cultures have NOT demonstrated any bacteremia ? Pt only started having sx on Tuesday so hopefully can sterilize wound with antibiotic beads and sterilize rest of blood stream with antibiotics ? Will get ID's assistance with abx regimen and long-term suppression regimen   ID was consulted.  Given the results of the cx, Dr. Baxter Flattery wanted to discontinue Vanc/Zosyn and start Cefazolin 2g IV q8h.  The long term plan is to trx with 6 weeks of IV continuous PenG then transition to oral abx on follow up.  He did have a PICC line placed.    His blood cx were negative.    On 09/03/16, his wound was inspected and clean and dry with good granulation tissue.  Pt continued to be afebrile.   On 09/07/16, the pt was taken to the operating room and underwent: 3. Right groin washout and antibiotic beads removal 4. Negative pressure dressing placement  Intraoperative findings as follows: 1.  15 out 20 absorbable beads removed 2.  No frank pus in seroma cavity 3.  No frank evidence of infection  Aerobic and anaerobic cx were taken.  Final cx did not have any growth aerobically or anaerobically.   The remainder of the hospital course consisted of increasing mobilization and increasing intake of solids without difficulty.  That afternoon, the wound vac had clots in the tubing.  This was  changed and it continued.  His wound vac was removed and a wet to dry dressing placed.  By the next morning, the wound was clean and the vac was replaced.   CBC    Component Value Date/Time   WBC 10.7 (H) 09/07/2016 0402   RBC 4.05 (L) 09/07/2016 0402   HGB 12.0 (L) 09/07/2016 0402   HCT 36.8 (L) 09/07/2016 0402   PLT 277 09/07/2016 0402   MCV 90.9 09/07/2016 0402   MCH 29.6 09/07/2016 0402   MCHC 32.6 09/07/2016 0402   RDW 13.0 09/07/2016 0402    BMET    Component Value Date/Time   NA 138  09/07/2016 0402   K 3.8 09/07/2016 0402   CL 101 09/07/2016 0402   CO2 28 09/07/2016 0402   GLUCOSE 98 09/07/2016 0402   BUN 27 (H) 09/07/2016 0402   CREATININE 1.59 (H) 09/07/2016 0402   CALCIUM 9.4 09/07/2016 0402   GFRNONAA 44 (L) 09/07/2016 0402   GFRAA 51 (L) 09/07/2016 0402     Discharge Instructions    Call MD for:  redness, tenderness, or signs of infection (pain, swelling, bleeding, redness, odor or green/yellow discharge around incision site)    Complete by:  As directed    Call MD for:  severe or increased pain, loss or decreased feeling  in affected limb(s)    Complete by:  As directed    Call MD for:  temperature >100.5    Complete by:  As directed    Discharge instructions    Complete by:  As directed    No mowing the lawn or cleaning the pool until at least coming back to see Dr. Bridgett Larsson.   Discharge wound care:    Complete by:  As directed    Change wound vac M/W/F starting on 09/10/16 by [facility].   Driving Restrictions    Complete by:  As directed    No driving for 2 weeks.   Home infusion instructions [facility] May follow [place] Dosing Protocol; May administer Cathflo as needed to maintain patency of vascular access device.; Flushing of vascular access device: per [facility] Protocol: 0.9% NaCl pre/post medica...    Complete by:  As directed    Instructions:  May follow [place] Dosing Protocol   Instructions:  May administer Cathflo as needed to maintain patency of vascular access device.   Instructions:  Flushing of vascular access device: per [facility] Protocol: 0.9% NaCl pre/post medication administration and prn patency; Heparin 100 u/ml, 46m for implanted ports and Heparin 10u/ml, 54mfor all other central venous catheters.   Instructions:  May follow [facility] Anaphylaxis Protocol for First Dose Administration in the home: 0.9% NaCl at 25-50 ml/hr to maintain IV access for protocol meds. Epinephrine 0.3 ml IV/IM PRN and Benadryl 25-50 IV/IM PRN s/s of anaphylaxis.    Instructions:  [facility] Coordinator (RN) to assist per patient IV care needs in the home PRN.   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 Aortic stenosis 05/21/2015  . Atherosclerosis of native artery of left lower extremity with intermittent claudication ([place]01/09/2015  .  Atherosclerosis of native artery of right lower extremity with intermittent claudication ([place]) 04/18/2015  . 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 Hypertension 04/10/2015  . Hyperlipidemia 04/10/2015  . Hypertriglyceridemia 04/10/2015  . CKD (chronic kidney disease) stage 3, GFR 30-59 ml/min 04/10/2015  . Glaucoma 04/10/2015  . Atherosclerosis of native arteries of extremity with intermittent claudication ([place]) 11/01/2014   Past Medical History:  Diagnosis Date  . Cataracts, bilateral    immature  . Chronic back pain    ruptured disc  . Chronic kidney disease    Stage III  Moderate  . Glaucoma    uses eye drops nightly  . History of bronchitis as a child   . Hyperlipidemia    takes Tricor and Vytorin daily  . Hypertension    takes Diovan HCT daily  . Hypertension 04/10/2015  . Nocturia   . Peripheral vascular disease ([place])      Allergies as of 09/08/2016      Reactions   Valsartan-hydrochlorothiazide Other (See Comments)   Renal failure      Medication List    TAKE these medications   aspirin 81 MG tablet Take 81 mg by mouth daily.   ciclopirox 0.77 % cream Commonly known as:  LOPROX Apply 1 application topically 2 (two) times daily as needed. Gel   diphenhydrAMINE 25 mg capsule Commonly known as:  BENADRYL Take 25 mg by mouth at  bedtime.   ezetimibe-simvastatin 10-40 MG tablet Commonly known as:  VYTORIN Take 1 tablet by mouth daily.   fenofibrate 145 MG tablet Commonly known as:  TRICOR Take 145 mg by mouth daily.   hydrochlorothiazide 25 MG tablet Commonly known as:  HYDRODIURIL Take 25 mg by mouth every morning.   penicillin G IVPB Inject 18 Million Units into the vein daily. Infuse 18 million units daily of PCN G as continuous infusion Indication:  Group B Strep abscess of right ABF graft Last Day of Therapy:  10/10/2016 Labs - Once weekly:  CBC/D and BMP, Labs - Every other week:  ESR and CRP   SODIUM CHLORIDE OP Place 1 drop into both eyes 2 (two) times daily.   traMADol 50 MG tablet Commonly known as:  ULTRAM Take 1 tablet (50 mg total) by mouth every 6 (six) hours as needed for moderate pain. For PAIN   PRN            Home Infusion Instuctions        Start     Ordered   09/08/16 0000  Home infusion instructions [facility] May follow [place] Dosing Protocol; May administer Cathflo as needed to maintain patency of vascular access device.; Flushing of vascular access device: per [facility] Protocol: 0.9% NaCl pre/post medica...    Question Answer Comment  Instructions May follow [place] Dosing Protocol   Instructions May administer Cathflo as needed to maintain patency of vascular access device.   Instructions Flushing of vascular access device: per [facility] Protocol: 0.9% NaCl pre/post medication administration and prn patency; Heparin 100 u/ml, 49m for implanted ports and Heparin 10u/ml, 552mfor all other central venous catheters.   Instructions May follow [facility] Anaphylaxis Protocol for First Dose Administration in the home: 0.9% NaCl at 25-50 ml/hr to maintain IV access for protocol meds. Epinephrine 0.3 ml IV/IM PRN and Benadryl 25-50 IV/IM PRN s/s of anaphylaxis.   Instructions [facility] Infusion Coordinator (RN) to assist per patient IV care needs in the home PRN.  09/08/16  1048      Prescriptions given: 1.  Tramadol #20 No Refill 2.  Pen G 18 million units #41 No Refill   Instructions: 1.  No driving x 2 weeks and while taking pain medications 2.  No heavy lifting x 4 weeks. 3.  No mowing the lawn or cleaning the pool until returning to see Dr. Bridgett Larsson. 4.  Wound vac M/W/F 5.  Pen G 18 Million units daily continuous.  Disposition: home  Patient's condition: is Good  Follow up: 1. Dr. Bridgett Larsson on 09/17/16   Leontine Locket, PA-C [facility] [phone] 09/08/2016  10:51 AM    Addendum  This patient had previous had a ABF that was complicated by acute renal failure related to ARB/Hypotension.  His renal function eventually recovered but he had some right groin complications that required a VAC dressing to heal. Reportedly on Tuesday, 08/25/16, the patient had sudden onset of pain in right groin with erythema in the right groin.  He was seen at his PCP and placed on antibiotics.  I saw the patient on Friday, 08/27/16, I felt the patient likely had a groin infection, so he was admitted to the hospital.  He was started immediately on IV abx with concern for right aortobifemoral limb infection.  A CT scan verified a fluid collection in the proximity of the right limb.  The patient had already eaten at the hospital so I had to wait until Saturday to explore the right groin.  The majority of the groin was well scarred but there was pocket of pus immediately superficial to the graft.  Wound culture demonstrated a strep infection that was sensitive to PCN, cephalosporins and Vancomycin.  I washed out the abscess cavity and then placed abx beads impregnated with Vancomycin and Gentamicin.  I closed one layer on top the beads and applied a VAC dressing.  The patient remained in the hospital on IV abx and abx beads in the wound.  His blood cultures never demonstrated active bacteremia.  I took the patient back to OR on 09/07/16 to removed the beads.  The wound  appeared to be clean with contracture of the wound since the initial exploration.  I removed 15 out 20 beads, with evidence of absorption of 5+ beads.  Repeat wound cultures would eventually demonstrate no growth.  The wound was reapproximated in layers and a VAC applied to the superficial layer.  The patient was discharged home the next day with home IV abx per ID and VAC dressing.  The patient will follow up in the office in 2 weeks for wound check.   Adele Barthel, MD, FACS [facility] [place] Office: [phone] Pager: [phone]  09/14/2016, 1:32 PM

## 2016-09-08 NOTE — Progress Notes (Signed)
Discussed with the patient and all questioned fully answered. He will call me if any problems arise.  [facility]. Pt on home wound vac. Pt on home penicillin infusion.   Leonidas Rombergaitlin S Bumbledare, RN

## 2016-09-08 NOTE — Progress Notes (Signed)
LM with Jeri ModenaPam Chandler and Clydie BraunKaren with [facility] for updates in process of setting up home health servoces, IV Abx, and wound vac. Waiting on call back

## 2016-09-08 NOTE — Progress Notes (Addendum)
  Progress Note    09/08/2016 7:38 AM 1 Day Post-Op  Subjective:  No complaints.  Best night he's had  Afebrile 130's-140's systolic HR 70's-80's  99% RA  Vitals:   09/07/16 2036 09/08/16 0427  BP: (!) 149/56 (!) 130/46  Pulse: 83 79  Resp: 18 18  Temp: 98.6 F (37 C) 98.4 F (36.9 C)    Physical Exam: Lungs:  Non labored Incisions:  Wound is clean and dry; no further oozing   CBC    Component Value Date/Time   WBC 10.7 (H) 09/07/2016 0402   RBC 4.05 (L) 09/07/2016 0402   HGB 12.0 (L) 09/07/2016 0402   HCT 36.8 (L) 09/07/2016 0402   PLT 277 09/07/2016 0402   MCV 90.9 09/07/2016 0402   MCH 29.6 09/07/2016 0402   MCHC 32.6 09/07/2016 0402   RDW 13.0 09/07/2016 0402    BMET    Component Value Date/Time   NA 138 09/07/2016 0402   K 3.8 09/07/2016 0402   CL 101 09/07/2016 0402   CO2 28 09/07/2016 0402   GLUCOSE 98 09/07/2016 0402   BUN 27 (H) 09/07/2016 0402   CREATININE 1.59 (H) 09/07/2016 0402   CALCIUM 9.4 09/07/2016 0402   GFRNONAA 44 (L) 09/07/2016 0402   GFRAA 51 (L) 09/07/2016 0402    INR    Component Value Date/Time   INR 1.06 09/07/2016 0402     Intake/Output Summary (Last 24 hours) at 09/08/16 0738 Last data filed at 09/07/16 2037  Gross per 24 hour  Intake             1220 ml  Output               90 ml  Net             1130 ml   Specimen Description ABSCESS RIGHT GROIN   Special Requests NONE   Gram Stain FEW WBC PRESENT,BOTH PMN AND MONONUCLEAR  NO ORGANISMS SEEN      Culture PENDING   Report Status PENDING      Assessment:  66 y.o. male is s/p:  I&D R groin abscess, VAC placement in setting ABF  11 Days Post-Op and 1. Right groin washout and antibiotic beads removal Negative pressure dressing placement  1 Day Post-Op  Plan: -pt doing well this morning-wound is clean.  Wet to dry dressing placed. -unfortunately, the insurance company will pay for the infusion but not the nurse needed for infusion or wound vac per the  RN.  I have asked her to speak with CM about this and possibly change companies as pt is going to need RN at home. -no organisms seen on gram stain; few WBC present-cx pending -continue IV abx -DVT prophylaxis:  SQ heparin    Doreatha MassedSamantha Rhyne, PA-C [facility] [phone] 09/08/2016 7:38 AM  Addendum  I have independently interviewed and examined the patient, and I agree with the physician assistant's findings.  No further active bleeding.  No too surprised with bleeding given seroma cavity was mechanically debrided and fulgurated.  There was NO bleeding from the anastomosis.    - Continue home IV abx per ID - Outpt Labwork per ID - VAC: M-W-F - Seroma cavity recultured: suspect will be sterile given the lack of findings intraop - Wound check next Friday   Leonides SakeBrian Saundra Gin, MD, FACS [facility] [place] Office: [phone] Pager: [phone]  09/08/2016, 10:54 AM

## 2016-09-08 NOTE — Progress Notes (Addendum)
[facility] services, patient after [facility] and hearing they are out of network will not use [facility]. [facility], [facility], nor [facility] able to accept[facility]  13:15 Spoke with patient's wife. She updated me that [facility] Plan is for DC today. Home Vac and PCN G [facility] him leaving the hospital. Jeri ModenaPam Chandler will work with wife to explain IV meds prior to DC. HH to start tomorrow.

## 2016-09-12 LAB — AEROBIC/ANAEROBIC CULTURE (SURGICAL/DEEP WOUND): CULTURE: NO GROWTH

## 2016-09-12 LAB — AEROBIC/ANAEROBIC CULTURE W GRAM STAIN (SURGICAL/DEEP WOUND)

## 2016-09-13 NOTE — Progress Notes (Signed)
    Postoperative Visit   History of Present Illness   Robert [name] is a 66 y.o. year old male who presents for postoperative follow-up for:  Procedures: 1.   Rt groin washout and abx beads removal, VAC placement (09/07/16) 2.  I&D R groin abscess , placement abx bead, VAC placement (08/28/16) 3.  ABF, B iliofem EA, L iliofem BPA, UHR (05/21/15)  Wound cultures (08/28/16): Strep. Agalactiae (sens PCN, cephalosporin, Vanco) Blood cultures x 2 (08/27/16): negative  Wound cultures (09/07/16): negative  Pt is getting home IV abx and VAC dressings (M-W-F).  Pt denies any pain or bleeding from R groin.  He denies any fever or chills.  For VQI Use Only   PRE-ADM LIVING: Home  AMB STATUS: Ambulatory   Physical Examination   Vitals:   09/17/16 0924  BP: (!) 154/81  Pulse: 82  Resp: 20  Temp: 98.6 F (37 C)  TempSrc: Oral  SpO2: 96%  Weight: 190 lb 8 oz (86.4 kg)  Height: 5\' 9"  (1.753 m)    R groin: good granulation, contraction >75%, no drainage, some skin irritation surrounding wound consistent with taper injuries   Medical Decision Making   Robert [name] is a 66 y.o. year old male who presents s/p I&D R groin infected seroma, placement of abx bead, removal of beads, VAC placement in setting of ABF.   Fortunately, appears R seroma infection was sterilized in-situ with abx beads and no bacteremia evident on blood cultures.  Finish IV abx regimen as precaution under ID's guidance  R groin has contracted and healed rapidly ? d/c VAC, W?D BID  Wound check on 29 JUN 18  Leonides SakeBrian Chen, MD, [facility] [place] Office: [phone] Pager: [phone]

## 2016-09-13 NOTE — Addendum Note (Signed)
Addendum  created 09/13/16 1537 by Kjerstin Abrigo, MD   Sign clinical note    

## 2016-09-14 ENCOUNTER — Encounter: Payer: Self-pay | Admitting: Vascular Surgery

## 2016-09-17 ENCOUNTER — Encounter (HOSPITAL_COMMUNITY): Payer: 59

## 2016-09-17 ENCOUNTER — Ambulatory Visit (INDEPENDENT_AMBULATORY_CARE_PROVIDER_SITE_OTHER): Payer: 59 | Admitting: Vascular Surgery

## 2016-09-17 ENCOUNTER — Encounter: Payer: Self-pay | Admitting: Vascular Surgery

## 2016-09-17 VITALS — BP 154/81 | HR 82 | Temp 98.6°F | Resp 20 | Ht 69.0 in | Wt 190.5 lb

## 2016-09-17 DIAGNOSIS — T827XXD Infection and inflammatory reaction due to other cardiac and vascular devices, implants and grafts, subsequent encounter: Secondary | ICD-10-CM

## 2016-09-22 ENCOUNTER — Telehealth: Payer: Self-pay | Admitting: Vascular Surgery

## 2016-09-22 NOTE — Telephone Encounter (Signed)
Per Dr. Nicky Pughhen's orders, I called and sent an Rx to discontinue wound vac and apply wet to dry dressing/changes to affected area daily.    Ernst SpellAshleigh E., LPN

## 2016-09-23 ENCOUNTER — Telehealth: Payer: Self-pay

## 2016-09-24 ENCOUNTER — Encounter: Payer: Self-pay | Admitting: Vascular Surgery

## 2016-10-07 NOTE — Progress Notes (Signed)
    Postoperative Visit   History of Present Illness   Robert DeutscherRobert W Egge is a 66 y.o. (28-Oct-1950) male who presents for postoperative follow-up for:  Procedures: 1.   Rt groin washout and abx beads removal, VAC placement (09/07/16) 2.  I&D R groin abscess , placement abx bead, VAC placement (08/28/16) 3.  ABF, B iliofem EA, L iliofem BPA, UHR (05/21/15)  Wound cultures (08/28/16): Strep. Agalactiae (sens PCN, cephalosporin, Vanco) Blood cultures x 2 (08/27/16): negative  Wound cultures (09/07/16): negative  R groin is essentially healed.  Pt denies any pain or bleeding from R groin.  He denies any fever or chills.   Physical Examination   Vitals:   10/08/16 1354 10/08/16 1357  BP: (!) 169/86 (!) 166/79  Pulse: 90   Resp: 20   Temp: 98.1 F (36.7 C)   TempSrc: Oral   SpO2: 94%   Weight: 192 lb (87.1 kg)   Height: 5\' 9"  (1.753 m)    Body mass index is 28.35 kg/m.  R groin: hypertrophic tissue at incision, but essential near complete seal of R groin incision   Medical Decision Making   Robert DeutscherRobert W Minion is a 66 y.o. (28-Oct-1950) male who presents s/p I&D R groin infected seroma, placement of abx bead, removal of beads, VAC placement in setting of ABF.   Applied silver nitrate to hypertrophic tissue  Wash right groin with soap and water daily  Sterile bandage to right groin daily  Follow up in 2 months with a R groin ultrasound to see if any residual fluid   Leonides SakeBrian Ivee Poellnitz, MD, FACS [facility] [place] Office: [phone] Pager: [phone]

## 2016-10-08 ENCOUNTER — Encounter: Payer: Self-pay | Admitting: Vascular Surgery

## 2016-10-08 ENCOUNTER — Ambulatory Visit (INDEPENDENT_AMBULATORY_CARE_PROVIDER_SITE_OTHER): Payer: Self-pay | Admitting: Vascular Surgery

## 2016-10-08 VITALS — BP 166/79 | HR 90 | Temp 98.1°F | Resp 20 | Ht 69.0 in | Wt 192.0 lb

## 2016-10-08 DIAGNOSIS — T827XXD Infection and inflammatory reaction due to other cardiac and vascular devices, implants and grafts, subsequent encounter: Secondary | ICD-10-CM

## 2016-10-11 ENCOUNTER — Encounter: Payer: Self-pay | Admitting: Internal Medicine

## 2016-10-11 ENCOUNTER — Ambulatory Visit (INDEPENDENT_AMBULATORY_CARE_PROVIDER_SITE_OTHER): Payer: Medicare Other | Admitting: Internal Medicine

## 2016-10-11 VITALS — BP 170/78 | HR 93 | Temp 97.7°F | Wt 192.0 lb

## 2016-10-11 DIAGNOSIS — I745 Embolism and thrombosis of iliac artery: Secondary | ICD-10-CM

## 2016-10-11 DIAGNOSIS — T827XXS Infection and inflammatory reaction due to other cardiac and vascular devices, implants and grafts, sequela: Secondary | ICD-10-CM

## 2016-10-11 MED ORDER — AMOXICILLIN 500 MG PO CAPS: 500.0000 mg | ORAL_CAPSULE | Freq: Three times a day (TID) | ORAL | 4 refills | Status: DC

## 2016-10-11 NOTE — Progress Notes (Signed)
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   MRN: [id number]  HPI Robert [name] is a 66yo M with group b strep abscess of right ABF graft s/p I x D. Cultures grew group B strep and has been on continuous penicillin since end of May.has been on 6 wk of IV penicillin doing well. Last saw dr Robert [name] last Friday. Wound is healng well. Only slight serous drainage.nolonger open- no longer needs wet to dry .   On 5/19 he underwent right exploration I&D of right groin of infected ABF bypass graft by Dr. Bridgett [name]; ~100cc pus was evacuated from abscess and sent for culture. Vancomycin beads were inserted and VAC sponge placed in surgical incision.   BCx 5/18 >> 2/2 NGTD (received abx prior to) R Wound Cx 5/19 >> GBS, sens pending Nasal PCR >> MSSA   No diarrhea, rash  + sun -wants to go swimming  Outpatient Encounter Prescriptions as of 10/11/2016  Medication Sig  . aspirin 81 MG tablet Take 81 mg by mouth daily.  . ciclopirox (LOPROX) 0.77 % cream Apply 1 application topically 2 (two) times daily as needed. Gel  . diphenhydrAMINE (BENADRYL) 25 mg capsule Take 25 mg by mouth at bedtime.  Marland Kitchen ezetimibe-simvastatin (VYTORIN) 10-40 MG per tablet Take 1 tablet by mouth daily.  . fenofibrate (TRICOR) 145 MG tablet Take 145 mg by mouth daily.  . hydrochlorothiazide (HYDRODIURIL) 25 MG tablet Take 25 mg by mouth every morning.  . penicillin G IVPB Inject 18 Million Units into the vein daily. 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 Sodium Chloride, Hypertonic, (SODIUM CHLORIDE OP) Place 1 drop into both eyes 2 (two) times daily.  . traMADol (ULTRAM) 50 MG tablet Take 1 tablet (50 mg total) by  mouth every 6 (six) hours as needed for moderate pain. For PAIN   PRN   No facility-administered encounter medications on file as of 10/11/2016.      Patient Active Problem List   Diagnosis Date Noted  . 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 Aortic stenosis 05/21/2015  . Atherosclerosis of native artery of left lower extremity with intermittent claudication ([place]) 04/18/2015  . Atherosclerosis of native artery of right lower extremity with intermittent claudication ([place]) 04/18/2015  . 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 Hypertension 04/10/2015  . Hyperlipidemia 04/10/2015  . Hypertriglyceridemia 04/10/2015  . CKD (chronic kidney disease) stage 3, GFR 30-59 ml/min 04/10/2015  . Glaucoma 04/10/2015  . Atherosclerosis of native arteries of extremity with intermittent claudication ([place]) 11/01/2014     Health Maintenance Due  Topic Date Due  . Hepatitis C Screening  1950/12/04  . TETANUS/TDAP  11/06/1970  . COLONOSCOPY  11/05/2001    Social History  Substance Use Topics  . Smoking status: Former Research scientist (life sciences)  . Smokeless tobacco: Never Used     Comment: quit smoking 11 yrs ago  . Alcohol use 1.8 oz/week    3 Cans of beer per week     Comment: couple of beers a day   Review of Systems 12 point ros is negative Physical Exam   BP (!) 170/78  Pulse 93   Temp 97.7 F (36.5 C) (Oral)   Wt 192 lb (87.1 kg)   BMI 28.35 kg/m    Physical Exam  Constitutional: He is oriented to person, place, and time. He appears well-developed and well-nourished. No distress.  HENT:  Mouth/Throat: Oropharynx is clear and moist. No oropharyngeal exudate.  Cardiovascular: Normal rate, regular rhythm and normal heart sounds. Exam reveals no gallop and no friction rub.  No murmur heard.  Pulmonary/Chest: Effort normal and breath sounds normal. No respiratory distress. He has no wheezes.  Chest wall: central catheter in place no surrounding erythema. Abdominal: Soft. Bowel sounds are  normal. He exhibits no distension. There is no tenderness.  Skin: right inguinal fold incision is closed still some serous drainage on the bandage Psychiatric: He has a normal mood and affect. His behavior is normal.    Lab Results  Component Value Date   HEPBSAB Non Reactive 05/23/2015   No results found for: RPR, LABRPR  CBC Lab Results  Component Value Date   WBC 10.7 (H) 09/07/2016   RBC 4.05 (L) 09/07/2016   HGB 12.0 (L) 09/07/2016   HCT 36.8 (L) 09/07/2016   PLT 277 09/07/2016   MCV 90.9 09/07/2016   MCH 29.6 09/07/2016   MCHC 32.6 09/07/2016   RDW 13.0 09/07/2016    BMET Lab Results  Component Value Date   NA 138 09/07/2016   K 3.8 09/07/2016   CL 101 09/07/2016   CO2 28 09/07/2016   GLUCOSE 98 09/07/2016   BUN 27 (H) 09/07/2016   CREATININE 1.59 (H) 09/07/2016   CALCIUM 9.4 09/07/2016   GFRNONAA 44 (L) 09/07/2016   GFRAA 51 (L) 09/07/2016   Lab Results  Component Value Date   ESRSEDRATE 70 (H) 08/30/2016   Lab Results  Component Value Date   CRP 6.9 (H) 08/30/2016   Reports showing sed rate 22   Assessment and Plan  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 Will arrange for IR to pull his central line then we  Will switch to amox 558m TID for remaining 5 months  rtc in 3 months

## 2016-10-12 ENCOUNTER — Ambulatory Visit (HOSPITAL_COMMUNITY)
Admission: RE | Admit: 2016-10-12 | Discharge: 2016-10-12 | Disposition: A | Discharge status: Home / Self Care | Payer: Medicare Other | Source: Ambulatory Visit | Attending: Internal Medicine | Admitting: Internal Medicine

## 2016-10-12 ENCOUNTER — Encounter (HOSPITAL_COMMUNITY): Payer: Self-pay | Admitting: Interventional Radiology

## 2016-10-12 DIAGNOSIS — N183 Chronic kidney disease, stage 3 (moderate): Secondary | ICD-10-CM | POA: Diagnosis not present

## 2016-10-12 DIAGNOSIS — Z452 Encounter for adjustment and management of vascular access device: Secondary | ICD-10-CM | POA: Insufficient documentation

## 2016-10-12 DIAGNOSIS — E781 Pure hyperglyceridemia: Secondary | ICD-10-CM | POA: Insufficient documentation

## 2016-10-12 DIAGNOSIS — T827XXA Infection and inflammatory reaction due to other cardiac and vascular devices, implants and grafts, initial encounter: Secondary | ICD-10-CM | POA: Diagnosis not present

## 2016-10-12 DIAGNOSIS — Z87891 Personal history of nicotine dependence: Secondary | ICD-10-CM | POA: Diagnosis not present

## 2016-10-12 DIAGNOSIS — I70213 Atherosclerosis of native arteries of extremities with intermittent claudication, bilateral legs: Secondary | ICD-10-CM | POA: Diagnosis not present

## 2016-10-12 DIAGNOSIS — E785 Hyperlipidemia, unspecified: Secondary | ICD-10-CM | POA: Insufficient documentation

## 2016-10-12 DIAGNOSIS — I129 Hypertensive chronic kidney disease with stage 1 through stage 4 chronic kidney disease, or unspecified chronic kidney disease: Secondary | ICD-10-CM | POA: Diagnosis not present

## 2016-10-12 DIAGNOSIS — Y832 Surgical operation with anastomosis, bypass or graft as the cause of abnormal reaction of the patient, or of later complication, without mention of misadventure at the time of the procedure: Secondary | ICD-10-CM | POA: Insufficient documentation

## 2016-10-12 DIAGNOSIS — Z7982 Long term (current) use of aspirin: Secondary | ICD-10-CM | POA: Insufficient documentation

## 2016-10-12 DIAGNOSIS — T827XXS Infection and inflammatory reaction due to other cardiac and vascular devices, implants and grafts, sequela: Secondary | ICD-10-CM

## 2016-10-12 DIAGNOSIS — B951 Streptococcus, group B, as the cause of diseases classified elsewhere: Secondary | ICD-10-CM | POA: Diagnosis not present

## 2016-10-12 DIAGNOSIS — Z79899 Other long term (current) drug therapy: Secondary | ICD-10-CM | POA: Diagnosis not present

## 2016-10-12 HISTORY — PX: IR REMOVAL TUN CV CATH W/O FL: IMG2289

## 2016-10-19 DIAGNOSIS — H401134 Primary open-angle glaucoma, bilateral, indeterminate stage: Secondary | ICD-10-CM | POA: Diagnosis not present

## 2016-10-19 NOTE — Addendum Note (Signed)
Addended by: Burton ApleyPETTY, Leland Staszewski A on: 10/19/2016 11:03 AM   Modules accepted: Orders

## 2016-11-19 DIAGNOSIS — H401131 Primary open-angle glaucoma, bilateral, mild stage: Secondary | ICD-10-CM | POA: Diagnosis not present

## 2016-12-08 ENCOUNTER — Encounter: Payer: Self-pay | Admitting: Internal Medicine

## 2016-12-15 ENCOUNTER — Encounter: Payer: Self-pay | Admitting: Internal Medicine

## 2016-12-15 ENCOUNTER — Ambulatory Visit (INDEPENDENT_AMBULATORY_CARE_PROVIDER_SITE_OTHER): Payer: Medicare Other | Admitting: Internal Medicine

## 2016-12-15 VITALS — BP 158/79 | HR 80 | Temp 98.4°F | Wt 196.0 lb

## 2016-12-15 DIAGNOSIS — I745 Embolism and thrombosis of iliac artery: Secondary | ICD-10-CM

## 2016-12-15 DIAGNOSIS — T827XXS Infection and inflammatory reaction due to other cardiac and vascular devices, implants and grafts, sequela: Secondary | ICD-10-CM | POA: Diagnosis not present

## 2016-12-15 NOTE — Progress Notes (Signed)
Patient ID: Robert [name], male   DOB: July 07, 1950, 66 y.o.   MRN: [id number]  HPI Mr Robert [name] is a 66yo M with group b strep abscess of right ABF graft s/p I x D. Cultures grew group B strep and has been on continuous penicillin since end of May x 6 wk then converted to oral amoxicillin. Wound is well healed. Not having any discomfort from healed wound. No rash, diarrhea or thrush associated with abtx. Denies any fever/chills/nightsweats  Outpatient Encounter Prescriptions as of 12/15/2016  Medication Sig  . amoxicillin (AMOXIL) 500 MG capsule Take 1 capsule (500 mg total) by mouth 3 (three) times daily.  Marland Kitchen. aspirin 81 MG tablet Take 81 mg by mouth daily.  . ciclopirox (LOPROX) 0.77 % cream Apply 1 application topically 2 (two) times daily as needed. Gel  . diphenhydrAMINE (BENADRYL) 25 mg capsule Take 25 mg by mouth at bedtime.  Marland Kitchen. ezetimibe-simvastatin (VYTORIN) 10-40 MG per tablet Take 1 tablet by mouth daily.  . fenofibrate (TRICOR) 145 MG tablet Take 145 mg by mouth daily.  . hydrochlorothiazide (HYDRODIURIL) 25 MG tablet Take 25 mg by mouth every morning.  . traMADol (ULTRAM) 50 MG tablet Take 1 tablet (50 mg total) by mouth every 6 (six) hours as needed for moderate pain. For PAIN   PRN  . Sodium Chloride, Hypertonic, (SODIUM CHLORIDE OP) Place 1 drop into both eyes 2 (two) times daily.   No facility-administered encounter medications on file as of 12/15/2016.      Patient Active Problem List   Diagnosis Date Noted  . 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 Aortic stenosis 05/21/2015  . 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 Atherosclerosis of native artery of right lower extremity with intermittent claudication (HCC) 04/18/2015  . 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 Hypertension 04/10/2015  . Hyperlipidemia 04/10/2015  . Hypertriglyceridemia 04/10/2015  . CKD (chronic kidney disease) stage 3,  GFR 30-59 ml/min 04/10/2015  . Glaucoma 04/10/2015  . Atherosclerosis of native arteries of extremity with intermittent claudication (HCC) 11/01/2014     Health Maintenance Due  Topic Date Due  . Hepatitis C Screening  0March 27, 1952  . TETANUS/TDAP  11/06/1970  . COLONOSCOPY  11/05/2001  . PNA vac Low Risk Adult (1 of 2 - PCV13) 11/05/2016  . INFLUENZA VACCINE  11/10/2016     Review of Systems   Constitutional: Negative for fever, chills, diaphoresis, activity change, appetite change, fatigue and unexpected weight change.  HENT: Negative for congestion, sore throat, rhinorrhea, sneezing, trouble swallowing and sinus pressure.  Eyes: Negative for photophobia and visual disturbance.  Respiratory: Negative for cough, chest tightness, shortness of breath, wheezing and stridor.  Cardiovascular: Negative for chest pain, palpitations and leg swelling.  Gastrointestinal: Negative for nausea, vomiting, abdominal pain, diarrhea, constipation, blood in stool, abdominal distention and anal bleeding.  Genitourinary: Negative for dysuria, hematuria, flank pain and difficulty urinating.  Musculoskeletal: Negative for myalgias, back pain, joint swelling, arthralgias and gait problem.  Skin: Negative for color change, pallor, rash and wound.  Neurological: Negative for dizziness, tremors, weakness and light-headedness.  Hematological: Negative for adenopathy. Does not bruise/bleed easily.  Psychiatric/Behavioral: Negative for behavioral problems, confusion, sleep disturbance, dysphoric mood, decreased concentration and agitation.    Physical Exam   BP (!) 158/79   Pulse 80   Temp 98.4 F (36.9 C) (Oral)   Wt 196 lb (88.9 kg)   BMI 28.94 kg/m   Physical  Exam  Constitutional: He is oriented to person, place, and time. He appears well-developed and well-nourished. No distress.  HENT:  Mouth/Throat: Oropharynx is clear and moist. No oropharyngeal exudate.  Cardiovascular: Normal rate, regular  rhythm and normal heart sounds. Exam reveals no gallop and no friction rub.  No murmur heard.  Pulmonary/Chest: Effort normal and breath sounds normal. No respiratory distress. He has no wheezes.  Abdominal: Soft. Bowel sounds are normal. He exhibits no distension. There is no tenderness.  Skin: Skin is warm and dry. No rash noted. No erythema.     Lab Results  Component Value Date   HEPBSAB Non Reactive 05/23/2015   No results found for: RPR, LABRPR  CBC Lab Results  Component Value Date   WBC 10.7 (H) 09/07/2016   RBC 4.05 (L) 09/07/2016   HGB 12.0 (L) 09/07/2016   HCT 36.8 (L) 09/07/2016   PLT 277 09/07/2016   MCV 90.9 09/07/2016   MCH 29.6 09/07/2016   MCHC 32.6 09/07/2016   RDW 13.0 09/07/2016    BMET Lab Results  Component Value Date   NA 138 09/07/2016   K 3.8 09/07/2016   CL 101 09/07/2016   CO2 28 09/07/2016   GLUCOSE 98 09/07/2016   BUN 27 (H) 09/07/2016   CREATININE 1.59 (H) 09/07/2016   CALCIUM 9.4 09/07/2016   GFRNONAA 44 (L) 09/07/2016   GFRAA 51 (L) 09/07/2016      Assessment and Plan 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 Currently on chronic suppression with amoxicillin.   We will see him back in 2 months

## 2016-12-22 NOTE — Progress Notes (Signed)
Established Open [facility]   History of Present Illness   Robert DeutscherRobert [name] is a 66 y.o. (Mar 24, 1951) male who presents for routine follow up s/p ABF complicated with possible infected R seroma.   Procedures: 1. Rt groin washout and abx beads removal, VAC placement (09/07/16) 2. I&D R groin abscess , placement abx bead, VAC placement(08/28/16) 3. ABF, B iliofem EA, L iliofem BPA, UHR (05/21/15)  The patient has been seen by ID on 12/15/16.  He continues on Amoxicillin for another 2 months for chronic abx suppression for possible graft infection.  The patient's PMH, PSH, and SH, and FamHx are unchanged from 10/08/16.   Current Outpatient Prescriptions  Medication Sig Dispense Refill  . amoxicillin (AMOXIL) 500 MG capsule Take 1 capsule (500 mg total) by mouth 3 (three) times daily. 90 capsule 4  . aspirin 81 MG tablet Take 81 mg by mouth daily.    . ciclopirox (LOPROX) 0.77 % cream Apply 1 application topically 2 (two) times daily as needed. Gel    . diphenhydrAMINE (BENADRYL) 25 mg capsule Take 25 mg by mouth at bedtime.    Marland Kitchen. ezetimibe-simvastatin (VYTORIN) 10-40 MG per tablet Take 1 tablet by mouth daily.    . fenofibrate (TRICOR) 145 MG tablet Take 145 mg by mouth daily.    . hydrochlorothiazide (HYDRODIURIL) 25 MG tablet Take 25 mg by mouth every morning.  11  . Sodium Chloride, Hypertonic, (SODIUM CHLORIDE OP) Place 1 drop into both eyes 2 (two) times daily.    . traMADol (ULTRAM) 50 MG tablet Take 1 tablet (50 mg total) by mouth every 6 (six) hours as needed for moderate pain. For PAIN   PRN 20 tablet 0   No current facility-administered medications for this visit.     Allergies  Allergen Reactions  . Valsartan-Hydrochlorothiazide Other (See Comments)    Renal failure    On ROS today: no fever or chills, still taking abx.   Physical Examination   Vitals:   12/24/16 1321 12/24/16 1353  BP: (!) 160/69 (!) 145/67  Pulse: 89   Resp: 20   Temp: 97.6 F (36.4 C)     TempSrc: Oral   SpO2: 95%   Weight: 199 lb (90.3 kg)   Height: 5\' 9"  (1.753 m)    Body mass index is 29.39 kg/m.  General Alert, O x 3, WD, NAD  Pulmonary Sym exp, good B air movt, CTA B  Cardiac RRR, Nl S1, S2, no Murmurs, No rubs, No S3,S4  Vascular Vessel Right Left  Radial Palpable Palpable  Brachial Palpable Palpable  Carotid Palpable, No Bruit Palpable, No Bruit  Aorta Not palpable N/A  Femoral Palpable Palpable  Popliteal Not palpable Not palpable  PT Faintly palpable Faintly palpable  DP Not palpable Not palpable    Gastro- intestinal soft, non-distended, non-tender to palpation, No guarding or rebound, no HSM, no masses, no CVAT B, No palpable prominent aortic pulse,    Musculo- skeletal M/S 5/5 throughout  , Extremities without ischemic changes  , both groins healed  Neurologic Pain and light touch intact in extremities  , Motor exam as listed above    Non-Invasive Vascular Imaging   R groin duplex (12/24/2016)  No fluid collection  Patent right aortobifemoral limb   Medical Decision Making   Robert DeutscherRobert [name] is a 66 y.o. male who presents s/p ABF, B iliofem EA, L iliofem BPA, UHR complicated by right groin infected seroma treated with I&D and abx bead placement.  Fortunately, pt was adequately treated with abx as the organism was found to be a pan-sensitive group B strep.  Cont abx as directed by ID  BLE ABI and aortoiliac duplex in one year.  Thank you for allowing [place] to participate in this patient's care.   Leonides Sake, MD, FACS [facility] [place] Office: [phone] Pager: [phone]

## 2016-12-24 ENCOUNTER — Encounter: Payer: Self-pay | Admitting: Vascular Surgery

## 2016-12-24 ENCOUNTER — Ambulatory Visit (INDEPENDENT_AMBULATORY_CARE_PROVIDER_SITE_OTHER): Payer: Medicare Other | Admitting: Vascular Surgery

## 2016-12-24 ENCOUNTER — Ambulatory Visit (HOSPITAL_COMMUNITY)
Admission: RE | Admit: 2016-12-24 | Discharge: 2016-12-24 | Disposition: A | Discharge status: Home / Self Care | Payer: Medicare Other | Source: Ambulatory Visit | Attending: Vascular Surgery | Admitting: Vascular Surgery

## 2016-12-24 VITALS — BP 145/67 | HR 89 | Temp 97.6°F | Resp 20 | Ht 69.0 in | Wt 199.0 lb

## 2016-12-24 DIAGNOSIS — I745 Embolism and thrombosis of iliac artery: Secondary | ICD-10-CM | POA: Diagnosis not present

## 2016-12-24 DIAGNOSIS — T827XXD Infection and inflammatory reaction due to other cardiac and vascular devices, implants and grafts, subsequent encounter: Secondary | ICD-10-CM | POA: Insufficient documentation

## 2016-12-24 DIAGNOSIS — Y832 Surgical operation with anastomosis, bypass or graft as the cause of abnormal reaction of the patient, or of later complication, without mention of misadventure at the time of the procedure: Secondary | ICD-10-CM | POA: Insufficient documentation

## 2016-12-24 DIAGNOSIS — I70212 Atherosclerosis of native arteries of extremities with intermittent claudication, left leg: Secondary | ICD-10-CM

## 2016-12-31 ENCOUNTER — Encounter: Payer: Self-pay | Admitting: Vascular Surgery

## 2017-01-06 NOTE — Addendum Note (Signed)
Addended by: Burton Apley A on: 01/06/2017 04:52 PM   Modules accepted: Orders

## 2017-02-14 ENCOUNTER — Ambulatory Visit (INDEPENDENT_AMBULATORY_CARE_PROVIDER_SITE_OTHER): Payer: Medicare Other | Admitting: Internal Medicine

## 2017-02-14 ENCOUNTER — Other Ambulatory Visit: Payer: Self-pay | Admitting: Internal Medicine

## 2017-02-14 ENCOUNTER — Encounter: Payer: Self-pay | Admitting: Internal Medicine

## 2017-02-14 VITALS — BP 159/80 | HR 90 | Temp 98.6°F | Ht 66.0 in | Wt 200.0 lb

## 2017-02-14 DIAGNOSIS — I745 Embolism and thrombosis of iliac artery: Secondary | ICD-10-CM | POA: Diagnosis not present

## 2017-02-14 DIAGNOSIS — T827XXD Infection and inflammatory reaction due to other cardiac and vascular devices, implants and grafts, subsequent encounter: Secondary | ICD-10-CM

## 2017-02-14 DIAGNOSIS — I10 Essential (primary) hypertension: Secondary | ICD-10-CM

## 2017-02-14 NOTE — Telephone Encounter (Signed)
Entered in error

## 2017-02-14 NOTE — Progress Notes (Signed)
RFV: vascular graft deep tissue infection  Patient ID: Robert [name], male   DOB: 04-Mar-1951, 66 y.o.   MRN: [id number]  HPI  Robert [name] is a 66yo M who has PVD s/p ABF, B iliofem EA, L iliofem BPA, UHR complicated by right groin infected seroma treated with I&D and abx bead placement roughly 5-6 months ago. He was on IV ceftriaxone then converted to oral amoxicillin for the last 4 1/2 months- finishing out his last month of amoxicillin  No health problems  Mild uri last week but now recovered   Outpatient Encounter Medications as of 02/14/2017  Medication Sig  . amoxicillin (AMOXIL) 500 MG capsule Take 1 capsule (500 mg total) by mouth 3 (three) times daily.  Marland Kitchen. aspirin 81 MG tablet Take 81 mg by mouth daily.  . diphenhydrAMINE (BENADRYL) 25 mg capsule Take 25 mg by mouth at bedtime.  Marland Kitchen. ezetimibe-simvastatin (VYTORIN) 10-40 MG per tablet Take 1 tablet by mouth daily.  . fenofibrate (TRICOR) 145 MG tablet Take 145 mg by mouth daily.  . hydrochlorothiazide (HYDRODIURIL) 25 MG tablet Take 25 mg by mouth every morning.  Marland Kitchen. PRESCRIPTION MEDICATION Place 1 drop into both eyes daily. Vzulta  . traMADol (ULTRAM) 50 MG tablet Take 1 tablet (50 mg total) by mouth every 6 (six) hours as needed for moderate pain. For PAIN   PRN  . ciclopirox (LOPROX) 0.77 % cream Apply 1 application topically 2 (two) times daily as needed. Gel  . Sodium Chloride, Hypertonic, (SODIUM CHLORIDE OP) Place 1 drop into both eyes 2 (two) times daily.   No facility-administered encounter medications on file as of 02/14/2017.      Patient Active Problem List   Diagnosis Date Noted  . 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 Aortic stenosis 05/21/2015  . 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 Atherosclerosis of native artery of right lower extremity with intermittent claudication (HCC) 04/18/2015  . Iliac artery occlusion, left (HCC) 04/18/2015    . Hypertension 04/10/2015  . Hyperlipidemia 04/10/2015  . Hypertriglyceridemia 04/10/2015  . CKD (chronic kidney disease) stage 3, GFR 30-59 ml/min (HCC) 04/10/2015  . Glaucoma 04/10/2015  . Atherosclerosis of native arteries of extremity with intermittent claudication (HCC) 11/01/2014     Health Maintenance Due  Topic Date Due  . Hepatitis C Screening  023-Nov-1952  . TETANUS/TDAP  11/06/1970  . COLONOSCOPY  11/05/2001  . PNA vac Low Risk Adult (1 of 2 - PCV13) 11/05/2016  . INFLUENZA VACCINE  11/10/2016     Review of Systems  Physical Exam   BP (!) 159/80   Pulse 90   Temp 98.6 F (37 C) (Oral)   Ht 5\' 6"  (1.676 m)   Wt 200 lb (90.7 kg)   BMI 32.28 kg/m    Physical Exam  Constitutional: He is oriented to person, place, and time. He appears well-developed and well-nourished. No distress.  HENT:  Mouth/Throat: Oropharynx is clear and moist. No oropharyngeal exudate.  Neurological: He is alert and oriented to person, place, and time.  Skin: Skin is warm and dry. No rash noted. No erythema.  Psychiatric: He has a normal mood and affect. His behavior is normal.    Lab Results  Component Value Date   HEPBSAB Non Reactive 05/23/2015   No results found for: RPR, LABRPR  CBC Lab Results  Component Value Date   WBC 10.7 (H) 09/07/2016   RBC 4.05 (L) 09/07/2016   HGB 12.0 (  L) 09/07/2016   HCT 36.8 (L) 09/07/2016   PLT 277 09/07/2016   MCV 90.9 09/07/2016   MCH 29.6 09/07/2016   MCHC 32.6 09/07/2016   RDW 13.0 09/07/2016    BMET Lab Results  Component Value Date   NA 138 09/07/2016   K 3.8 09/07/2016   CL 101 09/07/2016   CO2 28 09/07/2016   GLUCOSE 98 09/07/2016   BUN 27 (H) 09/07/2016   CREATININE 1.59 (H) 09/07/2016   CALCIUM 9.4 09/07/2016   GFRNONAA 44 (L) 09/07/2016   GFRAA 51 (L) 09/07/2016   Lab Results  Component Value Date   ESRSEDRATE 9 02/14/2017   Lab Results  Component Value Date   CRP 1.1 02/14/2017      Assessment and  Plan  Vascular graft deep tissue infection = - finish amoxicillin. Will check sed rate and crp as reflection of completion of abtx course - return as needed  htn = not at goal, he sees his pcp next week to address  Health maintenance = he declined flu vaccine, but mentioned the importance of it given severity of flu season last year

## 2017-02-14 NOTE — Patient Instructions (Signed)
Finish up the rest of your amoxicillin. We will see you back on an as needed basis. It was a pleasure to be part of your care.

## 2017-02-15 LAB — C-REACTIVE PROTEIN: CRP: 1.1 mg/L (ref <8.0)

## 2017-02-15 LAB — SEDIMENTATION RATE: SED RATE: 9 mm/h (ref 0–20)

## 2017-02-25 DIAGNOSIS — E7800 Pure hypercholesterolemia, unspecified: Secondary | ICD-10-CM | POA: Diagnosis not present

## 2017-02-25 DIAGNOSIS — I10 Essential (primary) hypertension: Secondary | ICD-10-CM | POA: Diagnosis not present

## 2017-02-25 DIAGNOSIS — N183 Chronic kidney disease, stage 3 (moderate): Secondary | ICD-10-CM | POA: Diagnosis not present

## 2017-02-25 DIAGNOSIS — I739 Peripheral vascular disease, unspecified: Secondary | ICD-10-CM | POA: Diagnosis not present

## 2017-02-25 DIAGNOSIS — R7303 Prediabetes: Secondary | ICD-10-CM | POA: Diagnosis not present

## 2017-02-25 DIAGNOSIS — M545 Low back pain: Secondary | ICD-10-CM | POA: Diagnosis not present

## 2017-02-25 DIAGNOSIS — Z125 Encounter for screening for malignant neoplasm of prostate: Secondary | ICD-10-CM | POA: Diagnosis not present

## 2017-03-17 DIAGNOSIS — H401131 Primary open-angle glaucoma, bilateral, mild stage: Secondary | ICD-10-CM | POA: Diagnosis not present

## 2017-05-24 ENCOUNTER — Encounter: Payer: Self-pay | Admitting: Cardiology

## 2017-05-30 DIAGNOSIS — R946 Abnormal results of thyroid function studies: Secondary | ICD-10-CM | POA: Diagnosis not present

## 2017-06-15 DIAGNOSIS — H401131 Primary open-angle glaucoma, bilateral, mild stage: Secondary | ICD-10-CM | POA: Diagnosis not present

## 2017-09-14 DIAGNOSIS — H401111 Primary open-angle glaucoma, right eye, mild stage: Secondary | ICD-10-CM | POA: Diagnosis not present

## 2017-09-30 ENCOUNTER — Encounter: Payer: Self-pay | Admitting: Vascular Surgery

## 2017-10-19 ENCOUNTER — Encounter: Payer: Self-pay | Admitting: Vascular Surgery

## 2017-10-25 DIAGNOSIS — I10 Essential (primary) hypertension: Secondary | ICD-10-CM | POA: Diagnosis not present

## 2017-10-25 DIAGNOSIS — M545 Low back pain: Secondary | ICD-10-CM | POA: Diagnosis not present

## 2017-10-25 DIAGNOSIS — L723 Sebaceous cyst: Secondary | ICD-10-CM | POA: Diagnosis not present

## 2017-10-25 DIAGNOSIS — R7303 Prediabetes: Secondary | ICD-10-CM | POA: Diagnosis not present

## 2017-10-25 DIAGNOSIS — E7800 Pure hypercholesterolemia, unspecified: Secondary | ICD-10-CM | POA: Diagnosis not present

## 2017-10-25 DIAGNOSIS — I739 Peripheral vascular disease, unspecified: Secondary | ICD-10-CM | POA: Diagnosis not present

## 2017-10-25 DIAGNOSIS — N183 Chronic kidney disease, stage 3 (moderate): Secondary | ICD-10-CM | POA: Diagnosis not present

## 2017-11-15 DIAGNOSIS — H401131 Primary open-angle glaucoma, bilateral, mild stage: Secondary | ICD-10-CM | POA: Diagnosis not present

## 2017-12-26 ENCOUNTER — Ambulatory Visit (INDEPENDENT_AMBULATORY_CARE_PROVIDER_SITE_OTHER)
Admission: RE | Admit: 2017-12-26 | Discharge: 2017-12-26 | Disposition: A | Discharge status: Home / Self Care | Payer: Medicare Other | Source: Ambulatory Visit | Attending: Surgery | Admitting: Surgery

## 2017-12-26 ENCOUNTER — Encounter: Payer: Self-pay | Admitting: Family

## 2017-12-26 ENCOUNTER — Ambulatory Visit (INDEPENDENT_AMBULATORY_CARE_PROVIDER_SITE_OTHER): Payer: Medicare Other | Admitting: Family

## 2017-12-26 ENCOUNTER — Other Ambulatory Visit: Payer: Self-pay

## 2017-12-26 ENCOUNTER — Ambulatory Visit (HOSPITAL_COMMUNITY)
Admission: RE | Admit: 2017-12-26 | Discharge: 2017-12-26 | Disposition: A | Discharge status: Home / Self Care | Payer: Medicare Other | Source: Ambulatory Visit | Attending: Family | Admitting: Family

## 2017-12-26 VITALS — BP 159/79 | HR 70 | Temp 97.9°F | Resp 18 | Ht 66.0 in | Wt 198.0 lb

## 2017-12-26 DIAGNOSIS — Z95828 Presence of other vascular implants and grafts: Secondary | ICD-10-CM

## 2017-12-26 DIAGNOSIS — I70212 Atherosclerosis of native arteries of extremities with intermittent claudication, left leg: Secondary | ICD-10-CM

## 2017-12-26 DIAGNOSIS — I745 Embolism and thrombosis of iliac artery: Secondary | ICD-10-CM

## 2017-12-26 DIAGNOSIS — I779 Disorder of arteries and arterioles, unspecified: Secondary | ICD-10-CM

## 2017-12-26 DIAGNOSIS — E785 Hyperlipidemia, unspecified: Secondary | ICD-10-CM | POA: Insufficient documentation

## 2017-12-26 DIAGNOSIS — T827XXD Infection and inflammatory reaction due to other cardiac and vascular devices, implants and grafts, subsequent encounter: Secondary | ICD-10-CM

## 2017-12-26 DIAGNOSIS — I10 Essential (primary) hypertension: Secondary | ICD-10-CM | POA: Diagnosis not present

## 2017-12-26 DIAGNOSIS — Z87891 Personal history of nicotine dependence: Secondary | ICD-10-CM

## 2017-12-26 DIAGNOSIS — E669 Obesity, unspecified: Secondary | ICD-10-CM | POA: Diagnosis not present

## 2017-12-26 NOTE — Patient Instructions (Addendum)
Before your next abdominal ultrasound:  Avoid gas forming foods and beverages the day before the test.   Take two Extra-Strength Gas-X capsules at bedtime the night before the test. Take another two Extra-Strength Gas-X capsules in the middle of the night if you get up to the restroom, if not, first thing in the morning with water.  Do not chew gum.      Peripheral Vascular Disease Peripheral vascular disease (PVD) is a disease of the blood vessels that are not part of your heart and brain. A simple term for PVD is poor circulation. In most cases, PVD narrows the blood vessels that carry blood from your heart to the rest of your body. This can result in a decreased supply of blood to your arms, legs, and internal organs, like your stomach or kidneys. However, it most often affects a person's lower legs and feet. There are two types of PVD.  Organic PVD. This is the more common type. It is caused by damage to the structure of blood vessels.  Functional PVD. This is caused by conditions that make blood vessels contract and tighten (spasm).  Without treatment, PVD tends to get worse over time. PVD can also lead to acute ischemic limb. This is when an arm or limb suddenly has trouble getting enough blood. This is a medical emergency. Follow these instructions at home:  Take medicines only as told by your doctor.  Do not use any tobacco products, including cigarettes, chewing tobacco, or electronic cigarettes. If you need help quitting, ask your doctor.  Lose weight if you are overweight, and maintain a healthy weight as told by your doctor.  Eat a diet that is low in fat and cholesterol. If you need help, ask your doctor.  Exercise regularly. Ask your doctor for some good activities for you.  Take good care of your feet. ? Wear comfortable shoes that fit well. ? Check your feet often for any cuts or sores. Contact a doctor if:  You have cramps in your legs while walking.  You have  leg pain when you are at rest.  You have coldness in a leg or foot.  Your skin changes.  You are unable to get or have an erection (erectile dysfunction).  You have cuts or sores on your feet that are not healing. Get help right away if:  Your arm or leg turns cold and blue.  Your arms or legs become red, warm, swollen, painful, or numb.  You have chest pain or trouble breathing.  You suddenly have weakness in your face, arm, or leg.  You become very confused or you cannot speak.  You suddenly have a very bad headache.  You suddenly cannot see. This information is not intended to replace advice given to you by your health care provider. Make sure you discuss any questions you have with your health care provider. Document Released: 06/23/2009 Document Revised: 09/04/2015 Document Reviewed: 09/06/2013 Elsevier Interactive Patient Education  2017 Elsevier Inc.  

## 2017-12-26 NOTE — Progress Notes (Signed)
[facility] [place]   CC: Follow up peripheral artery occlusive disease  History of Present Illness Robert [name] is a 67 y.o. male who presents for routine follow up s/p ABF complicated with possible infected R seroma.   Procedures: 1. Rt groin washout and abx beads removal, VAC placement (09/07/16) 2. I&D R groin abscess , placement abx bead, VAC placement(08/28/16) 3. ABF, B iliofem EA, L iliofem BPA, UHR (05/21/15)  The patient was been seen by ID on 12/15/16.  He continued on Amoxicillin for another 2 months for chronic abx suppression for possible graft infection.   ABF bypass graft complicated by ARF sec. ARB/hypotension, UHR, L iliofem BPA (Date: 05/21/15) by Dr. Imogene Burn.   Before the bypass graft, his calves hurt and he could not walk much. He now walks 2 miles daily, and after a mile both legs (entire leg) aches, relieved by brief rest.   Dr. Imogene Burn last evaluated pt on 12-25-15. At that time pt is s/p ABF, B iliofem EA, L iliofem BPA, UHR complicated by right groin infected seroma treated with I&D and abx bead placement.   Fortunately, pt was adequately treated with abx as the organism was found to be a pan-sensitive group B strep.  Cont abx as directed by ID BLE ABI and aortoiliac duplex in one year.  Pt denies any claudication symptoms in his legs with walking, denies any history of stroke or TIA.   On 09-20-16 his serum creatinine was elevated at 1.41, GFR was 52 (review of records from his PCP). 140's-150's is his systolic blood pressure at home per pt.   Diabetic: pre diabetes Tobacco use: former smoker  Pt meds include: Statin :Yes Betablocker: No ASA: Yes Other anticoagulants/antiplatelets: no    Past Medical History:  Diagnosis Date  . Cataracts, bilateral    immature  . Chronic back pain    ruptured disc  . Chronic kidney disease    Stage III  Moderate  . Glaucoma    uses eye drops nightly  . History of bronchitis as a child    . Hyperlipidemia    takes Tricor and Vytorin daily  . Hypertension    takes Diovan HCT daily  . Hypertension 04/10/2015  . Nocturia   . Peripheral vascular disease [facility])     Social History Social History   Tobacco Use  . Smoking status: Former Games developer  . Smokeless tobacco: Never Used  . Tobacco comment: quit smoking 11 yrs ago  Substance Use Topics  . Alcohol use: Yes    Alcohol/week: 3.0 standard drinks    Types: 3 Cans of beer per week    Comment: couple of beers a day  . Drug use: No    Family History Family History  Problem Relation Age of Onset  . Hyperlipidemia Father   . Peripheral vascular disease Father        amputation  . Cancer Brother        colon/bowel cancer  . Hyperlipidemia Brother   . Cancer Mother   . Liver disease Mother   . Cancer Maternal Grandmother   . Heart disease Paternal Grandmother   . Diabetes Paternal Grandmother     Past Surgical History:  Procedure Laterality Date  . AORTA - BILATERAL FEMORAL ARTERY BYPASS GRAFT N/A 05/21/2015   Procedure: AORTOBIFEMORAL BYPASS GRAFT USING HEMASHIELD GOLD 14X8MM X 40CM VASCULAR GRAFT;  Surgeon: Fransisco Hertz, MD;  Location: [facility] OR;  Service: Vascular;  Laterality: N/A;  . APPLICATION  OF WOUND VAC Right 08/28/2016   Procedure: APPLICATION OF WOUND VAC;  Surgeon: Fransisco Hertzhen, Brian L, MD;  [facility];  Service: Vascular;  Laterality: Right;  . AXILLARY-FEMORAL BYPASS GRAFT Right 08/28/2016   Procedure: RIGHT GROIN EXPLORATION , IRRIGATION AND DEBRIDEMENT OF RIGHT GROIN FLUID COLLECTION,  Placement of Antibiotic Beads VAC PLACEMENT;  Surgeon: Fransisco Hertzhen, Brian L, MD;  [facility];  Service: Vascular;  Laterality: Right;  . ENDARTERECTOMY FEMORAL Bilateral 05/21/2015   Procedure: ENDARTERECTOMY BILATERAL FEMORAL ARTERIES;  Surgeon: Fransisco HertzBrian L Chen, MD;  [facility];  Service: Vascular;  Laterality: Bilateral;  . I&D EXTREMITY Right 09/07/2016   Procedure: RIGHT GROIN WASHOUT WITH REMOVAL BEADS AND WOUND VAC  PLACEMENT;  Surgeon: Fransisco Hertzhen, Brian L, MD;  [facility];  Service: Vascular;  Laterality: Right;  . INSERTION OF DIALYSIS CATHETER N/A 05/23/2015   Procedure: INSERTION OF DIALYSIS CATHETER left internal jugular;  Surgeon: Chuck Hinthristopher S Dickson, MD;  [facility];  Service: Vascular;  Laterality: N/A;  . IR FLUORO GUIDE CV LINE RIGHT  08/31/2016  . IR REMOVAL TUN CV CATH W/O FL  10/12/2016  . IR US GUIDE VASC ACCESS RIGHT  08/31/2016  . PATCH ANGIOPLASTY Left 05/21/2015   Procedure: LEFT FEMORAL ARTERY PATCH ANGIOPLASTY USING Livia SnellenXENOSURE BIOLOGIC PATCH;  Surgeon: Fransisco HertzBrian L Chen, MD;  [facility];  Service: Vascular;  Laterality: Left;  . PERIPHERAL VASCULAR CATHETERIZATION N/A 03/13/2015   Procedure: Abdominal Aortogram;  Surgeon: Fransisco HertzBrian L Chen, MD;  [facility];  Service: Cardiovascular;  Laterality: N/A;    Allergies  Allergen Reactions  . Valsartan-Hydrochlorothiazide Other (See Comments)    Renal failure    Current Outpatient Medications  Medication Sig Dispense Refill  . amLODipine (NORVASC) 10 MG tablet Take 10 mg by mouth daily.  5  . aspirin 81 MG tablet Take 81 mg by mouth daily.    . COMBIGAN 0.2-0.5 % ophthalmic solution INSTILL 1 DROP INTO LEFT EYE TWICE A DAY AS DIRECTED  1  . diphenhydrAMINE (BENADRYL) 25 mg capsule Take 25 mg by mouth at bedtime.    Marland Kitchen. ezetimibe-simvastatin (VYTORIN) 10-40 MG per tablet Take 1 tablet by mouth daily.    . fenofibrate (TRICOR) 145 MG tablet Take 145 mg by mouth daily.    . hydrochlorothiazide (HYDRODIURIL) 25 MG tablet Take 25 mg by mouth every morning.  11  . PRESCRIPTION MEDICATION Place 1 drop into both eyes daily. Vzulta    . Sodium Chloride, Hypertonic, (SODIUM CHLORIDE OP) Place 1 drop into both eyes 2 (two) times daily.    . traMADol (ULTRAM) 50 MG tablet Take 1 tablet (50 mg total) by mouth every 6 (six) hours as needed for moderate pain. For PAIN   PRN 20 tablet 0  . VYZULTA 0.024 % SOLN INSTILL 1 DROP INTO BOTH EYES AT  BEDTIME AS DIRECTED  6  . amoxicillin (AMOXIL) 500 MG capsule Take 1 capsule (500 mg total) by mouth 3 (three) times daily. (Patient not taking: Reported on 12/26/2017) 90 capsule 4  . ciclopirox (LOPROX) 0.77 % cream Apply 1 application topically 2 (two) times daily as needed. Gel     No current facility-administered medications for this visit.     ROS: See HPI for pertinent positives and negatives.   Physical Examination  Vitals:   12/26/17 0903 12/26/17 0908  BP: (!) 154/78 (!) 159/79  Pulse: 68 70  Resp: 18   Temp: 97.9 F (36.6 C)   TempSrc: Oral  Weight: 198 lb (89.8 kg)   Height: 5\' 6"  (1.676 m)    Body mass index is 31.96 kg/m.  General: A&O x 3, WDWN obese male. Gait: normal Eyes: PERRLA. Pulmonary: Respirations are non labored, CTAB, good air movement Cardiac: regular rhythm and rate, no detected murmur.         Carotid Bruits Right Left   Negative Negative   Abdominal aortic pulse is not palpable. Radial pulses: palpable bilaterally                           VASCULAR EXAM: Extremities without ischemic changes, without Gangrene; without open wounds.                                                                                                                                                       LE Pulses Right Left       FEMORAL  3+ palpable  3+ palpable        POPLITEAL  1+ palpable   1+ palpable       POSTERIOR TIBIAL  2+ palpable   2+ palpable        DORSALIS PEDIS      ANTERIOR TIBIAL faintly palpable  2+ palpable    Abdomen: soft, NT; right groin is well healed with gathered scar tissue. Musculoskeletal: no muscle wasting or atrophy.         Skin: no rashes, no cellulitis, no ulcers noted. Neurologic: A&O X 3; appropriate affect, Sensation is normal; MOTOR FUNCTION:  moving all extremities equally, motor strength 5/5 throughout. Speech is fluent/normal. CN 2-12 intact. Psychiatric: Thought content is normal, mood appropriate for  clinical situation.     ASSESSMENT: Robert [name] is a 67 y.o. male who is s/p  1. Rt groin washout and abx beads removal, VAC placement (09/07/16) 2. I&D R groin abscess , placement abx bead, VAC placement(08/28/16) 3. ABF, B iliofem EA, L iliofem BPA, UHR (05/21/15) By Dr. Imogene Burn.  Pt denies any claudication symptoms in his legs with walking, there are no signs of ischemia in his feet or legs.   DATA  Bilateral LE Arterial Duplex (12-26-17): Abdominal Aorta Findings: +--------+-------+----------+----------+--------+--------+--------+ LocationAP (cm)Trans (cm)PSV (cm/s)WaveformThrombusComments +--------+-------+----------+----------+--------+--------+--------+ Proximal1.70  1.80   126                 +--------+-------+----------+----------+--------+--------+--------+ Mid            83                 +--------+-------+----------+----------+--------+--------+--------+   Right Graft #1: +------------------+--------+--------+---------+--------+          PSV cm/sStenosisWaveform Comments +------------------+--------+--------+---------+--------+ Inflow                        +------------------+--------+--------+---------+--------+ Prox Anastomosis 80  biphasic      +------------------+--------+--------+---------+--------+ Proximal Graft  77       biphasic      +------------------+--------+--------+---------+--------+ Mid Graft     95       biphasic      +------------------+--------+--------+---------+--------+ Distal Graft   93       triphasic     +------------------+--------+--------+---------+--------+ Distal Anastomosis99       triphasic     +------------------+--------+--------+---------+--------+ Outflow      135       biphasic       +------------------+--------+--------+---------+--------+  Left Graft #1: +--------------------+--------+--------+---------+--------+           PSV cm/sStenosisWaveform Comments +--------------------+--------+--------+---------+--------+ Inflow                         +--------------------+--------+--------+---------+--------+ Proximal Anastomosis97       biphasic      +--------------------+--------+--------+---------+--------+ Proximal Graft   90       biphasic      +--------------------+--------+--------+---------+--------+ Mid Graft      101       biphasic      +--------------------+--------+--------+---------+--------+ Distal Graft    116       biphasic      +--------------------+--------+--------+---------+--------+ Distal Anastasis  182       triphasic     +--------------------+--------+--------+---------+--------+ Outflow       165       biphasic      +--------------------+--------+--------+---------+--------+  Final Interpretation: Abdominal Aorta: Anechoic structure seen in right groin measuring 6.8 x 5.6 cm. Velocities in proximal right SFA increase from 94.3 cm/sec to 388 cm/sec with a velocity ratio of 4.11 suggestive of 50-74% stenosis (high end of range). Stenosis: Patent aorto-bifemoral bypass graft.  Incidental finding 50-74% proximal SFA stenosis (high end of range).   ABI (Date: 12/26/2017):  R:   ABI: 0.91 (was 0.74 on 05-29-15),   PT: bi  DP: tri  TBI:  0.60, toe pressure 90  L:   ABI: 1.13 (was 0.82),   PT: tri  DP: tri  TBI: 0.73, toe pressure 110 Improved bilateral ABI with bi and triphasic waveforms; mild disease in the right, normal in the left.     PLAN:  Based on the patient's vascular studies and examination, pt will return to clinic in 1 year with bilateral aortoiliac  duplex and ABI's.   I discussed in depth with the patient the nature of atherosclerosis, and emphasized the importance of maximal medical management including strict control of blood pressure, blood glucose, and lipid levels, obtaining regular exercise, and continued cessation of smoking.  The patient is aware that without maximal medical management the underlying atherosclerotic disease process will progress, limiting the benefit of any interventions.  The patient was given information about PAD including signs, symptoms, treatment, what symptoms should prompt the patient to seek immediate medical care, and risk reduction measures to take.  Charisse March, RN, MSN, FNP-C [facility] [place] Phone: [phone]  Clinic MD: Myra Gianotti  12/26/17 9:28 AM

## 2018-02-15 DIAGNOSIS — H401131 Primary open-angle glaucoma, bilateral, mild stage: Secondary | ICD-10-CM | POA: Diagnosis not present

## 2018-03-20 DIAGNOSIS — H401131 Primary open-angle glaucoma, bilateral, mild stage: Secondary | ICD-10-CM | POA: Diagnosis not present

## 2018-05-02 DIAGNOSIS — M545 Low back pain: Secondary | ICD-10-CM | POA: Diagnosis not present

## 2018-05-02 DIAGNOSIS — E7800 Pure hypercholesterolemia, unspecified: Secondary | ICD-10-CM | POA: Diagnosis not present

## 2018-05-02 DIAGNOSIS — Z125 Encounter for screening for malignant neoplasm of prostate: Secondary | ICD-10-CM | POA: Diagnosis not present

## 2018-05-02 DIAGNOSIS — N183 Chronic kidney disease, stage 3 (moderate): Secondary | ICD-10-CM | POA: Diagnosis not present

## 2018-05-02 DIAGNOSIS — R7303 Prediabetes: Secondary | ICD-10-CM | POA: Diagnosis not present

## 2018-05-02 DIAGNOSIS — I10 Essential (primary) hypertension: Secondary | ICD-10-CM | POA: Diagnosis not present

## 2018-06-22 DIAGNOSIS — Z961 Presence of intraocular lens: Secondary | ICD-10-CM | POA: Diagnosis not present

## 2018-06-22 DIAGNOSIS — H1859 Other hereditary corneal dystrophies: Secondary | ICD-10-CM | POA: Diagnosis not present

## 2018-06-22 DIAGNOSIS — H1045 Other chronic allergic conjunctivitis: Secondary | ICD-10-CM | POA: Diagnosis not present

## 2018-06-22 DIAGNOSIS — H401131 Primary open-angle glaucoma, bilateral, mild stage: Secondary | ICD-10-CM | POA: Diagnosis not present

## 2018-09-15 DIAGNOSIS — H43811 Vitreous degeneration, right eye: Secondary | ICD-10-CM | POA: Diagnosis not present

## 2018-09-21 DIAGNOSIS — H401131 Primary open-angle glaucoma, bilateral, mild stage: Secondary | ICD-10-CM | POA: Diagnosis not present

## 2018-10-31 DIAGNOSIS — R7303 Prediabetes: Secondary | ICD-10-CM | POA: Diagnosis not present

## 2018-10-31 DIAGNOSIS — I10 Essential (primary) hypertension: Secondary | ICD-10-CM | POA: Diagnosis not present

## 2018-10-31 DIAGNOSIS — N183 Chronic kidney disease, stage 3 (moderate): Secondary | ICD-10-CM | POA: Diagnosis not present

## 2018-10-31 DIAGNOSIS — M545 Low back pain: Secondary | ICD-10-CM | POA: Diagnosis not present

## 2018-10-31 DIAGNOSIS — R946 Abnormal results of thyroid function studies: Secondary | ICD-10-CM | POA: Diagnosis not present

## 2018-10-31 DIAGNOSIS — I739 Peripheral vascular disease, unspecified: Secondary | ICD-10-CM | POA: Diagnosis not present

## 2018-10-31 DIAGNOSIS — E7800 Pure hypercholesterolemia, unspecified: Secondary | ICD-10-CM | POA: Diagnosis not present

## 2019-01-01 DIAGNOSIS — H401131 Primary open-angle glaucoma, bilateral, mild stage: Secondary | ICD-10-CM | POA: Diagnosis not present

## 2019-05-09 DIAGNOSIS — K432 Incisional hernia without obstruction or gangrene: Secondary | ICD-10-CM | POA: Diagnosis not present

## 2019-05-09 DIAGNOSIS — I10 Essential (primary) hypertension: Secondary | ICD-10-CM | POA: Diagnosis not present

## 2019-05-09 DIAGNOSIS — G5712 Meralgia paresthetica, left lower limb: Secondary | ICD-10-CM | POA: Diagnosis not present

## 2019-05-09 DIAGNOSIS — Z0000 Encounter for general adult medical examination without abnormal findings: Secondary | ICD-10-CM | POA: Diagnosis not present

## 2019-05-09 DIAGNOSIS — E7800 Pure hypercholesterolemia, unspecified: Secondary | ICD-10-CM | POA: Diagnosis not present

## 2019-05-09 DIAGNOSIS — M545 Low back pain: Secondary | ICD-10-CM | POA: Diagnosis not present

## 2019-05-09 DIAGNOSIS — Z1159 Encounter for screening for other viral diseases: Secondary | ICD-10-CM | POA: Diagnosis not present

## 2019-05-09 DIAGNOSIS — I739 Peripheral vascular disease, unspecified: Secondary | ICD-10-CM | POA: Diagnosis not present

## 2019-05-09 DIAGNOSIS — Z1211 Encounter for screening for malignant neoplasm of colon: Secondary | ICD-10-CM | POA: Diagnosis not present

## 2019-05-09 DIAGNOSIS — R7303 Prediabetes: Secondary | ICD-10-CM | POA: Diagnosis not present

## 2019-05-09 DIAGNOSIS — Z125 Encounter for screening for malignant neoplasm of prostate: Secondary | ICD-10-CM | POA: Diagnosis not present

## 2019-05-09 DIAGNOSIS — N183 Chronic kidney disease, stage 3 unspecified: Secondary | ICD-10-CM | POA: Diagnosis not present

## 2019-05-10 DIAGNOSIS — Z1211 Encounter for screening for malignant neoplasm of colon: Secondary | ICD-10-CM | POA: Diagnosis not present

## 2019-05-15 ENCOUNTER — Ambulatory Visit: Payer: 59

## 2019-05-20 ENCOUNTER — Ambulatory Visit: Payer: 59

## 2019-07-02 DIAGNOSIS — H401131 Primary open-angle glaucoma, bilateral, mild stage: Secondary | ICD-10-CM | POA: Diagnosis not present

## 2019-07-19 ENCOUNTER — Other Ambulatory Visit: Payer: Self-pay | Admitting: *Deleted

## 2019-07-19 DIAGNOSIS — I779 Disorder of arteries and arterioles, unspecified: Secondary | ICD-10-CM

## 2019-07-19 DIAGNOSIS — Z95828 Presence of other vascular implants and grafts: Secondary | ICD-10-CM

## 2019-07-24 ENCOUNTER — Encounter: Payer: Self-pay | Admitting: Vascular Surgery

## 2019-07-24 ENCOUNTER — Other Ambulatory Visit: Payer: Self-pay

## 2019-07-24 ENCOUNTER — Ambulatory Visit (INDEPENDENT_AMBULATORY_CARE_PROVIDER_SITE_OTHER)
Admission: RE | Admit: 2019-07-24 | Discharge: 2019-07-24 | Disposition: A | Discharge status: Home / Self Care | Payer: Medicare Other | Source: Ambulatory Visit | Attending: Vascular Surgery | Admitting: Vascular Surgery

## 2019-07-24 ENCOUNTER — Ambulatory Visit (HOSPITAL_COMMUNITY)
Admission: RE | Admit: 2019-07-24 | Discharge: 2019-07-24 | Disposition: A | Discharge status: Home / Self Care | Payer: Medicare Other | Source: Ambulatory Visit | Attending: Vascular Surgery | Admitting: Vascular Surgery

## 2019-07-24 ENCOUNTER — Ambulatory Visit (INDEPENDENT_AMBULATORY_CARE_PROVIDER_SITE_OTHER): Payer: Medicare Other | Admitting: Vascular Surgery

## 2019-07-24 VITALS — BP 146/68 | HR 74 | Temp 98.1°F | Resp 18 | Ht 66.0 in | Wt 210.0 lb

## 2019-07-24 DIAGNOSIS — I779 Disorder of arteries and arterioles, unspecified: Secondary | ICD-10-CM | POA: Insufficient documentation

## 2019-07-24 DIAGNOSIS — Z95828 Presence of other vascular implants and grafts: Secondary | ICD-10-CM | POA: Insufficient documentation

## 2019-07-24 DIAGNOSIS — I70212 Atherosclerosis of native arteries of extremities with intermittent claudication, left leg: Secondary | ICD-10-CM | POA: Diagnosis not present

## 2019-07-24 NOTE — Progress Notes (Signed)
Patient name: Robert [name] MRN: [id number] DOB: [birth year] Sex: male  REASON FOR VISIT: 1 year follow-up for surveillance of aortobifemoral bypass for claudication  HPI: Robert [name] is a 69 y.o. male the presents for 1 year follow-up for surveillance of his aortobifemoral bypass for lower extremity claudication.  Patient previously was under the care of Dr. Theadore Nan.  He had an aortobifemoral bypass with 14 x 8 Dacron graft and bilateral iliofemoral endarterectomies 2017.  Subsequently in 08/2016 he developed a right groin fluid collection requiring I&D and VAC placement and ultimately completed antibiotics over 2 months and followed by ID.  He reports no issues over the last year.  No pain in his legs when he walks.  No tissue loss.  No rest pain.  Right groin has remained healed and has had no further issues.  States he works in his garden.  Previous smoker but is no longer smoking.  Past Medical History:  Diagnosis Date  . Cataracts, bilateral    immature  . Chronic back pain    ruptured disc  . Chronic kidney disease    Stage III  Moderate  . Glaucoma    uses eye drops nightly  . History of bronchitis as a child   . Hyperlipidemia    takes Tricor and Vytorin daily  . Hypertension    takes Diovan HCT daily  . Hypertension 04/10/2015  . Nocturia   . Peripheral vascular disease [facility])     Past Surgical History:  Procedure Laterality Date  . AORTA - BILATERAL FEMORAL ARTERY BYPASS GRAFT N/A 05/21/2015   Procedure: AORTOBIFEMORAL BYPASS GRAFT USING HEMASHIELD GOLD 14X8MM X 40CM VASCULAR GRAFT;  Surgeon: Fransisco Hertz, MD;  Location: [facility] OR;  Service: Vascular;  Laterality: N/A;  . APPLICATION OF WOUND VAC Right 08/28/2016   Procedure: APPLICATION OF WOUND VAC;  Surgeon: Fransisco Hertz, MD;  Location[facility] OR;  Service: Vascular;  Laterality: Right;  . AXILLARY-FEMORAL BYPASS GRAFT Right 08/28/2016   Procedure: RIGHT GROIN EXPLORATION , IRRIGATION AND DEBRIDEMENT OF RIGHT GROIN FLUID  COLLECTION,  Placement of Antibiotic Beads VAC PLACEMENT;  Surgeon: Fransisco Hertz, MD;  Location: [facility] OR;  Service: Vascular;  Laterality: Right;  . ENDARTERECTOMY FEMORAL Bilateral 05/21/2015   Procedure: ENDARTERECTOMY BILATERAL FEMORAL ARTERIES;  Surgeon: Fransisco Hertz, MD;  [facility];  Service: Vascular;  Laterality: Bilateral;  . I & D EXTREMITY Right 09/07/2016   Procedure: RIGHT GROIN WASHOUT WITH REMOVAL BEADS AND WOUND VAC PLACEMENT;  Surgeon: Fransisco Hertz, MD;  Location: [facility] OR;  Service: Vascular;  Laterality: Right;  . INSERTION OF DIALYSIS CATHETER N/A 05/23/2015   Procedure: INSERTION OF DIALYSIS CATHETER left internal jugular;  Surgeon: Chuck Hint, MD;  Location: [facility] OR;  Service: Vascular;  Laterality: N/A;  . IR FLUORO GUIDE CV LINE RIGHT  08/31/2016  . IR REMOVAL TUN CV CATH W/O FL  10/12/2016  . IR US GUIDE VASC ACCESS RIGHT  08/31/2016  . PATCH ANGIOPLASTY Left 05/21/2015   Procedure: LEFT FEMORAL ARTERY PATCH ANGIOPLASTY USING Livia Snellen BIOLOGIC PATCH;  Surgeon: Fransisco Hertz, MD;  Location: [facility] OR;  Service: Vascular;  Laterality: Left;  . PERIPHERAL VASCULAR CATHETERIZATION N/A 03/13/2015   Procedure: Abdominal Aortogram;  Surgeon: Fransisco Hertz, MD;  Location: [facility] INVASIVE CV LAB;  Service: Cardiovascular;  Laterality: N/A;    Family History  Problem Relation Age of Onset  . Hyperlipidemia Father   . Peripheral vascular disease Father  amputation  . Cancer Brother        colon/bowel cancer  . Hyperlipidemia Brother   . Cancer Mother   . Liver disease Mother   . Cancer Maternal Grandmother   . Heart disease Paternal Grandmother   . Diabetes Paternal Grandmother     SOCIAL HISTORY: Social History   Tobacco Use  . Smoking status: Former Games developer  . Smokeless tobacco: Never Used  . Tobacco comment: quit smoking 11 yrs ago  Substance Use Topics  . Alcohol use: Yes    Alcohol/week: 3.0 standard drinks    Types: 3 Cans of beer per week    Comment: couple  of beers a day    Allergies  Allergen Reactions  . Valsartan-Hydrochlorothiazide Other (See Comments)    Renal failure    Current Outpatient Medications  Medication Sig Dispense Refill  . amLODipine (NORVASC) 10 MG tablet Take 10 mg by mouth daily.  5  . aspirin 81 MG tablet Take 81 mg by mouth daily.    . COMBIGAN 0.2-0.5 % ophthalmic solution INSTILL 1 DROP INTO LEFT EYE TWICE A DAY AS DIRECTED  1  . diphenhydrAMINE (BENADRYL) 25 mg capsule Take 25 mg by mouth at bedtime.    Marland Kitchen doxazosin (CARDURA) 4 MG tablet Take 4 mg by mouth daily.    Marland Kitchen ezetimibe-simvastatin (VYTORIN) 10-40 MG per tablet Take 1 tablet by mouth daily.    . fenofibrate (TRICOR) 145 MG tablet Take 145 mg by mouth daily.    . hydrochlorothiazide (HYDRODIURIL) 25 MG tablet Take 25 mg by mouth every morning.  11  . PRESCRIPTION MEDICATION Place 1 drop into both eyes daily. Vzulta    . Sodium Chloride, Hypertonic, (SODIUM CHLORIDE OP) Place 1 drop into both eyes 2 (two) times daily.    . traMADol (ULTRAM) 50 MG tablet Take 1 tablet (50 mg total) by mouth every 6 (six) hours as needed for moderate pain. For PAIN   PRN 20 tablet 0  . VYZULTA 0.024 % SOLN INSTILL 1 DROP INTO BOTH EYES AT BEDTIME AS DIRECTED  6  . amoxicillin (AMOXIL) 500 MG capsule Take 1 capsule (500 mg total) by mouth 3 (three) times daily. (Patient not taking: Reported on 12/26/2017) 90 capsule 4  . ciclopirox (LOPROX) 0.77 % cream Apply 1 application topically 2 (two) times daily as needed. Gel     No current facility-administered medications for this visit.    REVIEW OF SYSTEMS:  [X]  denotes positive finding, [ ]  denotes negative finding Cardiac  Comments:  Chest pain or chest pressure:    Shortness of breath upon exertion:    Short of breath when lying flat:    Irregular heart rhythm:        Vascular    Pain in calf, thigh, or hip brought on by ambulation:    Pain in feet at night that wakes you up from your sleep:     Blood clot in your veins:     Leg swelling:         Pulmonary    Oxygen at home:    Productive cough:     Wheezing:         Neurologic    Sudden weakness in arms or legs:     Sudden numbness in arms or legs:     Sudden onset of difficulty speaking or slurred speech:    Temporary loss of vision in one eye:     Problems with dizziness:  Gastrointestinal    Blood in stool:     Vomited blood:         Genitourinary    Burning when urinating:     Blood in urine:        Psychiatric    Major depression:         Hematologic    Bleeding problems:    Problems with blood clotting too easily:        Skin    Rashes or ulcers:        Constitutional    Fever or chills:      PHYSICAL EXAM: Vitals:   07/24/19 0916  BP: (!) 146/68  Pulse: 74  Resp: 18  Temp: 98.1 F (36.7 C)  TempSrc: Temporal  SpO2: 96%  Weight: 210 lb (95.3 kg)  Height: 5\' 6"  (1.676 m)    GENERAL: The patient is a well-nourished male, in no acute distress. The vital signs are documented above. CARDIAC: There is a regular rate and rhythm.  VASCULAR:  Palpable femoral pulses bilaterally Right groin incision from previous I&D well-healed with no drainage Right PT palpable Left PT DP palpable PULMONARY: There is good air exchange bilaterally without wheezing or rales. ABDOMEN: Soft and non-tender with normal pitched bowel sounds.  MUSCULOSKELETAL: There are no major deformities or cyanosis. NEUROLOGIC: No focal weakness or paresthesias are detected.   DATA:   I independently reviewed his aortoiliac duplex and his aortobifemoral bypass is widely patent.  There is an incidental finding of a high-grade right SFA stenosis.  ABI 0.88 on the right and 1.05 on the left.  Assessment/Plan:  69 year old male presents for surveillance of his aortobifemoral bypass performed in 2017 by Dr. Bridgett Larsson.  On duplex today his aortobifemoral bypass is widely patent.  He has palpable femoral pulses bilaterally and also has palpable pedal  pulses.  There was an incidental finding of a high-grade right SFA stenosis but in the absence of claudication or other lower extremity symptoms I discussed with him that this does not require any intervention from our standpoint.  We will arrange follow-up again in 1 year with aortoiliac duplex and ABIs for ongoing surveillance.  Discussed that he call with questions or concerns.   Marty Heck, MD [facility] [place] Office: [phone]

## 2019-07-25 ENCOUNTER — Other Ambulatory Visit: Payer: Self-pay | Admitting: *Deleted

## 2019-07-25 DIAGNOSIS — I779 Disorder of arteries and arterioles, unspecified: Secondary | ICD-10-CM

## 2019-07-25 DIAGNOSIS — Z95828 Presence of other vascular implants and grafts: Secondary | ICD-10-CM

## 2019-08-06 DIAGNOSIS — H18593 Other hereditary corneal dystrophies, bilateral: Secondary | ICD-10-CM | POA: Diagnosis not present

## 2019-08-20 DIAGNOSIS — H52213 Irregular astigmatism, bilateral: Secondary | ICD-10-CM | POA: Diagnosis not present

## 2019-08-20 DIAGNOSIS — H18529 Epithelial (juvenile) corneal dystrophy, unspecified eye: Secondary | ICD-10-CM | POA: Diagnosis not present

## 2019-08-20 DIAGNOSIS — Z961 Presence of intraocular lens: Secondary | ICD-10-CM | POA: Diagnosis not present

## 2019-08-20 DIAGNOSIS — H18599 Other hereditary corneal dystrophies, unspecified eye: Secondary | ICD-10-CM | POA: Diagnosis not present

## 2019-08-20 DIAGNOSIS — H401134 Primary open-angle glaucoma, bilateral, indeterminate stage: Secondary | ICD-10-CM | POA: Diagnosis not present

## 2019-11-06 DIAGNOSIS — F419 Anxiety disorder, unspecified: Secondary | ICD-10-CM | POA: Diagnosis not present

## 2019-11-06 DIAGNOSIS — R946 Abnormal results of thyroid function studies: Secondary | ICD-10-CM | POA: Diagnosis not present

## 2019-11-06 DIAGNOSIS — R7303 Prediabetes: Secondary | ICD-10-CM | POA: Diagnosis not present

## 2019-11-06 DIAGNOSIS — N183 Chronic kidney disease, stage 3 unspecified: Secondary | ICD-10-CM | POA: Diagnosis not present

## 2019-11-06 DIAGNOSIS — R7309 Other abnormal glucose: Secondary | ICD-10-CM | POA: Diagnosis not present

## 2019-11-06 DIAGNOSIS — E7800 Pure hypercholesterolemia, unspecified: Secondary | ICD-10-CM | POA: Diagnosis not present

## 2019-11-06 DIAGNOSIS — M545 Low back pain: Secondary | ICD-10-CM | POA: Diagnosis not present

## 2019-11-06 DIAGNOSIS — I739 Peripheral vascular disease, unspecified: Secondary | ICD-10-CM | POA: Diagnosis not present

## 2019-11-06 DIAGNOSIS — I10 Essential (primary) hypertension: Secondary | ICD-10-CM | POA: Diagnosis not present

## 2020-01-02 DIAGNOSIS — H401131 Primary open-angle glaucoma, bilateral, mild stage: Secondary | ICD-10-CM | POA: Diagnosis not present

## 2020-01-07 DIAGNOSIS — S76311A Strain of muscle, fascia and tendon of the posterior muscle group at thigh level, right thigh, initial encounter: Secondary | ICD-10-CM | POA: Diagnosis not present

## 2020-01-10 DIAGNOSIS — Z23 Encounter for immunization: Secondary | ICD-10-CM | POA: Diagnosis not present

## 2020-03-25 DIAGNOSIS — H401131 Primary open-angle glaucoma, bilateral, mild stage: Secondary | ICD-10-CM | POA: Diagnosis not present

## 2020-08-05 ENCOUNTER — Encounter (HOSPITAL_COMMUNITY): Payer: Medicare Other

## 2020-08-05 ENCOUNTER — Ambulatory Visit: Payer: Medicare Other | Admitting: Vascular Surgery

## 2020-08-13 NOTE — Progress Notes (Signed)
HISTORY AND PHYSICAL     CC:  follow up. Requesting Provider:  Johny BlamerHarris, [name], MD  HPI: This is a 70 y.o. male who is here today for follow up for PAD.  He has hx of ABF bypass with 14 x 8 Dacron graft and bilateral iliofemoral endarterectomies 2017.  Subsequently in 08/2016 he developed a right groin fluid collection requiring I&D and VAC placement and ultimately completed antibiotics over 2 months and followed by ID.  He previously was followed by Dr. Imogene Burnhen and is now followed by Dr. Chestine Sporelark.   Pt was last seen April 2021 and at that time, he was doing well without any rest pain or tissue loss.  His right groin remained healed without any further issues.  He had quit smoking.  He had palpable femoral pulses bilaterally.  There was an incidental finding of a high grade right SFA stenosis but in the absence of claudication or other lower extremity sx, would not require an intervention.  His ABI's were 0.88 on the right and 1.05 on th eleft.  He was scheduled for one year f/u surveillance.   The pt returns today for follow up.  He is doing well today.  He does not have any rest pain or non healing wounds.  He does have some pain in his right thigh but he feels this is due to back issues.  He can get this pain whether he is walking or sitting.  He states he can walk a mile and he does this 4x/week without issues.    Pt remembers me taking his staples out in the hospital.  He states that his wife is 3x cancer survivor with breast and lung cancer.   The pt is on a statin for cholesterol management.    The pt is on an aspirin.    Other AC:  none The pt is on CCB, for hypertension.  The pt does not have diabetes. Tobacco hx:  former    Past Medical History:  Diagnosis Date  . Cataracts, bilateral    immature  . Chronic back pain    ruptured disc  . Chronic kidney disease    Stage III  Moderate  . Glaucoma    uses eye drops nightly  . History of bronchitis as a child   . Hyperlipidemia     takes Tricor and Vytorin daily  . Hypertension    takes Diovan HCT daily  . Hypertension 04/10/2015  . Nocturia   . Peripheral vascular disease [facility](HCC)     Past Surgical History:  Procedure Laterality Date  . AORTA - BILATERAL FEMORAL ARTERY BYPASS GRAFT N/A 05/21/2015   Procedure: AORTOBIFEMORAL BYPASS GRAFT USING HEMASHIELD GOLD 14X8MM X 40CM VASCULAR GRAFT;  Surgeon: Fransisco HertzBrian L Chen, MD;  [facility];  Service: Vascular;  Laterality: N/A;  . APPLICATION OF WOUND VAC Right 08/28/2016   Procedure: APPLICATION OF WOUND VAC;  Surgeon: Fransisco Hertzhen, Brian L, MD;  [facility];  Service: Vascular;  Laterality: Right;  . AXILLARY-FEMORAL BYPASS GRAFT Right 08/28/2016   Procedure: RIGHT GROIN EXPLORATION , IRRIGATION AND DEBRIDEMENT OF RIGHT GROIN FLUID COLLECTION,  Placement of Antibiotic Beads VAC PLACEMENT;  Surgeon: Fransisco Hertzhen, Brian L, MD;  [facility];  Service: Vascular;  Laterality: Right;  . ENDARTERECTOMY FEMORAL Bilateral 05/21/2015   Procedure: ENDARTERECTOMY BILATERAL FEMORAL ARTERIES;  Surgeon: Fransisco HertzBrian L Chen, MD;  [facility];  Service: Vascular;  Laterality: Bilateral;  . I & D EXTREMITY Right 09/07/2016   Procedure: RIGHT GROIN  WASHOUT WITH REMOVAL BEADS AND WOUND VAC PLACEMENT;  Surgeon: Fransisco Hertz, MD;  Location: [facility] OR;  Service: Vascular;  Laterality: Right;  . INSERTION OF DIALYSIS CATHETER N/A 05/23/2015   Procedure: INSERTION OF DIALYSIS CATHETER left internal jugular;  Surgeon: Chuck Hint, MD;  Location: [facility] OR;  Service: Vascular;  Laterality: N/A;  . IR FLUORO GUIDE CV LINE RIGHT  08/31/2016  . IR REMOVAL TUN CV CATH W/O FL  10/12/2016  . IR US GUIDE VASC ACCESS RIGHT  08/31/2016  . PATCH ANGIOPLASTY Left 05/21/2015   Procedure: LEFT FEMORAL ARTERY PATCH ANGIOPLASTY USING Livia Snellen BIOLOGIC PATCH;  Surgeon: Fransisco Hertz, MD;  Location: [facility] OR;  Service: Vascular;  Laterality: Left;  . PERIPHERAL VASCULAR CATHETERIZATION N/A 03/13/2015   Procedure: Abdominal Aortogram;  Surgeon:  Fransisco Hertz, MD;  Location: [facility] INVASIVE CV LAB;  Service: Cardiovascular;  Laterality: N/A;    Allergies  Allergen Reactions  . Valsartan-Hydrochlorothiazide Other (See Comments)    Renal failure    Current Outpatient Medications  Medication Sig Dispense Refill  . amLODipine (NORVASC) 10 MG tablet Take 10 mg by mouth daily.  5  . amoxicillin (AMOXIL) 500 MG capsule Take 1 capsule (500 mg total) by mouth 3 (three) times daily. (Patient not taking: Reported on 12/26/2017) 90 capsule 4  . aspirin 81 MG tablet Take 81 mg by mouth daily.    . ciclopirox (LOPROX) 0.77 % cream Apply 1 application topically 2 (two) times daily as needed. Gel    . COMBIGAN 0.2-0.5 % ophthalmic solution INSTILL 1 DROP INTO LEFT EYE TWICE A DAY AS DIRECTED  1  . diphenhydrAMINE (BENADRYL) 25 mg capsule Take 25 mg by mouth at bedtime.    Marland Kitchen doxazosin (CARDURA) 4 MG tablet Take 4 mg by mouth daily.    Marland Kitchen ezetimibe-simvastatin (VYTORIN) 10-40 MG per tablet Take 1 tablet by mouth daily.    . fenofibrate (TRICOR) 145 MG tablet Take 145 mg by mouth daily.    . hydrochlorothiazide (HYDRODIURIL) 25 MG tablet Take 25 mg by mouth every morning.  11  . PRESCRIPTION MEDICATION Place 1 drop into both eyes daily. Vzulta    . Sodium Chloride, Hypertonic, (SODIUM CHLORIDE OP) Place 1 drop into both eyes 2 (two) times daily.    . traMADol (ULTRAM) 50 MG tablet Take 1 tablet (50 mg total) by mouth every 6 (six) hours as needed for moderate pain. For PAIN   PRN 20 tablet 0  . VYZULTA 0.024 % SOLN INSTILL 1 DROP INTO BOTH EYES AT BEDTIME AS DIRECTED  6   No current facility-administered medications for this visit.    Family History  Problem Relation Age of Onset  . Hyperlipidemia Father   . Peripheral vascular disease Father        amputation  . Cancer Brother        colon/bowel cancer  . Hyperlipidemia Brother   . Cancer Mother   . Liver disease Mother   . Cancer Maternal Grandmother   . Heart disease Paternal Grandmother    . Diabetes Paternal Grandmother     Social History   Socioeconomic History  . Marital status: Married    Spouse name: Not on file  . Number of children: Not on file  . Years of education: Not on file  . Highest education level: Not on file  Occupational History  . Not on file  Tobacco Use  . Smoking status: Former Games developer  . Smokeless tobacco: Never Used  .  Tobacco comment: quit smoking 11 yrs ago  Vaping Use  . Vaping Use: Never used  Substance and Sexual Activity  . Alcohol use: Yes    Alcohol/week: 3.0 standard drinks    Types: 3 Cans of beer per week    Comment: couple of beers a day  . Drug use: No  . Sexual activity: Not on file  Other Topics Concern  . Not on file  Social History Narrative   Epworth Sleepiness Scale = (as of 04/10/2015)   Social Determinants of Health   Financial Resource Strain: Not on file  Food Insecurity: Not on file  Transportation Needs: Not on file  Physical Activity: Not on file  Stress: Not on file  Social Connections: Not on file  Intimate Partner Violence: Not on file     REVIEW OF SYSTEMS:   [X]  denotes positive finding, [ ]  denotes negative finding Cardiac  Comments:  Chest pain or chest pressure:    Shortness of breath upon exertion:    Short of breath when lying flat:    Irregular heart rhythm:        Vascular    Pain in calf, thigh, or hip brought on by ambulation:    Pain in feet at night that wakes you up from your sleep:     Blood clot in your veins:    Leg swelling:         Pulmonary    Oxygen at home:    Productive cough:     Wheezing:         Neurologic    Sudden weakness in arms or legs:     Sudden numbness in arms or legs:     Sudden onset of difficulty speaking or slurred speech:    Temporary loss of vision in one eye:     Problems with dizziness:         Gastrointestinal    Blood in stool:     Vomited blood:         Genitourinary    Burning when urinating:     Blood in urine:         Psychiatric    Major depression:         Hematologic    Bleeding problems:    Problems with blood clotting too easily:        Skin    Rashes or ulcers:        Constitutional    Fever or chills:      PHYSICAL EXAMINATION:  Today's Vitals   08/14/20 0907  BP: 135/66  Pulse: 75  Resp: 20  Temp: 98.7 F (37.1 C)  TempSrc: Temporal  SpO2: 98%  Weight: 210 lb 9.6 oz (95.5 kg)  Height: 5\' 6"  (1.676 m)   Body mass index is 33.99 kg/m.   General:  WDWN in NAD; vital signs documented above Gait: Normal HENT: WNL, normocephalic Pulmonary: normal non-labored breathing , without wheezing Cardiac: regular HR, without  Murmur; without carotid bruits Abdomen: soft, NT, no masses; aortic pulse is not palpable Skin: without rashes Vascular Exam/Pulses:  Right Left  Radial 2+ (normal) 2+ (normal)  Ulnar 2+ (normal) Unable to palpate  Femoral 2+ (normal) 2+ (normal)  Popliteal Unable to palpate Unable to palpate  DP 2+ (normal) 2+ (normal)  PT 1+ (weak) 2+ (normal)   Extremities: without ischemic changes, without Gangrene , without cellulitis; without open wounds;  Musculoskeletal: no muscle wasting or atrophy  Neurologic: A&O X 3;  No  focal weakness or paresthesias are detected Psychiatric:  The pt has Normal affect.   Non-Invasive Vascular Imaging:   ABI's/TBI's on 08/14/2020: Right:  0.88/0.61 - Great toe pressure: 94 Left:  1.02/0.71 - Great toe pressure: 109  Aorto-iliac Arterial duplex on 08/14/2020: Abdominal Aorta: Patent aorto-bifemoral bypass graft with 50-74% stenosis in the proximal right superficial femoral artery.   Prox PFA outflow peak systolic velocity is 277 cm/sec. Velocities in the  proximal SFA increase from 74 cm/sec to 277 cm/sec suggestive of 50-74% stenosis. Unable to duplicate velocity of 395 cm/ sec from previous exam.  Previous ABI's/TBI's on 07/24/2019: Right:  0.88/0.58 - Great toe pressure: 911 Left:  1.05/0.74 - Great toe pressure:   116  Previous arterial duplex on 07/24/2019: Abdominal Aorta Findings:  +--------+-------+----------+----------+--------+--------+--------+  LocationAP (cm)Trans (cm)PSV (cm/s)WaveformThrombusComments  +--------+-------+----------+----------+--------+--------+--------+  Proximal         104                  +--------+-------+----------+----------+--------+--------+--------+   Right Graft #1:  +------------------+--------+--------+--------+--------+           PSV cm/sStenosisWaveformComments  +------------------+--------+--------+--------+--------+  Inflow      104                 +------------------+--------+--------+--------+--------+  Prox Anastomosis 67                 +------------------+--------+--------+--------+--------+  Proximal Graft  73                 +------------------+--------+--------+--------+--------+  Mid Graft     62                 +------------------+--------+--------+--------+--------+  Distal Graft   85       biphasic      +------------------+--------+--------+--------+--------+  Distal Anastomosis84                 +------------------+--------+--------+--------+--------+  Outflow      65       biphasic      +------------------+--------+--------+--------+--------+   Prox PFA outflow peak systolic velocity is 251 cm/sec. Velocities in the  proximal SFA increase from 78 cm/sec to 395 cm/sec suggestive of 75-99%  stenosis.   Left Graft #1: +--------------------+--------+--------+---------+--------+            PSV cm/sStenosisWaveform Comments  +--------------------+--------+--------+---------+--------+  Inflow       104                  +--------------------+--------+--------+---------+--------+  Proximal  Anastomosis67                  +--------------------+--------+--------+---------+--------+  Proximal Graft   106                  +--------------------+--------+--------+---------+--------+  Mid Graft      111       triphasic      +--------------------+--------+--------+---------+--------+  Distal Graft    126       triphasic      +--------------------+--------+--------+---------+--------+  Distal Anastomosis 145       triphasic      +--------------------+--------+--------+---------+--------+  Outflow       150       biphasic       +--------------------+--------+--------+---------+--------+  Summary:  Abdominal Aorta: Prox PFA outflow peak systolic velocity is 251 cm/sec.  Velocities in the proximal SFA increase from 78 cm/sec to 395 cm/sec  suggestive of 75-99% stenosis.  Patent aorto-bifemoral bypass graft with 75-99% proximal right SFA  stenosis.    ASSESSMENT/PLAN:: 70 y.o.  male here for follow up for ABF bypass with 14 x 8 Dacron graft and bilateral iliofemoral endarterectomies 2017.  Subsequently in 08/2016 he developed a right groin fluid collection requiring I&D and VAC placement and ultimately completed antibiotics over 2 months   -pt doing well without any rest pain, claudication or non healing wounds.  Studies today basically unchanged from one year ago.  The velocities produced last year were not able to be reproduced today and is less.  -discussed with pt to continue walking program.  He will contact us sooner if he develops non healing wounds or rest pain.  Continue statin/asa -pt will f/u in one year with aorto-iliac duplex and ABI.   Doreatha Massed, [facility] [phone]  Clinic MD:   Darrick Penna

## 2020-08-14 ENCOUNTER — Ambulatory Visit (HOSPITAL_COMMUNITY)
Admission: RE | Admit: 2020-08-14 | Discharge: 2020-08-14 | Disposition: A | Discharge status: Home / Self Care | Payer: Medicare Other | Source: Ambulatory Visit | Attending: Vascular Surgery | Admitting: Vascular Surgery

## 2020-08-14 ENCOUNTER — Ambulatory Visit: Payer: Medicare Other | Admitting: Physician Assistant

## 2020-08-14 ENCOUNTER — Ambulatory Visit (INDEPENDENT_AMBULATORY_CARE_PROVIDER_SITE_OTHER)
Admission: RE | Admit: 2020-08-14 | Discharge: 2020-08-14 | Disposition: A | Discharge status: Home / Self Care | Payer: Medicare Other | Source: Ambulatory Visit | Attending: Vascular Surgery | Admitting: Vascular Surgery

## 2020-08-14 ENCOUNTER — Other Ambulatory Visit: Payer: Self-pay

## 2020-08-14 VITALS — BP 135/66 | HR 75 | Temp 98.7°F | Resp 20 | Ht 66.0 in | Wt 210.6 lb

## 2020-08-14 DIAGNOSIS — I779 Disorder of arteries and arterioles, unspecified: Secondary | ICD-10-CM

## 2020-08-14 DIAGNOSIS — Z95828 Presence of other vascular implants and grafts: Secondary | ICD-10-CM

## 2021-09-03 ENCOUNTER — Other Ambulatory Visit: Payer: Self-pay | Admitting: *Deleted

## 2021-09-03 DIAGNOSIS — Z95828 Presence of other vascular implants and grafts: Secondary | ICD-10-CM

## 2021-09-03 DIAGNOSIS — I779 Disorder of arteries and arterioles, unspecified: Secondary | ICD-10-CM

## 2021-09-08 ENCOUNTER — Encounter (HOSPITAL_COMMUNITY): Payer: Medicare Other

## 2021-09-08 ENCOUNTER — Ambulatory Visit: Payer: Medicare Other

## 2021-09-13 NOTE — Progress Notes (Unsigned)
Office Note     CC:  follow up Requesting Provider:  Shirline Frees, MD  HPI: Robert [name] is a 71 y.o. (12/05/1950) male who presents for routine follow up for PAD.  He has hx of ABF bypass with 14 x 8 Dacron graft and bilateral iliofemoral endarterectomies 2017 by Dr. Bridgett Larsson.  Subsequently in 08/2016 he developed a right groin fluid collection requiring I&D and VAC placement and ultimately completed antibiotics over 2 months and followed by ID.  He previously was followed by Dr. Bridgett Larsson and is now followed by Dr. Carlis Abbott.    Pt was last seen May of 2022 and at that time he was doing well today. Without any rest pain or non healing wounds.  He was walking a mile 4x/week without issues.    Today he reports ***    The pt is on a statin for cholesterol management.    The pt is on an aspirin.    Other AC:  none The pt is on CCB, for hypertension.  The pt does not have diabetes. Tobacco hx:  former  Past Medical History:  Diagnosis Date   Cataracts, bilateral    immature   Chronic back pain    ruptured disc   Chronic kidney disease    Stage III  Moderate   Glaucoma    uses eye drops nightly   History of bronchitis as a child    Hyperlipidemia    takes Tricor and Vytorin daily   Hypertension    takes Diovan HCT daily   Hypertension 04/10/2015   Nocturia    Peripheral vascular disease ([place])     Past Surgical History:  Procedure Laterality Date   AORTA - BILATERAL FEMORAL ARTERY BYPASS GRAFT N/A 05/21/2015   Procedure: AORTOBIFEMORAL BYPASS GRAFT USING HEMASHIELD GOLD 14X8MM X 40CM VASCULAR GRAFT;  Surgeon: Conrad [place], MD;  Location: [place];  Service: Vascular;  Laterality: N/A;   APPLICATION OF WOUND VAC Right 08/28/2016   Procedure: APPLICATION OF WOUND VAC;  Surgeon: Conrad Altamont, MD;  Location: [place];  Service: Vascular;  Laterality: Right;   AXILLARY-FEMORAL BYPASS GRAFT Right 08/28/2016   Procedure: RIGHT GROIN EXPLORATION , IRRIGATION AND DEBRIDEMENT OF RIGHT GROIN FLUID  COLLECTION,  Placement of Antibiotic Beads VAC PLACEMENT;  Surgeon: Conrad [place], MD;  Location: [place];  Service: Vascular;  Laterality: Right;   ENDARTERECTOMY FEMORAL Bilateral 05/21/2015   Procedure: ENDARTERECTOMY BILATERAL FEMORAL ARTERIES;  Surgeon: Conrad Milan, MD;  Location: [place];  Service: Vascular;  Laterality: Bilateral;   I & D EXTREMITY Right 09/07/2016   Procedure: RIGHT GROIN WASHOUT WITH REMOVAL BEADS AND WOUND VAC PLACEMENT;  Surgeon: Conrad Nicollet, MD;  Location: [place];  Service: Vascular;  Laterality: Right;   INSERTION OF DIALYSIS CATHETER N/A 05/23/2015   Procedure: INSERTION OF DIALYSIS CATHETER left internal jugular;  Surgeon: Angelia Mould, MD;  Location: [place];  Service: Vascular;  Laterality: N/A;   IR FLUORO GUIDE CV LINE RIGHT  08/31/2016   IR REMOVAL TUN CV CATH W/O FL  10/12/2016   IR US GUIDE VASC ACCESS RIGHT  08/31/2016   PATCH ANGIOPLASTY Left 05/21/2015   Procedure: LEFT FEMORAL ARTERY PATCH ANGIOPLASTY USING XENOSURE BIOLOGIC PATCH;  Surgeon: Conrad , MD;  Location: [place];  Service: Vascular;  Laterality: Left;   PERIPHERAL VASCULAR CATHETERIZATION N/A 03/13/2015   Procedure: Abdominal Aortogram;  Surgeon: Conrad , MD;  Location: [place] CV LAB;  Service: Cardiovascular;  Laterality: N/A;    Social History   Socioeconomic History   Marital status: Married    Spouse name: Not on file   Number of children: Not on file   Years of education: Not on file   Highest education level: Not on file  Occupational History   Not on file  Tobacco Use   Smoking status: Former   Smokeless tobacco: Never   Tobacco comments:    quit smoking 11 yrs ago  Vaping Use   Vaping Use: Never used  Substance and Sexual Activity   Alcohol use: Yes    Alcohol/week: 3.0 standard drinks    Types: 3 Cans of beer per week    Comment: couple of beers a day   Drug use: No   Sexual activity: Not on file  Other Topics Concern   Not on file  Social History  Narrative   Epworth Sleepiness Scale = (as of 04/10/2015)   Social Determinants of Health   Financial Resource Strain: Not on file  Food Insecurity: Not on file  Transportation Needs: Not on file  Physical Activity: Not on file  Stress: Not on file  Social Connections: Not on file  Intimate Partner Violence: Not on file   *** Family History  Problem Relation Age of Onset   Hyperlipidemia Father    Peripheral vascular disease Father        amputation   Cancer Brother        colon/bowel cancer   Hyperlipidemia Brother    Cancer Mother    Liver disease Mother    Cancer Maternal Grandmother    Heart disease Paternal Grandmother    Diabetes Paternal Grandmother     Current Outpatient Medications  Medication Sig Dispense Refill   amLODipine (NORVASC) 10 MG tablet Take 10 mg by mouth daily.  5   amoxicillin (AMOXIL) 500 MG capsule Take 1 capsule (500 mg total) by mouth 3 (three) times daily. (Patient not taking: No sig reported) 90 capsule 4   aspirin 81 MG tablet Take 81 mg by mouth daily.     ciclopirox (LOPROX) 0.77 % cream Apply 1 application topically 2 (two) times daily as needed. Gel (Patient not taking: Reported on 08/14/2020)     COMBIGAN 0.2-0.5 % ophthalmic solution INSTILL 1 DROP INTO LEFT EYE TWICE A DAY AS DIRECTED  1   diphenhydrAMINE (BENADRYL) 25 mg capsule Take 25 mg by mouth at bedtime.     dorzolamide-timolol (COSOPT) 22.3-6.8 MG/ML ophthalmic solution PLACE 1 DROP IN LEFT EYE TWICE A DAY     doxazosin (CARDURA) 4 MG tablet Take 4 mg by mouth daily.     ezetimibe-simvastatin (VYTORIN) 10-40 MG per tablet Take 1 tablet by mouth daily.     fenofibrate (TRICOR) 145 MG tablet Take 145 mg by mouth daily.     hydrochlorothiazide (HYDRODIURIL) 25 MG tablet Take 25 mg by mouth every morning.  11   PRESCRIPTION MEDICATION Place 1 drop into both eyes daily. Vzulta     Sodium Chloride, Hypertonic, (SODIUM CHLORIDE OP) Place 1 drop into both eyes 2 (two) times daily.      traMADol (ULTRAM) 50 MG tablet Take 1 tablet (50 mg total) by mouth every 6 (six) hours as needed for moderate pain. For PAIN   PRN 20 tablet 0   VYZULTA 0.024 % SOLN INSTILL 1 DROP INTO BOTH EYES AT BEDTIME AS DIRECTED  6   No current facility-administered medications for this visit.    Allergies  Allergen  Reactions   Valsartan-Hydrochlorothiazide Other (See Comments)    Renal failure     REVIEW OF SYSTEMS:  *** [X]  denotes positive finding, [ ]  denotes negative finding Cardiac  Comments:  Chest pain or chest pressure:    Shortness of breath upon exertion:    Short of breath when lying flat:    Irregular heart rhythm:        Vascular    Pain in calf, thigh, or hip brought on by ambulation:    Pain in feet at night that wakes you up from your sleep:     Blood clot in your veins:    Leg swelling:         Pulmonary    Oxygen at home:    Productive cough:     Wheezing:         Neurologic    Sudden weakness in arms or legs:     Sudden numbness in arms or legs:     Sudden onset of difficulty speaking or slurred speech:    Temporary loss of vision in one eye:     Problems with dizziness:         Gastrointestinal    Blood in stool:     Vomited blood:         Genitourinary    Burning when urinating:     Blood in urine:        Psychiatric    Major depression:         Hematologic    Bleeding problems:    Problems with blood clotting too easily:        Skin    Rashes or ulcers:        Constitutional    Fever or chills:      PHYSICAL EXAMINATION:  There were no vitals filed for this visit.  General:  WDWN in NAD; vital signs documented above Gait: Not observed HENT: WNL, normocephalic Pulmonary: normal non-labored breathing , without Rales, rhonchi,  wheezing Cardiac: {Desc; regular/irreg:14544} HR, without  Murmurs {With/Without:20273} carotid bruit*** Abdomen: soft, NT, no masses Skin: {With/Without:20273} rashes Vascular Exam/Pulses:  Right Left   Radial {Exam; arterial pulse strength 0-4:30167} {Exam; arterial pulse strength 0-4:30167}  Ulnar {Exam; arterial pulse strength 0-4:30167} {Exam; arterial pulse strength 0-4:30167}  Femoral {Exam; arterial pulse strength 0-4:30167} {Exam; arterial pulse strength 0-4:30167}  Popliteal {Exam; arterial pulse strength 0-4:30167} {Exam; arterial pulse strength 0-4:30167}  DP {Exam; arterial pulse strength 0-4:30167} {Exam; arterial pulse strength 0-4:30167}  PT {Exam; arterial pulse strength 0-4:30167} {Exam; arterial pulse strength 0-4:30167}   Extremities: {With/Without:20273} ischemic changes, {With/Without:20273} Gangrene , {With/Without:20273} cellulitis; {With/Without:20273} open wounds;  Musculoskeletal: no muscle wasting or atrophy  Neurologic: A&O X 3;  No focal weakness or paresthesias are detected Psychiatric:  The pt has {Desc; normal/abnormal:11317::"Normal"} affect.   Non-Invasive Vascular Imaging:   ***    ASSESSMENT/PLAN:: 71 y.o. male here for follow up for PAD.  He has hx of ABF bypass with 14 x 8 Dacron graft and bilateral iliofemoral endarterectomies 2017 by Dr. Bridgett Larsson.    -***   Karoline Caldwell, PA-C [facility] [phone]  Clinic MD:   Carlis Abbott

## 2021-09-15 ENCOUNTER — Encounter (HOSPITAL_COMMUNITY): Payer: Medicare Other

## 2021-09-15 ENCOUNTER — Ambulatory Visit: Payer: Medicare Other

## 2021-09-15 DIAGNOSIS — Z95828 Presence of other vascular implants and grafts: Secondary | ICD-10-CM

## 2021-09-15 DIAGNOSIS — I779 Disorder of arteries and arterioles, unspecified: Secondary | ICD-10-CM

## 2021-10-14 DIAGNOSIS — G8929 Other chronic pain: Secondary | ICD-10-CM | POA: Insufficient documentation

## 2021-10-14 DIAGNOSIS — M545 Low back pain, unspecified: Secondary | ICD-10-CM | POA: Insufficient documentation

## 2021-10-14 DIAGNOSIS — F419 Anxiety disorder, unspecified: Secondary | ICD-10-CM | POA: Insufficient documentation

## 2021-10-14 DIAGNOSIS — G5710 Meralgia paresthetica, unspecified lower limb: Secondary | ICD-10-CM | POA: Insufficient documentation

## 2021-10-14 DIAGNOSIS — E7800 Pure hypercholesterolemia, unspecified: Secondary | ICD-10-CM | POA: Insufficient documentation

## 2021-10-14 DIAGNOSIS — R7303 Prediabetes: Secondary | ICD-10-CM | POA: Insufficient documentation

## 2021-10-14 NOTE — Progress Notes (Unsigned)
[facility] [place] HISTORY AND PHYSICAL   History of Present Illness:  Patient is a 71 y.o. year old male who presents for evaluation of PAD.  He is s/p ABF bypass with 14 x 8 Dacron graft and bilateral iliofemoral endarterectomies 2017 by Dr. Bridgett Larsson.  Subsequently in 08/2016 he developed a right groin fluid collection requiring I&D and VAC placement and ultimately completed antibiotics over 2 months and followed by ID.   He denies rest pain, claudication, or non healing wounds.  He denies fever and chills as well.    The pt is on a statin for cholesterol management.    The pt is on an aspirin.    Other AC:  none The pt is on CCB, for hypertension.  The pt does not have diabetes. Tobacco hx:  former    Past Medical History:  Diagnosis Date   Cataracts, bilateral    immature   Chronic back pain    ruptured disc   Chronic kidney disease    Stage III  Moderate   Glaucoma    uses eye drops nightly   History of bronchitis as a child    Hyperlipidemia    takes Tricor and Vytorin daily   Hypertension    takes Diovan HCT daily   Hypertension 04/10/2015   Nocturia    Peripheral vascular disease ([place])     Past Surgical History:  Procedure Laterality Date   AORTA - BILATERAL FEMORAL ARTERY BYPASS GRAFT N/A 05/21/2015   Procedure: AORTOBIFEMORAL BYPASS GRAFT USING HEMASHIELD GOLD 14X8MM X 40CM VASCULAR GRAFT;  Surgeon: Conrad Trimble, MD;  Location: [place];  Service: Vascular;  Laterality: N/A;   APPLICATION OF WOUND VAC Right 08/28/2016   Procedure: APPLICATION OF WOUND VAC;  Surgeon: Conrad [place], MD;  Location: [place];  Service: Vascular;  Laterality: Right;   AXILLARY-FEMORAL BYPASS GRAFT Right 08/28/2016   Procedure: RIGHT GROIN EXPLORATION , IRRIGATION AND DEBRIDEMENT OF RIGHT GROIN FLUID COLLECTION,  Placement of Antibiotic Beads VAC PLACEMENT;  Surgeon: Conrad Coeburn, MD;  Location: [place];  Service: Vascular;  Laterality: Right;   ENDARTERECTOMY FEMORAL Bilateral  05/21/2015   Procedure: ENDARTERECTOMY BILATERAL FEMORAL ARTERIES;  Surgeon: Conrad [place], MD;  Location: [place];  Service: Vascular;  Laterality: Bilateral;   I & D EXTREMITY Right 09/07/2016   Procedure: RIGHT GROIN WASHOUT WITH REMOVAL BEADS AND WOUND VAC PLACEMENT;  Surgeon: Conrad Greeneville, MD;  Location: [place];  Service: Vascular;  Laterality: Right;   INSERTION OF DIALYSIS CATHETER N/A 05/23/2015   Procedure: INSERTION OF DIALYSIS CATHETER left internal jugular;  Surgeon: Angelia Mould, MD;  Location: [place];  Service: Vascular;  Laterality: N/A;   IR FLUORO GUIDE CV LINE RIGHT  08/31/2016   IR REMOVAL TUN CV CATH W/O FL  10/12/2016   IR US GUIDE VASC ACCESS RIGHT  08/31/2016   PATCH ANGIOPLASTY Left 05/21/2015   Procedure: LEFT FEMORAL ARTERY PATCH ANGIOPLASTY USING XENOSURE BIOLOGIC PATCH;  Surgeon: Conrad [place], MD;  Location: [place];  Service: Vascular;  Laterality: Left;   PERIPHERAL VASCULAR CATHETERIZATION N/A 03/13/2015   Procedure: Abdominal Aortogram;  Surgeon: Conrad Yaurel, MD;  Location: [facility];  Service: Cardiovascular;  Laterality: N/A;    ROS:   General:  No weight loss, Fever, chills  HEENT: No recent headaches, no nasal bleeding, no visual changes, no sore throat  Neurologic: No dizziness, blackouts, seizures. No recent symptoms of stroke or mini- stroke.  No recent episodes of slurred speech, or temporary blindness.  Cardiac: No recent episodes of chest pain/pressure, no shortness of breath at rest.  No shortness of breath with exertion.  Denies history of atrial fibrillation or irregular heartbeat  Vascular: No history of rest pain in feet.  No history of claudication.  No history of non-healing ulcer, No history of DVT   Pulmonary: No home oxygen, no productive cough, no hemoptysis,  No asthma or wheezing  Musculoskeletal:  [ ]  Arthritis, [ ]  Low back pain,  [ ]  Joint pain  Hematologic:No history of hypercoagulable state.  No history of easy bleeding.  No  history of anemia  Gastrointestinal: No hematochezia or melena,  No gastroesophageal reflux, no trouble swallowing  Urinary: [ ]  chronic Kidney disease, [ ]  on HD - [ ]  MWF or [ ]  TTHS, [ ]  Burning with urination, [ ]  Frequent urination, [ ]  Difficulty urinating;   Skin: No rashes  Psychological: No history of anxiety,  No history of depression  Social History Social History   Tobacco Use   Smoking status: Former    Passive exposure: Never   Smokeless tobacco: Never   Tobacco comments:    quit smoking 11 yrs ago  Vaping Use   Vaping Use: Never used  Substance Use Topics   Alcohol use: Yes    Alcohol/week: 3.0 standard drinks of alcohol    Types: 3 Cans of beer per week    Comment: couple of beers a day   Drug use: No    Family History Family History  Problem Relation Age of Onset   Hyperlipidemia Father    Peripheral vascular disease Father        amputation   Cancer Brother        colon/bowel cancer   Hyperlipidemia Brother    Cancer Mother    Liver disease Mother    Cancer Maternal Grandmother    Heart disease Paternal Grandmother    Diabetes Paternal Grandmother     Allergies  Allergies  Allergen Reactions   Valsartan-Hydrochlorothiazide Other (See Comments)    Renal failure     Current Outpatient Medications  Medication Sig Dispense Refill   amLODipine (NORVASC) 10 MG tablet Take 10 mg by mouth daily.  5   aspirin 81 MG tablet Take 81 mg by mouth daily.     diphenhydrAMINE (BENADRYL) 25 mg capsule Take 25 mg by mouth at bedtime.     dorzolamide-timolol (COSOPT) 22.3-6.8 MG/ML ophthalmic solution PLACE 1 DROP IN LEFT EYE TWICE A DAY     doxazosin (CARDURA) 4 MG tablet Take 4 mg by mouth daily.     ezetimibe-simvastatin (VYTORIN) 10-40 MG per tablet Take 1 tablet by mouth daily.     fenofibrate (TRICOR) 145 MG tablet Take 145 mg by mouth daily.     hydrochlorothiazide (HYDRODIURIL) 25 MG tablet Take 25 mg by mouth every morning.  11   traMADol  (ULTRAM) 50 MG tablet Take 1 tablet (50 mg total) by mouth every 6 (six) hours as needed for moderate pain. For PAIN   PRN 20 tablet 0   VYZULTA 0.024 % SOLN INSTILL 1 DROP INTO BOTH EYES AT BEDTIME AS DIRECTED  6   No current facility-administered medications for this visit.    Physical Examination  Vitals:   10/15/21 0857  BP: (!) 144/71  Pulse: 74  Resp: 20  Temp: 98.1 F (36.7 C)  TempSrc: Temporal  SpO2: 95%  Weight: 216 lb 1.6 oz (98  kg)  Height: 5\' 6"  (1.676 m)    Body mass index is 34.88 kg/m.  General:  Alert and oriented, no acute distress HEENT: Normal Neck: No bruit or JVD Pulmonary: Clear to auscultation bilaterally Cardiac: Regular Rate and Rhythm without murmur Abdomen: Soft, non-tender, non-distended, no mass, no scars Skin: No rash Extremity Pulses:  2+ radial, brachial, femoral, pulses bilaterally Musculoskeletal: No deformity or edema  Neurologic: Upper and lower extremity motor 5/5 and symmetric  DATA:   ABI Findings:  +---------+------------------+-----+---------+--------+  Right    Rt Pressure (mmHg)IndexWaveform Comment   +---------+------------------+-----+---------+--------+  Brachial 150                                       +---------+------------------+-----+---------+--------+  PTA      134               0.89 biphasic           +---------+------------------+-----+---------+--------+  DP       134               0.89 triphasic          +---------+------------------+-----+---------+--------+  Great Toe87                0.58                    +---------+------------------+-----+---------+--------+   +---------+------------------+-----+---------+-------+  Left     Lt Pressure (mmHg)IndexWaveform Comment  +---------+------------------+-----+---------+-------+  Brachial 148                                      +---------+------------------+-----+---------+-------+  PTA      161                1.07 biphasic          +---------+------------------+-----+---------+-------+  DP       164               1.09 triphasic         +---------+------------------+-----+---------+-------+  Great Toe111               0.74                   +---------+------------------+-----+---------+-------+   +-------+-----------+-----------+------------+------------+  ABI/TBIToday's ABIToday's TBIPrevious ABIPrevious TBI  +-------+-----------+-----------+------------+------------+  Right  0.89       0.58       0.88        0.61          +-------+-----------+-----------+------------+------------+  Left   1.09       0.74       1.02        0.71          +-------+-----------+-----------+------------+------------+      Bilateral ABIs and TBIs appear essentially unchanged compared to prior  study on 08/14/2020.     Summary:  Right: Resting right ankle-brachial index indicates mild right lower  extremity arterial disease. The right toe-brachial index is abnormal.   Left: Resting left ankle-brachial index is within normal range. No  evidence of significant left lower extremity arterial disease. The left  toe-brachial index is normal.       Right Graft #1:  +-------------------------+--------+--------+--------+---------------------  ----+  Aortobifemoral right limbPSV cm/sStenosisWaveformComments                    +-------------------------+--------+--------+--------+---------------------  ----+  Inflow                   98                                                  +-------------------------+--------+--------+--------+---------------------  ----+  Prox Anastomosis         90                                                  +-------------------------+--------+--------+--------+---------------------  ----+  Proximal Graft           79                      Limited  visualization due                                                    to bowel gas,  prox-mid                                                      velocity obtained            +-------------------------+--------+--------+--------+---------------------  ----+  Mid Graft                76                                                  +-------------------------+--------+--------+--------+---------------------  ----+  Distal Graft             125                                                 +-------------------------+--------+--------+--------+---------------------  ----+  Distal Anastomosis       83                                                  +-------------------------+--------+--------+--------+---------------------  ----+  Outflow                  225                     superficial femoral  artery                      +-------------------------+--------+--------+--------+---------------------  ----+   Proximal SFA outflow peak systolic velocity is 123456 cm/s (velocities  increase from 88 cm/s to 225 cm/s which is suggestive of a 50-74%  stenosis).   Left Graft #1:  +--------------------+--------+--------+--------+--------------------------  ----+                      PSV cm/sStenosisWaveformComments                          +--------------------+--------+--------+--------+--------------------------  ----+  Inflow              98                                                        +--------------------+--------+--------+--------+--------------------------  ----+  Proximal Anastomosis90                                                        +--------------------+--------+--------+--------+--------------------------  ----+  Proximal Graft      125                     Limited visualization due  to                                                bowel gas, prox-mid  velocity                                                 obtained                          +--------------------+--------+--------+--------+--------------------------  ----+  Mid Graft           120                                                       +--------------------+--------+--------+--------+--------------------------  ----+  Distal Graft        101                                                       +--------------------+--------+--------+--------+--------------------------  ----+  Distal Anastamosis  148                                                       +--------------------+--------+--------+--------+--------------------------  ----+  Outflow             192                                                       +--------------------+--------+--------+--------+--------------------------  ----+      Summary:     Patent aorto-bifemoral bypass graft with elevated velocities in the  proximal superficial femoral artery suggestive of a 50-74% stenosis.   ASSESSMENT:  PAD with history of sever claudication symptoms B LE S/P aortobifemoral bypass performed in 2017 by Dr. Bridgett Larsson.  He has elevated velocities SFA B 50-74%.  He remains asymptomatic for new PAD issues.  He denies rest pain, non healing wounds or claudication at this time.    PLAN: We will arrange follow-up again in 1 year with aortoiliac duplex and ABIs for ongoing surveillance.   If he has problems or concerns he will call our office.    Roxy Horseman PA-C [facility] Office: [phone]   MD in clinic [place]

## 2021-10-15 ENCOUNTER — Ambulatory Visit: Payer: Medicare Other | Admitting: Physician Assistant

## 2021-10-15 ENCOUNTER — Ambulatory Visit (INDEPENDENT_AMBULATORY_CARE_PROVIDER_SITE_OTHER)
Admission: RE | Admit: 2021-10-15 | Discharge: 2021-10-15 | Disposition: A | Discharge status: Home / Self Care | Payer: Medicare Other | Source: Ambulatory Visit | Attending: Vascular Surgery | Admitting: Vascular Surgery

## 2021-10-15 ENCOUNTER — Ambulatory Visit (HOSPITAL_COMMUNITY)
Admission: RE | Admit: 2021-10-15 | Discharge: 2021-10-15 | Disposition: A | Discharge status: Home / Self Care | Payer: Medicare Other | Source: Ambulatory Visit | Attending: Vascular Surgery | Admitting: Vascular Surgery

## 2021-10-15 VITALS — BP 144/71 | HR 74 | Temp 98.1°F | Resp 20 | Ht 66.0 in | Wt 216.1 lb

## 2021-10-15 DIAGNOSIS — Z95828 Presence of other vascular implants and grafts: Secondary | ICD-10-CM

## 2021-10-15 DIAGNOSIS — I779 Disorder of arteries and arterioles, unspecified: Secondary | ICD-10-CM

## 2022-10-08 ENCOUNTER — Other Ambulatory Visit: Payer: Self-pay | Admitting: *Deleted

## 2022-10-08 DIAGNOSIS — I779 Disorder of arteries and arterioles, unspecified: Secondary | ICD-10-CM

## 2022-10-08 DIAGNOSIS — Z95828 Presence of other vascular implants and grafts: Secondary | ICD-10-CM

## 2022-10-26 ENCOUNTER — Ambulatory Visit (HOSPITAL_COMMUNITY)
Admission: RE | Admit: 2022-10-26 | Discharge: 2022-10-26 | Disposition: A | Discharge status: Home / Self Care | Payer: Medicare Other | Source: Ambulatory Visit | Attending: Vascular Surgery | Admitting: Vascular Surgery

## 2022-10-26 ENCOUNTER — Ambulatory Visit: Payer: Medicare Other | Admitting: Physician Assistant

## 2022-10-26 VITALS — BP 160/76 | HR 75 | Temp 98.2°F | Resp 18 | Ht 66.0 in | Wt 217.6 lb

## 2022-10-26 DIAGNOSIS — Z95828 Presence of other vascular implants and grafts: Secondary | ICD-10-CM | POA: Insufficient documentation

## 2022-10-26 DIAGNOSIS — I779 Disorder of arteries and arterioles, unspecified: Secondary | ICD-10-CM

## 2022-10-26 LAB — VAS US ABI WITH/WO TBI
Left ABI: 1.04
Right ABI: 1.01

## 2022-10-26 NOTE — Progress Notes (Signed)
[facility]  HISTORY AND PHYSICAL   History of Present Illness:  Patient is a 72 y.o. year old male who presents for evaluation of PAD.  He is s/p ABF bypass with 14 x 8 Dacron graft and bilateral iliofemoral endarterectomies 2017 by Dr. Imogene Burn.  Subsequently in 08/2016 he developed a right groin fluid collection requiring I&D and VAC placement and ultimately completed antibiotics over 2 months and followed by ID.              He denies rest pain, claudication, or non healing wounds.  He denies fever and chills as well.  He walks up to 3 miles a day in intervals and has a pool in the back yard that keeps him busy.     He is medically managed on Statin and ASA 81mg    Past Surgical History:  Procedure Laterality Date   AORTA - BILATERAL FEMORAL ARTERY BYPASS GRAFT N/A 05/21/2015   Procedure: AORTOBIFEMORAL BYPASS GRAFT USING HEMASHIELD GOLD 14X8MM X 40CM VASCULAR GRAFT;  Surgeon: Fransisco Hertz, MD;  Location: [facility];  Service: Vascular;  Laterality: N/A;   APPLICATION OF WOUND VAC Right 08/28/2016   Procedure: APPLICATION OF WOUND VAC;  Surgeon: Fransisco Hertz, MD;  Location[facility];  Service: Vascular;  Laterality: Right;   AXILLARY-FEMORAL BYPASS GRAFT Right 08/28/2016   Procedure: RIGHT GROIN EXPLORATION , IRRIGATION AND DEBRIDEMENT OF RIGHT GROIN FLUID COLLECTION,  Placement of Antibiotic Beads VAC PLACEMENT;  Surgeon: Fransisco Hertz, MD;  Location: [facility];  Service: Vascular;  Laterality: Right;   ENDARTERECTOMY FEMORAL Bilateral 05/21/2015   Procedure: ENDARTERECTOMY BILATERAL FEMORAL ARTERIES;  Surgeon: Fransisco Hertz, MD;  Location: [facility] OR;  Service: Vascular;  Laterality: Bilateral;   I & D EXTREMITY Right 09/07/2016   Procedure: RIGHT GROIN WASHOUT WITH REMOVAL BEADS AND WOUND VAC PLACEMENT;  Surgeon: Fransisco Hertz, MD;  Location: [facility];  Service: Vascular;  Laterality: Right;   INSERTION OF DIALYSIS CATHETER N/A 05/23/2015   Procedure: INSERTION OF DIALYSIS CATHETER left  internal jugular;  Surgeon: Chuck Hint, MD;  Location: [facility];  Service: Vascular;  Laterality: N/A;   IR FLUORO GUIDE CV LINE RIGHT  08/31/2016   IR REMOVAL TUN CV CATH W/O FL  10/12/2016   IR US GUIDE VASC ACCESS RIGHT  08/31/2016   PATCH ANGIOPLASTY Left 05/21/2015   Procedure: LEFT FEMORAL ARTERY PATCH ANGIOPLASTY USING XENOSURE BIOLOGIC PATCH;  Surgeon: Fransisco Hertz, MD;  Location: [facility] OR;  Service: Vascular;  Laterality: Left;   PERIPHERAL VASCULAR CATHETERIZATION N/A 03/13/2015   Procedure: Abdominal Aortogram;  Surgeon: Fransisco Hertz, MD;  Location: [facility];  Service: Cardiovascular;  Laterality: N/A;    ROS:   General:  No weight loss, Fever, chills  HEENT: No recent headaches, no nasal bleeding, no visual changes, no sore throat  Neurologic: No dizziness, blackouts, seizures. No recent symptoms of stroke or mini- stroke. No recent episodes of slurred speech, or temporary blindness.  Cardiac: No recent episodes of chest pain/pressure, no shortness of breath at rest.  No shortness of breath with exertion.  Denies history of atrial fibrillation or irregular heartbeat  Vascular: No history of rest pain in feet.  No history of claudication.  No history of non-healing ulcer, No history of DVT   Pulmonary: No home oxygen, no productive cough, no hemoptysis,  No asthma or wheezing  Musculoskeletal:  [ ]  Arthritis, [ ]  Low back pain,  [ ]  Joint  pain  Hematologic:No history of hypercoagulable state.  No history of easy bleeding.  No history of anemia  Gastrointestinal: No hematochezia or melena,  No gastroesophageal reflux, no trouble swallowing  Urinary: [ ]  chronic Kidney disease, [ ]  on HD - [ ]  MWF or [ ]  TTHS, [ ]  Burning with urination, [ ]  Frequent urination, [ ]  Difficulty urinating;   Skin: No rashes  Psychological: No history of anxiety,  No history of depression  Social History Social History   Tobacco Use   Smoking status: Former    Passive exposure:  Never   Smokeless tobacco: Never   Tobacco comments:    quit smoking 11 yrs ago  Vaping Use   Vaping status: Never Used  Substance Use Topics   Alcohol use: Yes    Alcohol/week: 3.0 standard drinks of alcohol    Types: 3 Cans of beer per week    Comment: couple of beers a day   Drug use: No    Family History Family History  Problem Relation Age of Onset   Hyperlipidemia Father    Peripheral vascular disease Father        amputation   Cancer Brother        colon/bowel cancer   Hyperlipidemia Brother    Cancer Mother    Liver disease Mother    Cancer Maternal Grandmother    Heart disease Paternal Grandmother    Diabetes Paternal Grandmother     Allergies  Allergies  Allergen Reactions   Valsartan-Hydrochlorothiazide Other (See Comments)    Renal failure     Current Outpatient Medications  Medication Sig Dispense Refill   amLODipine (NORVASC) 10 MG tablet Take 10 mg by mouth daily.  5   aspirin 81 MG tablet Take 81 mg by mouth daily.     diphenhydrAMINE (BENADRYL) 25 mg capsule Take 25 mg by mouth at bedtime.     dorzolamide-timolol (COSOPT) 22.3-6.8 MG/ML ophthalmic solution PLACE 1 DROP IN LEFT EYE TWICE A DAY     doxazosin (CARDURA) 4 MG tablet Take 4 mg by mouth daily.     ezetimibe-simvastatin (VYTORIN) 10-40 MG per tablet Take 1 tablet by mouth daily.     fenofibrate (TRICOR) 145 MG tablet Take 145 mg by mouth daily.     hydrochlorothiazide (HYDRODIURIL) 25 MG tablet Take 25 mg by mouth every morning.  11   traMADol (ULTRAM) 50 MG tablet Take 1 tablet (50 mg total) by mouth every 6 (six) hours as needed for moderate pain. For PAIN   PRN 20 tablet 0   VYZULTA 0.024 % SOLN INSTILL 1 DROP INTO BOTH EYES AT BEDTIME AS DIRECTED  6   No current facility-administered medications for this visit.    Physical Examination  Vitals:   10/26/22 0939  BP: (!) 160/76  Pulse: 75  Resp: 18  Temp: 98.2 F (36.8 C)  TempSrc: Temporal  SpO2: 93%  Weight: 217 lb 9.6 oz  (98.7 kg)  Height: 5\' 6"  (1.676 m)    Body mass index is 35.12 kg/m.  General:  Alert and oriented, no acute distress HEENT: Normal Neck: No bruit or JVD Pulmonary: Clear to auscultation bilaterally Cardiac: Regular Rate and Rhythm without murmur Abdomen: Soft, non-tender, non-distended, no mass, no scars Skin: No rash, no ischemic changes Extremity Pulses:   radial, femoral pulses bilaterally Musculoskeletal: No deformity, positive minimal edema B ankles no sign of venous insuffiencey    Neurologic: Upper and lower extremity motor 5/5 and symmetric  DATA:  ABI Findings:  +---------+------------------+-----+--------+--------+  Right   Rt Pressure (mmHg)IndexWaveformComment   +---------+------------------+-----+--------+--------+  Brachial 162                                      +---------+------------------+-----+--------+--------+  PTA     162               1.00 biphasic          +---------+------------------+-----+--------+--------+  DP      163               1.01 biphasic          +---------+------------------+-----+--------+--------+  Great Toe97                0.60                   +---------+------------------+-----+--------+--------+   +---------+------------------+-----+--------+-------+  Left    Lt Pressure (mmHg)IndexWaveformComment  +---------+------------------+-----+--------+-------+  Brachial 162                                     +---------+------------------+-----+--------+-------+  PTA     168               1.04 biphasic         +---------+------------------+-----+--------+-------+  DP      166               1.02 biphasic         +---------+------------------+-----+--------+-------+  Great Toe138               0.85                  +---------+------------------+-----+--------+-------+   +-------+-----------+-----------+------------+------------+  ABI/TBIToday's ABIToday's TBIPrevious  ABIPrevious TBI  +-------+-----------+-----------+------------+------------+  Right 1.01       0.60       0.89        0.58          +-------+-----------+-----------+------------+------------+  Left  1.04       0.85       1.09        0.74          +-------+-----------+-----------+------------+------------+    Right ABIs appear increased. Left ABIs appear essentially unchanged  compared to prior study on 10/15/2021.    Summary:  Right: Resting right ankle-brachial index is within normal range. The  right toe-brachial index is abnormal.   Left: Resting left ankle-brachial index is within normal range. The left  toe-brachial index is normal.   Aorta Graft: Aorta - Bifemoral  +------------------------+--------+--------+--------+--------------+                         PSV cm/sStenosisWaveformComments        +------------------------+--------+--------+--------+--------------+  Inflow                                         not visualized  +------------------------+--------+--------+--------+--------------+  Prox anastomosis        65                                      +------------------------+--------+--------+--------+--------------+  Proximal graft  76                                      +------------------------+--------+--------+--------+--------------+  Mid graft               94                                      +------------------------+--------+--------+--------+--------------+  Distal graft            114                                     +------------------------+--------+--------+--------+--------------+  Right Limb              94                                      +------------------------+--------+--------+--------+--------------+  Right Distal anastomosis135                                     +------------------------+--------+--------+--------+--------------+  Right Outflow           75                                       Right proximal SFA      241     50-74%                          +------------------------+--------+--------+--------+--------------+  Left limb prox          118                                     Left Limb mid           129                                     left limb distal        141                                     +------------------------+--------+--------+--------+--------------+  Left Distal anastomosis 124                                     +------------------------+--------+--------+--------+--------------+  Left Outflow            143                                     +------------------------+--------+--------+--------+--------------+      Summary:  Stenosis:  Patent aortobifemoral bypass graft with limited visualization of the  proximal anastomoses and inflow.  The right proximal SFA velocities continue to suggest a 50-74% stenosis.       ASSESSMENT/PLAN:  PAD with history of sever claudication symptoms B LE S/P aortobifemoral bypass performed in 2017 by Dr. Imogene Burn.             He has elevated velocities SFA B 50-74%.  Right SFA 244 from 225 PSV.  He remains asymptomatic for new PAD issues.  He denies rest pain, non healing wounds or claudication at this time.  We reviewed symptoms of ischemia and if these occur her will call.  His ABI's are stable with biphasic flow and 1.0 index B.   He will continue to walk daily and stay active.  Plan for repeat exam and studies in 1 year.      Mosetta Pigeon PA-C [facility] [place] Office: [phone]  MD in clinic [place]

## 2022-11-03 ENCOUNTER — Other Ambulatory Visit: Payer: Self-pay

## 2022-11-03 DIAGNOSIS — I779 Disorder of arteries and arterioles, unspecified: Secondary | ICD-10-CM

## 2022-11-03 DIAGNOSIS — Z95828 Presence of other vascular implants and grafts: Secondary | ICD-10-CM

## 2023-11-10 ENCOUNTER — Other Ambulatory Visit: Payer: Self-pay

## 2023-11-10 ENCOUNTER — Other Ambulatory Visit: Payer: Self-pay | Admitting: *Deleted

## 2023-11-10 DIAGNOSIS — I745 Embolism and thrombosis of iliac artery: Secondary | ICD-10-CM

## 2023-11-10 DIAGNOSIS — I779 Disorder of arteries and arterioles, unspecified: Secondary | ICD-10-CM

## 2023-11-10 DIAGNOSIS — Z95828 Presence of other vascular implants and grafts: Secondary | ICD-10-CM

## 2023-12-06 ENCOUNTER — Ambulatory Visit

## 2023-12-06 ENCOUNTER — Encounter (HOSPITAL_COMMUNITY)

## 2024-01-17 ENCOUNTER — Ambulatory Visit

## 2024-02-20 ENCOUNTER — Encounter: Payer: Self-pay | Admitting: Dermatology

## 2024-02-20 ENCOUNTER — Ambulatory Visit: Admitting: Dermatology

## 2024-02-20 VITALS — BP 152/73

## 2024-02-20 DIAGNOSIS — D485 Neoplasm of uncertain behavior of skin: Secondary | ICD-10-CM | POA: Diagnosis not present

## 2024-02-20 DIAGNOSIS — D044 Carcinoma in situ of skin of scalp and neck: Secondary | ICD-10-CM

## 2024-02-20 DIAGNOSIS — B079 Viral wart, unspecified: Secondary | ICD-10-CM

## 2024-02-20 NOTE — Progress Notes (Unsigned)
 New Patient Visit   Subjective  Robert [name] is a 73 y.o. male who presents for the following: Bump on his scalp that has been there for over a year. His barber has hit it with clppers. It does not get sore.  He has some skin tags of his eyelids that get irritated. He seems to be getting more of them.  The following portions of the chart were reviewed this encounter and updated as appropriate: medications, allergies, medical history  Review of Systems:  No other skin or systemic complaints except as noted in HPI or Assessment and Plan.  Objective  Well appearing patient in no apparent distress; mood and affect are within normal limits.   A focused examination was performed of the following areas: Scalp   Relevant exam findings are noted in the Assessment and Plan.    Scalp 1.0 cm pink crusted papule  Right Upper Eyelid x 5, left upper and lower eyelid x 2, left cheek x 1 (8) Filaform verrucous papules  Assessment & Plan   Scalp lesion, biopsy to rule out skin cancer Scalp lesion approximately one centimeter in size, pink and crested, with differential diagnosis including squamous cell carcinoma (SCC) and actinic keratosis (HAK). No history of trauma or bleeding. Biopsy planned to rule out skin cancer. - Performed shave biopsy of scalp lesion - Sent biopsy to lab for analysis - Applied Aquaphor to biopsy site - Will send biopsy results via MyChart in about a week  Filiform warts of right eyelid and right cheek Filiform warts present on right upper and lower eyelid and right cheek. Warts are bothersome, especially those on the eyelid. Cryotherapy planned for removal. - Performed cryotherapy on filiform warts - Applied Aquaphor to treated areas - Advised that warts will crust and fall off within one to two weeks  NEOPLASM OF UNCERTAIN BEHAVIOR OF SKIN Scalp Skin / nail biopsy Type of biopsy: tangential   Informed consent: discussed and consent obtained   Timeout:  patient name, date of birth, surgical site, and procedure verified   Procedure prep:  Patient was prepped and draped in usual sterile fashion Prep type:  Isopropyl alcohol Anesthesia: the lesion was anesthetized in a standard fashion   Anesthetic:  1% lidocaine  w/ epinephrine  1-100,000 buffered w/ 8.4% NaHCO3 Instrument used: flexible razor blade   Hemostasis achieved with: pressure, aluminum chloride and electrodesiccation   Outcome: patient tolerated procedure well   Post-procedure details: sterile dressing applied and wound care instructions given   Dressing type: bandage and petrolatum    Specimen 1 - Surgical pathology Differential Diagnosis: Hyperkeratotic AK vs SCC vs other   Check Margins: No VIRAL WARTS, UNSPECIFIED TYPE (8) Right Upper Eyelid x 5, left upper and lower eyelid x 2, left cheek x 1 (8) Destruction of lesion - Right Upper Eyelid x 5, left upper and lower eyelid x 2, left cheek x 1 (8) Complexity: simple   Destruction method: cryotherapy   Informed consent: discussed and consent obtained   Timeout:  patient name, date of birth, surgical site, and procedure verified Lesion destroyed using liquid nitrogen: Yes   Region frozen until ice ball extended beyond lesion: Yes   Outcome: patient tolerated procedure well with no complications   Post-procedure details: wound care instructions given     Return in about 4 months (around 06/19/2024) for TBSE.  I, Roseline Hutchinson, CMA, am acting as scribe for [employer], DO .   Documentation: I have reviewed the above documentation for accuracy  and completeness, and I agree with the above.  Delon Lenis, DO

## 2024-02-20 NOTE — Patient Instructions (Addendum)

## 2024-02-22 LAB — SURGICAL PATHOLOGY

## 2024-02-27 ENCOUNTER — Ambulatory Visit: Payer: Self-pay | Admitting: Dermatology

## 2024-02-27 DIAGNOSIS — D099 Carcinoma in situ, unspecified: Secondary | ICD-10-CM | POA: Insufficient documentation

## 2024-02-27 NOTE — Progress Notes (Signed)
 Please call pt and notify their bx results were positive for a skin CA that will be treated by Dr. Alm     1. Skin, scalp :   ED&C w/ Dr Alm (any appointment slot)      SQUAMOUS CELL CARCINOMA IN SITU

## 2024-03-13 ENCOUNTER — Ambulatory Visit (HOSPITAL_COMMUNITY)
Admission: RE | Admit: 2024-03-13 | Discharge: 2024-03-13 | Disposition: A | Source: Ambulatory Visit | Attending: Vascular Surgery

## 2024-03-13 ENCOUNTER — Ambulatory Visit: Admitting: Physician Assistant

## 2024-03-13 VITALS — BP 160/75 | HR 80 | Temp 98.2°F | Wt 214.9 lb

## 2024-03-13 DIAGNOSIS — I779 Disorder of arteries and arterioles, unspecified: Secondary | ICD-10-CM

## 2024-03-13 DIAGNOSIS — I745 Embolism and thrombosis of iliac artery: Secondary | ICD-10-CM | POA: Diagnosis not present

## 2024-03-13 DIAGNOSIS — Z95828 Presence of other vascular implants and grafts: Secondary | ICD-10-CM | POA: Diagnosis not present

## 2024-03-13 LAB — VAS US ABI WITH/WO TBI
Left ABI: 1.02
Right ABI: 1

## 2024-03-13 NOTE — Progress Notes (Signed)
 Office Note     CC:  follow up Requesting Provider:  Arloa Elsie SAUNDERS, MD  HPI: Robert [name] is a 73 y.o. (1951-03-12) male who presents for surveillance of PAD. He has a remote history of an aortobifemoral bypass with bilateral iliofemoral endarterectomies in 2017 by Dr. Laurence. This was complicated by development of a fluid collection in the right groin in May of 2018. Aside from that complication, once he healed he has had no further issues.   Today he reports overall doing well. No complaints today. He denies any abdominal pain or new back pain. Says he has chronic low back pain but this has not changed. He remains very active says he continues to do his yard work and tries to walk 2-3 days a week at the park. He denies any pain in his legs on ambulation or rest. No tissue loss. He is medically managed on Aspirin  and Statin.   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   scalp - EDC needed     Past Surgical History:  Procedure Laterality Date   AORTA - BILATERAL FEMORAL ARTERY BYPASS GRAFT N/A 05/21/2015   Procedure: AORTOBIFEMORAL BYPASS GRAFT USING HEMASHIELD GOLD 14X8MM X 40CM VASCULAR GRAFT;  Surgeon: Redell LITTIE Laurence, MD;  Location: [facility] OR;  Service: Vascular;  Laterality: N/A;   APPLICATION OF WOUND VAC Right 08/28/2016   Procedure: APPLICATION OF WOUND VAC;  Surgeon: Laurence Redell LITTIE, MD;  Location: [facility] OR;  Service: Vascular;  Laterality: Right;   AXILLARY-FEMORAL BYPASS GRAFT Right 08/28/2016   Procedure: RIGHT GROIN EXPLORATION , IRRIGATION AND DEBRIDEMENT OF RIGHT GROIN FLUID  COLLECTION,  Placement of Antibiotic Beads VAC PLACEMENT;  Surgeon: Laurence Redell LITTIE, MD;  Location: [facility] OR;  Service: Vascular;  Laterality: Right;   ENDARTERECTOMY FEMORAL Bilateral 05/21/2015   Procedure: ENDARTERECTOMY BILATERAL FEMORAL ARTERIES;  Surgeon: Redell LITTIE Laurence, MD;  Location: [facility] OR;  Service: Vascular;  Laterality: Bilateral;   I & D EXTREMITY Right 09/07/2016   Procedure: RIGHT GROIN WASHOUT WITH REMOVAL BEADS AND WOUND VAC PLACEMENT;  Surgeon: Laurence Redell LITTIE, MD;  Location: [facility] OR;  Service: Vascular;  Laterality: Right;   INSERTION OF DIALYSIS CATHETER N/A 05/23/2015   Procedure: INSERTION OF DIALYSIS CATHETER left internal jugular;  Surgeon: Lonni GORMAN Blade, MD;  Location: [facility] OR;  Service: Vascular;  Laterality: N/A;   IR FLUORO GUIDE CV LINE RIGHT  08/31/2016   IR REMOVAL TUN CV CATH W/O FL  10/12/2016   IR US  GUIDE VASC ACCESS RIGHT  08/31/2016   PATCH ANGIOPLASTY Left 05/21/2015   Procedure: LEFT FEMORAL ARTERY PATCH ANGIOPLASTY USING XENOSURE BIOLOGIC PATCH;  Surgeon: Redell LITTIE Laurence, MD;  Location: [facility] OR;  Service: Vascular;  Laterality: Left;   PERIPHERAL VASCULAR CATHETERIZATION N/A 03/13/2015   Procedure: Abdominal Aortogram;  Surgeon: Redell LITTIE Laurence, MD;  Location: [facility];  Service: Cardiovascular;  Laterality: N/A;    Social History   Socioeconomic History  Marital status: Married    Spouse name: Not on file   Number of children: Not on file   Years of education: Not on file   Highest education level: Not on file  Occupational History   Not on file  Tobacco Use   Smoking status: Former    Passive exposure: Never   Smokeless tobacco: Never   Tobacco comments:    quit smoking 11 yrs ago  Vaping Use   Vaping status: Never Used  Substance and Sexual Activity   Alcohol use: Yes    Alcohol/week: 3.0 standard drinks of alcohol    Types: 3 Cans of beer per week    Comment: couple of beers a day   Drug use: No   Sexual activity: Not on file  Other Topics  Concern   Not on file  Social History Narrative   Epworth Sleepiness Scale = (as of 04/10/2015)   Social Drivers of Corporate Investment Banker Strain: Not on file  Food Insecurity: Not on file  Transportation Needs: Not on file  Physical Activity: Not on file  Stress: Not on file  Social Connections: Not on file  Intimate Partner Violence: Not on file    Family History  Problem Relation Age of Onset   Hyperlipidemia Father    Peripheral vascular disease Father        amputation   Cancer Brother        colon/bowel cancer   Hyperlipidemia Brother    Cancer Mother    Liver disease Mother    Cancer Maternal Grandmother    Heart disease Paternal Grandmother    Diabetes Paternal Grandmother     Current Outpatient Medications  Medication Sig Dispense Refill   amLODipine (NORVASC) 10 MG tablet Take 10 mg by mouth daily.  5   aspirin  81 MG tablet Take 81 mg by mouth daily.     diphenhydrAMINE  (BENADRYL ) 25 mg capsule Take 25 mg by mouth at bedtime.     dorzolamide-timolol (COSOPT) 22.3-6.8 MG/ML ophthalmic solution PLACE 1 DROP IN LEFT EYE TWICE A DAY     doxazosin (CARDURA) 4 MG tablet Take 4 mg by mouth daily.     ezetimibe -simvastatin  (VYTORIN ) 10-40 MG per tablet Take 1 tablet by mouth daily.     fenofibrate  (TRICOR ) 145 MG tablet Take 145 mg by mouth daily.     hydrochlorothiazide  (HYDRODIURIL ) 25 MG tablet Take 25 mg by mouth every morning.  11   traMADol  (ULTRAM ) 50 MG tablet Take 1 tablet (50 mg total) by mouth every 6 (six) hours as needed for moderate pain. For PAIN   PRN 20 tablet 0   VYZULTA 0.024 % SOLN INSTILL 1 DROP INTO BOTH EYES AT BEDTIME AS DIRECTED  6   No current facility-administered medications for this visit.    Allergies  Allergen Reactions   Valsartan -Hydrochlorothiazide  Other (See Comments)    Renal failure     REVIEW OF SYSTEMS:  Negative unless noted in HPI [X]  denotes positive finding, [ ]  denotes negative finding Cardiac  Comments:   Chest pain or chest pressure:    Shortness of breath upon exertion:    Short of breath when lying flat:    Irregular heart rhythm:        Vascular    Pain in calf, thigh, or hip brought on by ambulation:    Pain in feet at night that wakes you up from your sleep:     Blood clot in your veins:  Leg swelling:         Pulmonary    Oxygen at home:    Productive cough:     Wheezing:         Neurologic    Sudden weakness in arms or legs:     Sudden numbness in arms or legs:     Sudden onset of difficulty speaking or slurred speech:    Temporary loss of vision in one eye:     Problems with dizziness:         Gastrointestinal    Blood in stool:     Vomited blood:         Genitourinary    Burning when urinating:     Blood in urine:        Psychiatric    Major depression:         Hematologic    Bleeding problems:    Problems with blood clotting too easily:        Skin    Rashes or ulcers:        Constitutional    Fever or chills:      PHYSICAL EXAMINATION:  Vitals:   03/13/24 0930  BP: (!) 160/75  Pulse: 80  Temp: 98.2 F (36.8 C)  TempSrc: Temporal  Weight: 214 lb 14.4 oz (97.5 kg)    General:  WDWN in NAD; vital signs documented above Gait: Normal  HENT: WNL, normocephalic Pulmonary: normal non-labored breathing Cardiac: regular HR Abdomen: soft Vascular Exam/Pulses: 2+ femoral, 2+ DP pulses bilaterally. Feet warm and well perfused Extremities: without ischemic changes, without Gangrene , without cellulitis; without open wounds;  Musculoskeletal: no muscle wasting or atrophy  Neurologic: A&O X 3 Psychiatric:  The pt has Normal affect.   Non-Invasive Vascular Imaging:   +-------+-----------+-----------+------------+------------+  ABI/TBIToday's ABIToday's TBIPrevious ABIPrevious TBI  +-------+-----------+-----------+------------+------------+  Right 1.00       0.55       1.01        0.60           +-------+-----------+-----------+------------+------------+  Left  1.02       0.69       1.04        0.85          +-------+-----------+-----------+------------+------------+   VAS US  Aorta/IVC/Iliacs complete: Summary:  Stenosis: Patent aorto-bi-femoral bypass graft without evidence of significant stenosis.  IVC/Iliac: There is no evidence of thrombus involving the IVC.    ASSESSMENT/PLAN:: 73 y.o. male here for surveillance of PAD. He has a remote history of an aortobifemoral bypass with bilateral iliofemoral endarterectomies in 2017 by Dr. Laurence. This was complicated by development of a fluid collection in the right groin in May of 2018. He has had no issues since he healed following this. He is without any abdominal pain or back pain. He does not have any claudication, rest pain or tissue loss - ABIs are stable bilaterally - Duplex shows patent Aorto-bifemoral bypass without stenosis - He will continue Aspirin  - encourage him to continue his exercise regimen -Follow up in 1 year with repeat Aorto/iliac/ IVC duplex and ABI's   Teretha Damme, PA-C [facility] [phone]  Clinic MD: Magda

## 2024-03-20 ENCOUNTER — Encounter: Payer: Self-pay | Admitting: Dermatology

## 2024-03-20 ENCOUNTER — Ambulatory Visit: Admitting: Dermatology

## 2024-03-20 VITALS — BP 135/65 | HR 82

## 2024-03-20 DIAGNOSIS — B079 Viral wart, unspecified: Secondary | ICD-10-CM

## 2024-03-20 DIAGNOSIS — D099 Carcinoma in situ, unspecified: Secondary | ICD-10-CM

## 2024-03-20 NOTE — Patient Instructions (Addendum)
 [facility] Aftercare  Wash gently with soap and water everyday.   Apply Vaseline and Band-Aid daily until healed.    Taking Care of Your Site  Proper care of the site is essential for promoting healing and minimizing scarring. This handout provides instructions on how to care for your biopsy site to ensure optimal recovery.  1. Cleaning the Wound:  Clean thesite daily with gentle soap and water. Gently pat the area dry with a clean, soft towel. Avoid harsh scrubbing or rubbing the area, as this can irritate the skin and delay healing.  2. Applying Aquaphor and Bandage:  After cleaning the wound, apply a thin layer of Aquaphor ointment to the site. Cover the area with a sterile bandage to protect it from dirt, bacteria, and friction. Change the bandage daily or as needed if it becomes soiled or wet.  3. Continued Care for One Week:  Repeat the cleaning, Aquaphor application, and bandaging process daily for one week following the procedure. Keeping the wound clean and moist during this initial healing period will help prevent infection and promote optimal healing.  4. Massaging Aquaphor into the Area:  ---After one week, discontinue the use of bandages but continue to apply Aquaphor to the site. ----Gently massage the Aquaphor into the area using circular motions. ---Massaging the skin helps to promote circulation and prevent the formation of scar tissue.   Additional Tips:  Avoid exposing the biopsy site to direct sunlight during the healing process, as this can cause hyperpigmentation or worsen scarring. If you experience any signs of infection, such as increased redness, swelling, warmth, or drainage from the wound, contact your healthcare provider immediately. Follow any additional instructions provided by your healthcare provider for caring for the biopsy site and managing any discomfort. Conclusion:  Taking proper care of your skin biopsy site is crucial for ensuring optimal  healing and minimizing scarring. By following these instructions for cleaning, applying Aquaphor, and massaging the area, you can promote a smooth and successful recovery. If you have any questions or concerns about caring for your biopsy site, don't hesitate to contact your healthcare provider for guidance.      Important Information  Due to recent changes in healthcare laws, you may see results of your pathology and/or laboratory studies on MyChart before the doctors have had a chance to review them. We understand that in some cases there may be results that are confusing or concerning to you. Please understand that not all results are received at the same time and often the doctors may need to interpret multiple results in order to provide you with the best plan of care or course of treatment. Therefore, we ask that you please give us  2 business days to thoroughly review all your results before contacting the office for clarification. Should we see a critical lab result, you will be contacted sooner.   If You Need Anything After Your Visit  If you have any questions or concerns for your doctor, please call our main line at [phone] If no one answers, please leave a voicemail as directed and we will return your call as soon as possible. Messages left after 4 pm will be answered the following business day.   You may also send us  a message via MyChart. We typically respond to MyChart messages within 1-2 business days.  For prescription refills, please ask your pharmacy to contact our office. Our fax number is [phone].  If you have an urgent issue when the clinic is closed  that cannot wait until the next business day, you can page your doctor at the number below.    Please note that while we do our best to be available for urgent issues outside of office hours, we are not available 24/7.   If you have an urgent issue and are unable to reach us , you may choose to seek medical care at your  doctor's office, retail clinic, urgent care center, or emergency room.  If you have a medical emergency, please immediately call 911 or go to the emergency department. In the event of inclement weather, please call our main line at [phone] for an update on the status of any delays or closures.  Dermatology Medication Tips: Please keep the boxes that topical medications come in in order to help keep track of the instructions about where and how to use these. Pharmacies typically print the medication instructions only on the boxes and not directly on the medication tubes.   If your medication is too expensive, please contact our office at [phone] or send us  a message through MyChart.   We are unable to tell what your co-pay for medications will be in advance as this is different depending on your insurance coverage. However, we may be able to find a substitute medication at lower cost or fill out paperwork to get insurance to cover a needed medication.   If a prior authorization is required to get your medication covered by your insurance company, please allow us  1-2 business days to complete this process.  Drug prices often vary depending on where the prescription is filled and some pharmacies may offer cheaper prices.  The website www.goodrx.com contains coupons for medications through different pharmacies. The prices here do not account for what the cost may be with help from insurance (it may be cheaper with your insurance), but the website can give you the price if you did not use any insurance.  - You can print the associated coupon and take it with your prescription to the pharmacy.  - You may also stop by our office during regular business hours and pick up a GoodRx coupon card.  - If you need your prescription sent electronically to a different pharmacy, notify our office through [name] or by phone at [phone]

## 2024-03-20 NOTE — Progress Notes (Signed)
° °  Follow-Up Visit  Patient (and/or pt guardian) consented to the use of AI-assisted tools for note generation.   Subjective  Robert [name] is a 73 y.o. male who presents for the following: Electrodesiccation and curretage of SCCIS on the scalp  Patient was last evaluated on 02/20/24.  At this visit a bx was performed on the patient's scalp with a diagnosis of SCCIS Patient denies medication changes.  The following portions of the chart were reviewed this encounter and updated as appropriate: medications, allergies, medical history  Review of Systems:  No other skin or systemic complaints except as noted in HPI or Assessment and Plan.  Patient would like to discuss viral warts from last   Objective  Well appearing patient in no apparent distress; mood and affect are within normal limits.   A focused examination was performed of the following areas: Scalp  Relevant exam findings are noted in the Assessment and Plan.  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 Requires further treatment to remove the base to prevent recurrence. - Performed scraping with 3 mm margins to remove the base of the carcinoma. - Applied Aquaphor to the area for 7-10 days to aid healing. - Provided printout of post-procedure care instructions.  Viral warts of face Persistent viral warts on the face, some of which have fallen off after previous treatment. Requires additional cryotherapy to ensure complete removal. - Performed cryotherapy on remaining warts on the face. VIRAL WARTS, UNSPECIFIED TYPE (8) Right Upper Eyelid x 5, left upper and lower eyelid x 2, left cheek x 1 (8) Destruction of lesion - Right Upper Eyelid x 5, left upper and lower eyelid x 2, left cheek x 1  (8) Complexity: simple   Destruction method: cryotherapy   Informed consent: discussed and consent obtained   Timeout:  patient name, date of birth, surgical site, and procedure verified Cryotherapy cycles:  7 Outcome: patient tolerated procedure well with no complications   Post-procedure details: wound care instructions given    SQUAMOUS CELL CARCINOMA IN SITU (SCCIS) Mid Frontal Scalp Destruction of lesion Complexity: simple   Destruction method: electrodesiccation and curettage   Informed consent: discussed and consent obtained   Timeout:  patient name, date of birth, surgical site, and procedure verified Procedure prep:  Patient was prepped and draped in usual sterile fashion Prep type:  Isopropyl alcohol Anesthesia: the lesion was anesthetized in a standard fashion   Anesthetic:  1% lidocaine  w/ epinephrine  1-100,000 local infiltration Curettage performed in three different directions: Yes   Electrodesiccation performed over the curetted area: Yes   Curettage cycles:  3 Lesion width (cm):  0.8 Margin per side (cm):  0.3 Final wound size (cm):  1.4 Hemostasis achieved with:  electrodesiccation Outcome: patient tolerated procedure well with no complications   Post-procedure details: wound care instructions given     Return if symptoms worsen or fail to improve.  I, Robert [name], as acting as a neurosurgeon for [employer], DO .   Documentation: I have reviewed the above documentation for accuracy and completeness, and I agree with the above.  Robert Lenis, DO

## 2024-08-13 ENCOUNTER — Ambulatory Visit: Admitting: Dermatology
